# Patient Record
Sex: Female | Born: 1950 | Race: White | Hispanic: No | Marital: Married | Smoking: Never smoker
Health system: Southern US, Community
[De-identification: ages and names within clinical notes are randomized; demographics above are authoritative.]

## PROBLEM LIST (undated history)

## (undated) DIAGNOSIS — M810 Age-related osteoporosis without current pathological fracture: Secondary | ICD-10-CM

## (undated) DIAGNOSIS — N3941 Urge incontinence: Secondary | ICD-10-CM

## (undated) DIAGNOSIS — R112 Nausea with vomiting, unspecified: Secondary | ICD-10-CM

## (undated) DIAGNOSIS — K222 Esophageal obstruction: Secondary | ICD-10-CM

## (undated) DIAGNOSIS — K219 Gastro-esophageal reflux disease without esophagitis: Secondary | ICD-10-CM

## (undated) DIAGNOSIS — H269 Unspecified cataract: Secondary | ICD-10-CM

## (undated) DIAGNOSIS — D649 Anemia, unspecified: Secondary | ICD-10-CM

## (undated) DIAGNOSIS — F411 Generalized anxiety disorder: Secondary | ICD-10-CM

## (undated) DIAGNOSIS — I498 Other specified cardiac arrhythmias: Secondary | ICD-10-CM

## (undated) DIAGNOSIS — M722 Plantar fascial fibromatosis: Secondary | ICD-10-CM

## (undated) DIAGNOSIS — Z9889 Other specified postprocedural states: Secondary | ICD-10-CM

## (undated) HISTORY — DX: Unspecified cataract: H26.9

## (undated) HISTORY — DX: Generalized anxiety disorder: F41.1

## (undated) HISTORY — PX: HERNIA REPAIR: SHX51

## (undated) HISTORY — DX: Other specified cardiac arrhythmias: I49.8

## (undated) HISTORY — DX: Urge incontinence: N39.41

## (undated) HISTORY — DX: Gastro-esophageal reflux disease without esophagitis: K21.9

## (undated) HISTORY — PX: VAGINAL HYSTERECTOMY: SUR661

## (undated) HISTORY — DX: Esophageal obstruction: K22.2

## (undated) HISTORY — DX: Plantar fascial fibromatosis: M72.2

## (undated) HISTORY — DX: Anemia, unspecified: D64.9

---

## 1998-08-09 ENCOUNTER — Emergency Department (HOSPITAL_COMMUNITY): Admission: EM | Admit: 1998-08-09 | Discharge: 1998-08-09 | Payer: Self-pay | Admitting: Emergency Medicine

## 1998-08-25 ENCOUNTER — Encounter: Payer: Self-pay | Admitting: *Deleted

## 1998-08-25 ENCOUNTER — Ambulatory Visit (HOSPITAL_COMMUNITY): Admission: RE | Admit: 1998-08-25 | Discharge: 1998-08-25 | Payer: Self-pay | Admitting: *Deleted

## 1998-12-14 ENCOUNTER — Other Ambulatory Visit: Admission: RE | Admit: 1998-12-14 | Discharge: 1998-12-14 | Payer: Self-pay | Admitting: *Deleted

## 1999-04-05 ENCOUNTER — Ambulatory Visit (HOSPITAL_BASED_OUTPATIENT_CLINIC_OR_DEPARTMENT_OTHER): Admission: RE | Admit: 1999-04-05 | Discharge: 1999-04-05 | Payer: Self-pay | Admitting: Surgery

## 1999-09-13 ENCOUNTER — Encounter: Payer: Self-pay | Admitting: *Deleted

## 1999-09-13 ENCOUNTER — Encounter: Admission: RE | Admit: 1999-09-13 | Discharge: 1999-09-13 | Payer: Self-pay | Admitting: *Deleted

## 1999-12-24 ENCOUNTER — Other Ambulatory Visit: Admission: RE | Admit: 1999-12-24 | Discharge: 1999-12-24 | Payer: Self-pay | Admitting: *Deleted

## 2000-09-03 ENCOUNTER — Encounter: Payer: Self-pay | Admitting: *Deleted

## 2000-09-03 ENCOUNTER — Encounter: Admission: RE | Admit: 2000-09-03 | Discharge: 2000-09-03 | Payer: Self-pay | Admitting: *Deleted

## 2000-12-09 ENCOUNTER — Encounter: Payer: Self-pay | Admitting: *Deleted

## 2000-12-09 ENCOUNTER — Encounter: Admission: RE | Admit: 2000-12-09 | Discharge: 2000-12-09 | Payer: Self-pay | Admitting: *Deleted

## 2000-12-09 ENCOUNTER — Other Ambulatory Visit: Admission: RE | Admit: 2000-12-09 | Discharge: 2000-12-09 | Payer: Self-pay | Admitting: *Deleted

## 2001-09-08 ENCOUNTER — Encounter: Admission: RE | Admit: 2001-09-08 | Discharge: 2001-09-08 | Payer: Self-pay | Admitting: *Deleted

## 2001-09-08 ENCOUNTER — Encounter: Payer: Self-pay | Admitting: *Deleted

## 2001-12-01 ENCOUNTER — Other Ambulatory Visit: Admission: RE | Admit: 2001-12-01 | Discharge: 2001-12-01 | Payer: Self-pay | Admitting: *Deleted

## 2002-02-12 ENCOUNTER — Ambulatory Visit (HOSPITAL_BASED_OUTPATIENT_CLINIC_OR_DEPARTMENT_OTHER): Admission: RE | Admit: 2002-02-12 | Discharge: 2002-02-12 | Payer: Self-pay | Admitting: Surgery

## 2002-09-21 ENCOUNTER — Encounter: Payer: Self-pay | Admitting: *Deleted

## 2002-09-21 ENCOUNTER — Encounter: Admission: RE | Admit: 2002-09-21 | Discharge: 2002-09-21 | Payer: Self-pay | Admitting: *Deleted

## 2002-12-14 ENCOUNTER — Other Ambulatory Visit: Admission: RE | Admit: 2002-12-14 | Discharge: 2002-12-14 | Payer: Self-pay | Admitting: *Deleted

## 2003-10-27 ENCOUNTER — Encounter: Admission: RE | Admit: 2003-10-27 | Discharge: 2003-10-27 | Payer: Self-pay | Admitting: *Deleted

## 2003-12-06 ENCOUNTER — Other Ambulatory Visit: Admission: RE | Admit: 2003-12-06 | Discharge: 2003-12-06 | Payer: Self-pay | Admitting: *Deleted

## 2004-12-12 ENCOUNTER — Other Ambulatory Visit: Admission: RE | Admit: 2004-12-12 | Discharge: 2004-12-12 | Payer: Self-pay | Admitting: *Deleted

## 2004-12-17 ENCOUNTER — Ambulatory Visit (HOSPITAL_COMMUNITY): Admission: RE | Admit: 2004-12-17 | Discharge: 2004-12-17 | Payer: Self-pay | Admitting: *Deleted

## 2005-02-21 ENCOUNTER — Ambulatory Visit: Payer: Self-pay | Admitting: Internal Medicine

## 2005-03-14 ENCOUNTER — Ambulatory Visit: Payer: Self-pay | Admitting: Internal Medicine

## 2005-03-14 ENCOUNTER — Ambulatory Visit: Payer: Self-pay

## 2005-03-14 HISTORY — PX: OTHER SURGICAL HISTORY: SHX169

## 2005-06-10 ENCOUNTER — Encounter (INDEPENDENT_AMBULATORY_CARE_PROVIDER_SITE_OTHER): Payer: Self-pay | Admitting: *Deleted

## 2005-06-10 ENCOUNTER — Observation Stay (HOSPITAL_COMMUNITY): Admission: RE | Admit: 2005-06-10 | Discharge: 2005-06-11 | Payer: Self-pay | Admitting: *Deleted

## 2006-01-29 ENCOUNTER — Other Ambulatory Visit: Admission: RE | Admit: 2006-01-29 | Discharge: 2006-01-29 | Payer: Self-pay | Admitting: *Deleted

## 2006-01-29 ENCOUNTER — Ambulatory Visit (HOSPITAL_COMMUNITY): Admission: RE | Admit: 2006-01-29 | Discharge: 2006-01-29 | Payer: Self-pay | Admitting: *Deleted

## 2006-03-19 ENCOUNTER — Ambulatory Visit: Payer: Self-pay | Admitting: Internal Medicine

## 2006-10-21 ENCOUNTER — Ambulatory Visit: Payer: Self-pay | Admitting: Internal Medicine

## 2006-11-25 LAB — CONVERTED CEMR LAB: Pap Smear: NORMAL

## 2007-02-02 ENCOUNTER — Encounter: Admission: RE | Admit: 2007-02-02 | Discharge: 2007-02-02 | Payer: Self-pay | Admitting: *Deleted

## 2007-04-15 ENCOUNTER — Other Ambulatory Visit: Admission: RE | Admit: 2007-04-15 | Discharge: 2007-04-15 | Payer: Self-pay | Admitting: *Deleted

## 2007-04-26 HISTORY — PX: OTHER SURGICAL HISTORY: SHX169

## 2007-05-18 ENCOUNTER — Ambulatory Visit: Payer: Self-pay | Admitting: Internal Medicine

## 2007-05-18 LAB — CONVERTED CEMR LAB
BUN: 16 mg/dL (ref 6–23)
Basophils Relative: 0.1 % (ref 0.0–1.0)
CO2: 31 meq/L (ref 19–32)
GFR calc Af Amer: 95 mL/min
Glucose, Bld: 95 mg/dL (ref 70–99)
Hemoglobin: 13 g/dL (ref 12.0–15.0)
Lymphocytes Relative: 35.4 % (ref 12.0–46.0)
Monocytes Absolute: 0.5 10*3/uL (ref 0.2–0.7)
Monocytes Relative: 8.7 % (ref 3.0–11.0)
Neutro Abs: 2.9 10*3/uL (ref 1.4–7.7)
Potassium: 4.1 meq/L (ref 3.5–5.1)
Prothrombin Time: 11.1 s (ref 10.0–14.0)

## 2007-05-21 ENCOUNTER — Ambulatory Visit (HOSPITAL_COMMUNITY): Admission: RE | Admit: 2007-05-21 | Discharge: 2007-05-22 | Payer: Self-pay | Admitting: Internal Medicine

## 2007-05-21 ENCOUNTER — Ambulatory Visit: Payer: Self-pay | Admitting: Internal Medicine

## 2007-07-15 ENCOUNTER — Ambulatory Visit: Payer: Self-pay | Admitting: Internal Medicine

## 2008-02-16 ENCOUNTER — Encounter: Admission: RE | Admit: 2008-02-16 | Discharge: 2008-02-16 | Payer: Self-pay | Admitting: *Deleted

## 2008-10-03 ENCOUNTER — Ambulatory Visit: Payer: Self-pay | Admitting: Internal Medicine

## 2008-10-17 ENCOUNTER — Ambulatory Visit: Payer: Self-pay | Admitting: Internal Medicine

## 2008-10-17 LAB — HM COLONOSCOPY

## 2009-02-17 DIAGNOSIS — I498 Other specified cardiac arrhythmias: Secondary | ICD-10-CM

## 2009-02-17 DIAGNOSIS — Z9189 Other specified personal risk factors, not elsewhere classified: Secondary | ICD-10-CM | POA: Insufficient documentation

## 2009-03-01 ENCOUNTER — Ambulatory Visit: Payer: Self-pay | Admitting: Internal Medicine

## 2009-03-01 DIAGNOSIS — J019 Acute sinusitis, unspecified: Secondary | ICD-10-CM

## 2009-03-01 DIAGNOSIS — N3941 Urge incontinence: Secondary | ICD-10-CM

## 2009-06-06 ENCOUNTER — Telehealth: Payer: Self-pay | Admitting: Internal Medicine

## 2009-06-14 ENCOUNTER — Encounter: Admission: RE | Admit: 2009-06-14 | Discharge: 2009-06-14 | Payer: Self-pay | Admitting: Internal Medicine

## 2009-06-14 ENCOUNTER — Encounter: Payer: Self-pay | Admitting: Internal Medicine

## 2009-08-30 ENCOUNTER — Ambulatory Visit: Payer: Self-pay | Admitting: Internal Medicine

## 2009-08-30 LAB — CONVERTED CEMR LAB
ALT: 26 units/L (ref 0–35)
AST: 25 units/L (ref 0–37)
Albumin: 3.9 g/dL (ref 3.5–5.2)
Alkaline Phosphatase: 62 units/L (ref 39–117)
BUN: 16 mg/dL (ref 6–23)
Bilirubin Urine: NEGATIVE
CO2: 31 meq/L (ref 19–32)
Calcium: 9.2 mg/dL (ref 8.4–10.5)
Creatinine, Ser: 0.8 mg/dL (ref 0.4–1.2)
Eosinophils Relative: 3.5 % (ref 0.0–5.0)
Glucose, Bld: 94 mg/dL (ref 70–99)
HCT: 41.5 % (ref 36.0–46.0)
Hemoglobin: 14 g/dL (ref 12.0–15.0)
Ketones, ur: NEGATIVE mg/dL
Leukocytes, UA: NEGATIVE
Lymphs Abs: 1.9 10*3/uL (ref 0.7–4.0)
Monocytes Relative: 10.6 % (ref 3.0–12.0)
Neutro Abs: 2.4 10*3/uL (ref 1.4–7.7)
RBC: 4.34 M/uL (ref 3.87–5.11)
TSH: 1 microintl units/mL (ref 0.35–5.50)
Total CHOL/HDL Ratio: 3
Urine Glucose: NEGATIVE mg/dL
WBC: 5 10*3/uL (ref 4.5–10.5)
pH: 6 (ref 5.0–8.0)

## 2009-09-06 ENCOUNTER — Ambulatory Visit: Payer: Self-pay | Admitting: Internal Medicine

## 2009-09-06 DIAGNOSIS — F411 Generalized anxiety disorder: Secondary | ICD-10-CM | POA: Insufficient documentation

## 2009-09-06 DIAGNOSIS — M722 Plantar fascial fibromatosis: Secondary | ICD-10-CM | POA: Insufficient documentation

## 2010-01-23 ENCOUNTER — Ambulatory Visit: Payer: Self-pay | Admitting: Internal Medicine

## 2010-06-15 ENCOUNTER — Ambulatory Visit: Payer: Self-pay | Admitting: Internal Medicine

## 2010-06-15 DIAGNOSIS — R609 Edema, unspecified: Secondary | ICD-10-CM | POA: Insufficient documentation

## 2010-06-15 DIAGNOSIS — IMO0001 Reserved for inherently not codable concepts without codable children: Secondary | ICD-10-CM | POA: Insufficient documentation

## 2010-06-18 LAB — CONVERTED CEMR LAB
ALT: 24 units/L (ref 0–35)
AST: 27 units/L (ref 0–37)
Albumin: 3.9 g/dL (ref 3.5–5.2)
Alkaline Phosphatase: 62 units/L (ref 39–117)
BUN: 15 mg/dL (ref 6–23)
Bilirubin Urine: NEGATIVE
Chloride: 108 meq/L (ref 96–112)
Creatinine, Ser: 0.8 mg/dL (ref 0.4–1.2)
Glucose, Bld: 121 mg/dL — ABNORMAL HIGH (ref 70–99)
Hemoglobin, Urine: NEGATIVE
Nitrite: NEGATIVE
TSH: 0.77 microintl units/mL (ref 0.35–5.50)
Total Protein: 6.3 g/dL (ref 6.0–8.3)
Urobilinogen, UA: 0.2 (ref 0.0–1.0)
pH: 6 (ref 5.0–8.0)

## 2010-10-03 ENCOUNTER — Ambulatory Visit: Payer: Self-pay | Admitting: Internal Medicine

## 2010-10-23 ENCOUNTER — Telehealth: Payer: Self-pay | Admitting: Internal Medicine

## 2010-10-25 HISTORY — PX: EYE SURGERY: SHX253

## 2010-11-09 ENCOUNTER — Encounter
Admission: RE | Admit: 2010-11-09 | Discharge: 2010-11-09 | Payer: Self-pay | Source: Home / Self Care | Attending: Internal Medicine | Admitting: Internal Medicine

## 2010-11-09 LAB — HM MAMMOGRAPHY

## 2010-12-15 ENCOUNTER — Encounter: Payer: Self-pay | Admitting: *Deleted

## 2010-12-25 NOTE — Assessment & Plan Note (Signed)
Summary: ZOSTAVAX/ VAL/ SAYS INSURANCE WILL PAY/ NWS   Nurse Visit   Allergies: No Known Drug Allergies  Immunizations Administered:  Zostavax # 1:    Vaccine Type: Zostavax    Site: left deltoid    Mfr: Merck    Dose: 0.5 ml    Route: [place]    Given by: Lucious Groves    Exp. Date: 12/22/2010    Lot #: [id number]    VIS given: 09/06/05 given January 23, 2010.  Orders Added: 1)  Zoster (Shingles) Vaccine Live [90736] 2)  Admin 1st Vaccine [phone]

## 2010-12-25 NOTE — Assessment & Plan Note (Signed)
Summary: SINUS/NWS   Vital Signs:  Patient profile:   60 year old female Height:      65 inches (165.10 cm) Weight:      161 pounds (73.18 kg) O2 Sat:      97 % on Room air Temp:     98.1 degrees F (36.72 degrees C) oral Pulse rate:   85 / minute BP sitting:   120 / 82  (left arm) Cuff size:   regular  Vitals Entered By: Orlan Leavens RMA (October 03, 2010 2:49 PM)  O2 Flow:  Room air CC: Sinus problem, URI symptoms Is Patient Diabetic? No Pain Assessment Patient in pain? no        Primary Care Provider:  Newt Lukes MD  CC:  Sinus problem and URI symptoms.  History of Present Illness:  URI Symptoms      This is a 60 year old woman who presents with URI symptoms.  The symptoms began 2 days ago.  The severity is described as moderate.  hx recurrent sinus problems and infx in past.  The patient reports sore throat and dry cough, but denies productive cough and earache.  Associated symptoms include wheezing.  The patient denies fever, dyspnea, rash, vomiting, and diarrhea.  The patient denies headache, muscle aches, and severe fatigue.  The patient denies the following risk factors for Strep sinusitis: tooth pain, Strep exposure, and tender adenopathy.    also requests med for urge incontinence (OAB) -  prior trial detrol ineffective  Clinical Review Panels:  CBC   WBC:  5.0 (08/30/2009)   RBC:  4.34 (08/30/2009)   Hgb:  14.0 (08/30/2009)   Hct:  41.5 (08/30/2009)   Platelets:  212.0 (08/30/2009)   MCV  95.6 (08/30/2009)   MCHC  33.7 (08/30/2009)   RDW  12.3 (08/30/2009)   PMN:  47.0 (08/30/2009)   Lymphs:  38.7 (08/30/2009)   Monos:  10.6 (08/30/2009)   Eosinophils:  3.5 (08/30/2009)   Basophil:  0.2 (08/30/2009)  Complete Metabolic Panel   Glucose:  121 (06/15/2010)   Sodium:  146 (06/15/2010)   Potassium:  4.3 (06/15/2010)   Chloride:  108 (06/15/2010)   CO2:  30 (06/15/2010)   BUN:  15 (06/15/2010)   Creatinine:  0.8 (06/15/2010)   Albumin:  3.9  (06/15/2010)   Total Protein:  6.3 (06/15/2010)   Calcium:  9.4 (06/15/2010)   Total Bili:  0.5 (06/15/2010)   Alk Phos:  62 (06/15/2010)   SGPT (ALT):  24 (06/15/2010)   SGOT (AST):  27 (06/15/2010)   Current Medications (verified): 1)  Multivitamins  Tabs (Multiple Vitamin) .... Take 1 By Mouth Qd 2)  Vitamin C 500 Mg Tabs (Ascorbic Acid) .... Take 1 By Mouth Qd 3)  Calcium 500 Mg Tabs (Calcium Carbonate) .... Take 2 By Mouth Qd 4)  B Complex 100  Tabs (B Complex Vitamins) .... 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    Physical Exam  General:  alert, well-developed, well-nourished, and cooperative to examination.    Eyes:  vision grossly intact; pupils equal, round and reactive to light.  conjunctiva and lids normal.    Ears:  normal pinnae bilaterally, without erythema, swelling, or tenderness to palpation.  B TMs hazy but without erythema or effusion, no cerumen impaction. Hearing grossly normal bilaterally  Nose:  mildly tender to frontal sinus palp Mouth:  good dentition, pharynx pink and moist, no erythema, no exudates, and no postnasal drip.   Lungs:  Normal respiratory effort, chest expands symmetrically. Lungs are clear to auscultation, no crackles or wheezes. Heart:  normal rate, regular rhythm, no murmur, and no rub. BLE mild, trace symmetrical edema at ankles only.   Impression & Recommendations:  Problem # 1:  ACUTE SINUSITIS, UNSPECIFIED (ICD-461.9)  steroid shot for inflammation and resume nasal steroid - hold abx at this time but call if symptoms worse  Her updated medication list for this problem includes:    Nasonex 50 Mcg/act Susp (Mometasone  furoate) .Marland Kitchen... 2 sprays each nostril  every morning  Instructed on treatment. Call if symptoms persist or worsen.   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 Take 1 by mouth qd 2)  Vitamin C 500 Mg Tabs (Ascorbic acid) .... Take 1 by mouth qd 3)  Calcium 500 Mg Tabs (Calcium carbonate) .... Take 2 by mouth qd 4)  B Complex 100 Tabs (B complex vitamins) .... Take 1 by mouth once daily 5)  Nasonex 50 Mcg/act Susp (Mometasone furoate) .... 2 sprays each nostril  every morning 6)  Toviaz 4 Mg Xr24h-tab (Fesoterodine fumarate) .Marland Kitchen.. 1 by mouth once daily  Patient Instructions: 1)  it was good to see you today. 2)  depomedrol shot and resume nasal spray for sinus symptoms  - your prescription has been electronically submitted to your pharmacy. Please take as directed. Contact our office if you believe you're having problems with the medication(s).  3)  if you develop worsening symptoms or fever, call us and we can reconsider antibiotics but it does not appear necessary to use any anitbiotic at this time  4)  try Toviaz for bladder symptoms - 2 week sample given - call if you'd like prescription for same 5)  Please schedule a follow-up appointment as needed. Prescriptions: NASONEX 50 MCG/ACT SUSP (MOMETASONE FUROATE) 2 sprays each nostril  every morning  #1 x 3   Entered and Authorized by:   Newt Lukes MD   Signed by:   Newt Lukes MD on 10/03/2010   Method used:   Electronically to        [id number] (retail)       [address]       [place], [place]  [address]       Ph: [phone]        Fax: [phone]   RxID:   [id number]    Medication Administration  Injection # 1:    Medication: Depo- Medrol 80mg     Diagnosis: ACUTE SINUSITIS, UNSPECIFIED (ICD-461.9)    Route: IM    Site: LUOQ gluteus    Exp Date: 03/2013    Lot #: [id number]    Mfr: Pharmacia    Patient tolerated injection without complications    Given by: Orlan Leavens RMA (October 03, 2010 3:26 PM)  Orders Added: 1)  Depo- Medrol 80mg  [J1040] 2)  Admin  of Therapeutic Inj  intramuscular or subcutaneous [96372] 3)  Est. Patient Level IV [16109] 4)  Prescription Created Electronically [id number]

## 2010-12-25 NOTE — Assessment & Plan Note (Signed)
Summary: pain base of neck/#/cd   Vital Signs:  Patient profile:   60 year old female Height:      65 inches (165.10 cm) Weight:      161.0 pounds (73.18 kg) BMI:     26.89 O2 Sat:      97 % on Room air Temp:     97.9 degrees F (36.61 degrees C) oral Pulse rate:   84 / minute BP sitting:   110 / 82  (left arm) Cuff size:   regular  Vitals Entered By: Orlan Leavens RMA (June 15, 2010 2:49 PM)  O2 Flow:  Room air CC: pain in base of her neck Is Patient Diabetic? No Pain Assessment Patient in pain? yes     Location: neck Type: aching Comments Pt also complaining of swollen ankles   Primary Care Provider:  Newt Lukes MD  CC:  pain in base of her neck.  History of Present Illness: c/o pain at base of neck - onset 10d ago - precipitated by stress - work/family no trauma or injury - no radiation into arms of pain or numbnesss,  no weakness in hands or balance problems  also c/o edema - ntermittent episodes 1-2x/week - feet only, bilaterally affected when present - improve with elevation legs (but takes 1-2 days) not related to salt intake or NSAID use - no med changes  Clinical Review Panels:  CBC   WBC:  5.0 (08/30/2009)   RBC:  4.34 (08/30/2009)   Hgb:  14.0 (08/30/2009)   Hct:  41.5 (08/30/2009)   Platelets:  212.0 (08/30/2009)   MCV  95.6 (08/30/2009)   MCHC  33.7 (08/30/2009)   RDW  12.3 (08/30/2009)   PMN:  47.0 (08/30/2009)   Lymphs:  38.7 (08/30/2009)   Monos:  10.6 (08/30/2009)   Eosinophils:  3.5 (08/30/2009)   Basophil:  0.2 (08/30/2009)  Complete Metabolic Panel   Glucose:  94 (08/30/2009)   Sodium:  143 (08/30/2009)   Potassium:  4.7 (08/30/2009)   Chloride:  108 (08/30/2009)   CO2:  31 (08/30/2009)   BUN:  16 (08/30/2009)   Creatinine:  0.8 (08/30/2009)   Albumin:  3.9 (08/30/2009)   Total Protein:  6.9 (08/30/2009)   Calcium:  9.2 (08/30/2009)   Total Bili:  0.9 (08/30/2009)   Alk Phos:  62 (08/30/2009)   SGPT (ALT):  26  (08/30/2009)   SGOT (AST):  25 (08/30/2009)   Current Medications (verified): 1)  Multivitamins  Tabs (Multiple Vitamin) .... Take 1 By Mouth Qd 2)  Estradiol 2 Mg Tabs (Estradiol) .... Take 1/2  Twice A Week 3)  Allegra 180 Mg Tabs (Fexofenadine Hcl) .... Take 1/2 By Mouth Prn 4)  Vitamin C 500 Mg Tabs (Ascorbic Acid) .... Take 1 By Mouth Qd 5)  Calcium 500 Mg Tabs (Calcium Carbonate) .... Take 2 By Mouth Qd 6)  Alprazolam 0.25 Mg Tabs (Alprazolam) .... Take 1/2-1 By Mouth Once Daily Prn 7)  Fluticasone Propionate 50 Mcg/act Susp (Fluticasone Propionate) .Marland Kitchen.. 1 Spray Each Nostril  Every Morning  Allergies (verified): No Known Drug Allergies  Past History:  Past Medical History: sinus problems plantar fascitis L>R  Social History: Marital Status: Married Children: none - stepdtr and g-children Occupation: accounting softward support  Review of Systems  The patient denies fever, weight loss, weight gain, chest pain, syncope, dyspnea on exertion, headaches, hemoptysis, severe indigestion/heartburn, incontinence, and difficulty walking.    Physical Exam  General:  alert, well-developed, well-nourished, and cooperative to examination.  Lungs:  Normal respiratory effort, chest expands symmetrically. Lungs are clear to auscultation, no crackles or wheezes. Heart:  normal rate, regular rhythm, no murmur, and no rub. BLE mild, trace symmetrical edema at ankles only- Abdomen:  soft, non-tender, normal bowel sounds, no distention; no masses and no appreciable hepatomegaly or splenomegaly.   Msk:  +myofascial tenderness along trapezius at base of neck - tyupical "trigger point" sensitivity - FROM neck and shoulders Neurologic:  alert & oriented X3 and cranial nerves II-XII symetrically intact.  strength normal in all extremities, sensation intact to light touch, and gait normal. speech fluent without dysarthria or aphasia; follows commands with good comprehension.    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 1 by mouth three times a day as needed for muscle spasm pain  Orders: Prescription Created Electronically [phone])  Problem # 2:  EDEMA (ICD-782.3) r/o med dz with labs though appears dependant edema related to weather changes - min today - hold diureytic tx - considered NSAID-induced and advised to watch for same but pt uses minimal ibuprofen (see above) Orders: TLB-BMP (Basic Metabolic Panel-BMET) (80048-METABOL) TLB-TSH (Thyroid Stimulating Hormone) (84443-TSH) TLB-Hepatic/Liver Function Pnl (80076-HEPATIC) TLB-Udip ONLY (81003-UDIP)  Discussed elevation of the legs, use of compression stockings, sodium restiction, and medication use.   Complete Medication List: 1)  Multivitamins Tabs (Multiple vitamin) .... Take 1 by mouth qd 2)  Allegra 180 Mg Tabs (Fexofenadine hcl) .... Take 1/2 by mouth prn 3)  Vitamin C 500 Mg Tabs (Ascorbic acid) .... Take 1 by mouth qd 4)  Calcium 500 Mg Tabs (Calcium carbonate) .... Take 2 by mouth qd 5)  Alprazolam 0.25 Mg Tabs (Alprazolam) .... Take 1/2-1 by mouth once daily prn 6)  Robaxin-750 750 Mg Tabs (Methocarbamol) .Marland Kitchen.. 1 by mouth three times a day as needed for muscle spasm pain  Patient Instructions: 1)  it was good to see you today. 2)  use robaxin for muscle spasm pain in your neck/low back/shoulder as discussed -your prescription has been electronically submitted to your pharmacy. Please take as directed. Contact our office if you believe you're having problems with the medication(s).  3)  ok to use Advil as needed or to get a massage! 4)  test(s) ordered today  for your swelling - your results will be posted on the phone tree for review in 48-72 hours from the time of test completion; call [phone] and enter your 9 digit MRN (listed above on this page, just below your name); if any changes need to be made or there are abnormal results, you will be contacted directly.  5)  Please schedule a follow-up appointment as needed (or as previously scheduled) Prescriptions: ROBAXIN-750 750 MG TABS (METHOCARBAMOL) 1 by mouth three times a day as needed for muscle spasm pain  #40 x 1   Entered and Authorized by:   Newt Lukes MD   Signed by:   Newt Lukes MD on 06/15/2010   Method used:   Electronically to        [facility] [address] [phone]* (retail)       [address]       [place]  [place], [place]  [address]       Ph: [phone] or [phone]       Fax: [phone]   RxID:   [id number]

## 2010-12-25 NOTE — Progress Notes (Signed)
Summary: Rx req  Phone Note Call from Patient Call back at Work Phone [phone]   Caller: Patient [phone] Summary of Call: Pt called stating samples fo Toviaz worked well for bladder sxs. Pt is requesting Rx to [facility] on [address]. Initial call taken by: Margaret Pyle, CMA,  October 23, 2010 10:03 AM  Follow-up for Phone Call        ok -erx done Follow-up by: Newt Lukes MD,  October 23, 2010 11:21 AM    Prescriptions: TOVIAZ 4 MG XR24H-TAB (FESOTERODINE FUMARATE) 1 by mouth once daily  #30 x 6   Entered and Authorized by:   Newt Lukes MD   Signed by:   Newt Lukes MD on 10/23/2010   Method used:   Electronically to        [id number] (retail)       [address]       [place], [place]  [address]       Ph: [phone]       Fax: [phone]   RxID:   [id number]

## 2011-02-12 ENCOUNTER — Telehealth: Payer: Self-pay

## 2011-02-12 DIAGNOSIS — T753XXA Motion sickness, initial encounter: Secondary | ICD-10-CM

## 2011-02-12 MED ORDER — SCOPOLAMINE 1 MG/3DAYS TD PT72: 1.0000 | MEDICATED_PATCH | TRANSDERMAL | Status: DC

## 2011-02-12 NOTE — Telephone Encounter (Signed)
Pt called requesting Rx for motion sickness patches and nurse visit for flu shot. Please advise

## 2011-02-12 NOTE — Telephone Encounter (Signed)
Ok - will prescribe scopolamine patch - ok to sched nurse visit for flu shot as requested

## 2011-03-15 ENCOUNTER — Encounter: Payer: Self-pay | Admitting: Internal Medicine

## 2011-03-15 ENCOUNTER — Other Ambulatory Visit (INDEPENDENT_AMBULATORY_CARE_PROVIDER_SITE_OTHER): Payer: BC Managed Care – PPO

## 2011-03-15 ENCOUNTER — Ambulatory Visit (INDEPENDENT_AMBULATORY_CARE_PROVIDER_SITE_OTHER): Payer: BC Managed Care – PPO | Admitting: Internal Medicine

## 2011-03-15 VITALS — BP 136/82 | HR 61 | Temp 98.4°F | Resp 16

## 2011-03-15 DIAGNOSIS — R51 Headache: Secondary | ICD-10-CM

## 2011-03-15 DIAGNOSIS — R5383 Other fatigue: Secondary | ICD-10-CM | POA: Insufficient documentation

## 2011-03-15 DIAGNOSIS — R5381 Other malaise: Secondary | ICD-10-CM

## 2011-03-15 DIAGNOSIS — R519 Headache, unspecified: Secondary | ICD-10-CM | POA: Insufficient documentation

## 2011-03-15 LAB — CBC WITH DIFFERENTIAL/PLATELET
Basophils Absolute: 0 10*3/uL (ref 0.0–0.1)
HCT: 40.6 % (ref 36.0–46.0)
Hemoglobin: 14 g/dL (ref 12.0–15.0)
Lymphs Abs: 2 10*3/uL (ref 0.7–4.0)
MCHC: 34.5 g/dL (ref 30.0–36.0)
MCV: 94.1 fl (ref 78.0–100.0)
Monocytes Absolute: 0.6 10*3/uL (ref 0.1–1.0)
Monocytes Relative: 10.6 % (ref 3.0–12.0)
Neutro Abs: 2.9 10*3/uL (ref 1.4–7.7)
Platelets: 239 10*3/uL (ref 150.0–400.0)
RDW: 12.7 % (ref 11.5–14.6)

## 2011-03-15 LAB — COMPREHENSIVE METABOLIC PANEL
ALT: 26 U/L (ref 0–35)
Alkaline Phosphatase: 69 U/L (ref 39–117)
CO2: 29 mEq/L (ref 19–32)
Creatinine, Ser: 0.6 mg/dL (ref 0.4–1.2)
GFR: 107.92 mL/min (ref >60.00)
Sodium: 141 mEq/L (ref 135–145)
Total Bilirubin: 0.4 mg/dL (ref 0.3–1.2)
Total Protein: 6.5 g/dL (ref 6.0–8.3)

## 2011-03-15 LAB — SEDIMENTATION RATE: Sed Rate: 21 mm/hr (ref 0–22)

## 2011-03-15 LAB — TSH: TSH: 0.8 u[IU]/mL (ref 0.35–5.50)

## 2011-03-15 NOTE — Progress Notes (Signed)
Subjective:    Sara [name] is a 60 y.o. female who presents for evaluation of headache. Symptoms began about 5 days ago. Generally, the headaches last about several hours and occur continuously. The headaches are usually better during the day. The headaches are usually dull and are located in the frontal areas.  The patient rates her most severe headaches a 1 on a scale from 1 to 10. Recently, the headaches have been decreasing in both severity and frequency. Work attendance or other daily activities are not affected by the headaches. Precipitating factors include: travel to [place]. The headaches are usually not preceded by an aura. Associated neurologic symptoms: nausea. The patient denies decreased physical activity, depression, dizziness, loss of balance, muscle weakness, numbness of extremities, speech difficulties, vision problems, vomiting in the early morning and worsening school/work performance. Home treatment has included nothing with marked improvement. Other history includes: nothing pertinent. Family history includes no known family members with significant headaches.  The following portions of the patient's history were reviewed and updated as appropriate: allergies, current medications, past family history, past medical history, past social history, past surgical history and problem list.  Review of Systems Constitutional: positive for fatigue Musculoskeletal:negative for arthralgias, back pain, bone pain, muscle weakness, myalgias, neck pain and stiff joints Neurological: negative for coordination problems, dizziness, gait problems, memory problems, paresthesia, seizures, speech problems, tremors, vertigo and weakness    Objective:    BP 136/82  Pulse 61  Temp(Src) 98.4 F (36.9 C) (Oral)  Resp 16 General appearance: alert, cooperative, appears stated age and no distress Head: Normocephalic, without obvious abnormality, atraumatic Eyes: conjunctivae/corneas clear.  PERRL, EOM's intact. Fundi benign. Throat: lips, mucosa, and tongue normal; teeth and gums normal Neck: no adenopathy, no carotid bruit, no JVD, supple, symmetrical, trachea midline and thyroid not enlarged, symmetric, no tenderness/mass/nodules Lungs: clear to auscultation bilaterally Heart: regular rate and rhythm, S1, S2 normal, no murmur, click, rub or gallop Abdomen: soft, non-tender; bowel sounds normal; no masses,  no organomegaly Extremities: extremities normal, atraumatic, no cyanosis or edema Pulses: 2+ and symmetric Skin: Skin color, texture, turgor normal. No rashes or lesions Lymph nodes: Cervical, supraclavicular, and axillary nodes normal. Neurologic: Alert and oriented X 3, normal strength and tone. Normal symmetric reflexes. Normal coordination and gait    Assessment:    Encephalitis    Plan:    Lie in darkened room and apply cold packs as needed for pain. Side effect profile discussed in detail. Asked to keep headache diary. Patient reassured that neurodiagnostic workup not indicated from benign H&P. labs to look for other causes such as infection, inflammation, thyroid disease

## 2011-03-15 NOTE — Patient Instructions (Signed)
Headache, General, Without Cause The specific cause of your headache may not have been found today. There are many causes and types of headache. A few common ones are:  Tension headache.  Migraine.   Infections (examples: dental and sinus infections).  Bone and/or joint problems in the neck or jaw.   Depression.  Eye problems.   These headaches are not life threatening.  Headaches can sometimes be diagnosed by a patient history and a physical exam. Sometimes, lab and imaging studies (such as x-ray and/or CT scan) are used to rule out more serious problems. In some cases, a spinal tap (lumbar puncture) may be requested. There are many times when your exam and tests may be normal on the first visit even when there is a serious problem causing your headaches. Because of that, it is very important to follow up with your doctor or local clinic for further evaluation. HOME CARE INSTRUCTIONS  Keep follow-up appointments with your caregiver, or any specialist referral.   Only take over-the-counter or prescription medicines for pain, discomfort, or fever as directed by your caregiver.   Biofeedback, massage, or other relaxation techniques may be helpful.   Ice packs or heat applied to the head and neck can be used. Do this three to four times per day, or as needed.   Call your doctor if you have any questions or concerns.   If you smoke, you should quit.  FINDING OUT THE RESULTS OF TESTS  If a radiology test was performed, a radiologist will review your results.   You will be contacted by the emergency department or your physician if any test results require a change in your treatment plan.   Not all test results may be available during your visit. If your test results are not back during the visit, make an appointment with your caregiver to find out the results. Do not assume everything is normal if you have not heard from your caregiver or the medical facility. It is important for you to  follow up on all of your test results.  SEEK MEDICAL CARE IF:  You develop problems with medications prescribed.   You do not respond to or obtain relief from medications.   You have a change from the usual headache.   You develop nausea or vomiting.  SEEK IMMEDIATE MEDICAL CARE IF:   If your headache becomes severe.   You have an unexplained oral temperature above 100.5, or as your caregiver suggests.   You have a stiff neck.   You have loss of vision.   You have muscular weakness.   You have loss of muscular control.   You develop severe symptoms different from your first symptoms.   You start losing your balance or have trouble walking.   You feel faint or pass out.  MAKE SURE YOU:   Understand these instructions.   Will watch your condition.   Will get help right away if you are not doing well or get worse.  Document Released: 11/11/2005 Document Re-Released: 10/24/2008 Exit[employer] Patient Information 2011 [employer]. 

## 2011-03-15 NOTE — Assessment & Plan Note (Signed)
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

## 2011-03-15 NOTE — Assessment & Plan Note (Signed)
Labs ordered to look for secondary causes 

## 2011-03-27 ENCOUNTER — Ambulatory Visit (INDEPENDENT_AMBULATORY_CARE_PROVIDER_SITE_OTHER): Payer: BC Managed Care – PPO | Admitting: Internal Medicine

## 2011-03-27 ENCOUNTER — Ambulatory Visit: Payer: BC Managed Care – PPO | Admitting: Internal Medicine

## 2011-03-27 ENCOUNTER — Encounter: Payer: Self-pay | Admitting: Internal Medicine

## 2011-03-27 ENCOUNTER — Other Ambulatory Visit: Payer: BC Managed Care – PPO

## 2011-03-27 ENCOUNTER — Other Ambulatory Visit (INDEPENDENT_AMBULATORY_CARE_PROVIDER_SITE_OTHER): Payer: BC Managed Care – PPO | Admitting: Internal Medicine

## 2011-03-27 VITALS — BP 118/72 | HR 80 | Temp 100.5°F

## 2011-03-27 DIAGNOSIS — R509 Fever, unspecified: Secondary | ICD-10-CM

## 2011-03-27 DIAGNOSIS — J329 Chronic sinusitis, unspecified: Secondary | ICD-10-CM

## 2011-03-27 LAB — URINALYSIS, ROUTINE W REFLEX MICROSCOPIC
Hgb urine dipstick: NEGATIVE
Specific Gravity, Urine: 1.025 (ref 1.000–1.030)
Urine Glucose: NEGATIVE
Urobilinogen, UA: 0.2 (ref 0.0–1.0)

## 2011-03-27 MED ORDER — AMOXICILLIN-POT CLAVULANATE 875-125 MG PO TABS: 1.0000 | ORAL_TABLET | Freq: Two times a day (BID) | ORAL | Status: AC

## 2011-03-27 NOTE — Progress Notes (Signed)
  Subjective:    Patient ID: Sara [name], female    DOB: 1951/03/03, 60 y.o.   MRN: [id number]  HPI    Review of Systems     Objective:   Physical Exam        Assessment & Plan:   Subjective:     Sara [name] is a 60 y.o. female who presents for evaluation of sinus pain. Symptoms include: congestion, fevers, frequent clearing of the throat, headaches, sinus pressure and sore throat. Onset of symptoms was 5 days ago. Symptoms have been unchanged since that time. Past history is significant for no history of pneumonia or bronchitis. Patient is a non-smoker.  The following portions of the patient's history were reviewed and updated as appropriate: allergies, current medications, past family history, past medical history, past social history and problem list.  Review of Systems Constitutional: positive for fevers and malaise, negative for sweats Respiratory: negative Cardiovascular: negative Gastrointestinal: negative   Objective:    BP 118/72  Pulse 80  Temp(Src) 100.5 F (38.1 C) (Oral)  SpO2 95% General appearance: alert and no distress Head: Normocephalic, without obvious abnormality, atraumatic, sinuses tender to percussion Eyes: negative Ears: normal TM's and external ear canals both ears Nose: Nares normal. Septum midline. Mucosa normal. No drainage or sinus tenderness. Throat: lips, mucosa, and tongue normal; teeth and gums normal Neck: mild anterior cervical adenopathy, no carotid bruit, no JVD, supple, symmetrical, trachea midline and thyroid not enlarged, symmetric, no tenderness/mass/nodules Lungs: clear to auscultation bilaterally Heart: regular rate and rhythm, S1, S2 normal, no murmur, click, rub or gallop    Assessment:    Acute atypical sinusitis.    Plan:    Nasal saline sprays. Nasal steroids per medication orders. Antihistamines per medication orders. Zithromax per medication orders.

## 2011-03-27 NOTE — Patient Instructions (Signed)
It was good to see you today. Suspect sinus infection causing fever but will also check urine for bladder infection - you will be called with these results once available Augmentin antibiotics - continue tylenol or ibuprofen for fever control -Your prescription(s) have been submitted to your pharmacy. Please take as directed and contact our office if you believe you are having problem(s) with the medication(s).

## 2011-04-09 NOTE — Assessment & Plan Note (Signed)
[facility]                         ELECTROPHYSIOLOGY OFFICE NOTE   Sara [name], Sara [name]                      MRN:          [id number]  DATE:07/15/2007                            DOB:          1950-12-07    [name] is seen following slow pathway modification and she also had a  persistent junctional tachycardia which seems to have abated.  She is  feeling terrific at this point without current tachy palpitations.  She  has had some starts, makes her feel like she is going to have an episode  but nothing has followed.   EXAMINATION TODAY:  Her blood pressure is 116/80, her pulse was 60.  LUNGS:  Clear.  HEART SOUNDS:  Regular.  EXTREMITIES:  Without edema.   Electrocardiogram was normal.   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   We will be glad to see her as needed.  Otherwise, she will follow up  with Dr. Artis Flock.     Duke Salvia, MD, [facility]  Electronically Signed    SCK/MedQ  DD: 07/15/2007  DT: 07/16/2007  Job #: [id number]   cc:   Quita Skye. Artis Flock, M.D.

## 2011-04-09 NOTE — Discharge Summary (Signed)
Sara [name], Sara [name] NO.:  [id number]   MEDICAL RECORD NO.:  [id number]          PATIENT TYPE:  OIB   LOCATION:  3733                         FACILITY:  [facility]   PHYSICIAN:  Duke Salvia, MD, FACCDATE OF BIRTH:  Oct 24, 1951   DATE OF ADMISSION:  05/21/2007  DATE OF DISCHARGE:                               DISCHARGE SUMMARY   This patient has allergy to CODEINE.   This dictation greater than 35 minutes.   FINAL DIAGNOSIS:  Two tachydysrhythmias:  1. Atrioventricular nodal reentry tachycardia.  2. A junctional tachycardia.   SECONDARY DIAGNOSES:  1. Discharging day 31 status post electrophysiology study,      radiofrequency catheter ablation of atrioventricular nodal reentry      tachycardia.  Also noted was a junctional tachycardia near the      atrioventricular node which was not ablated.  2. Prolonged PR interval after ablation, which is now normalized on      the morning of day #1.   PROCEDURE:  May 21, 2007:  Electrophysiology study with successful  radiofrequency catheter ablation of AVNRT with slow P-wave modification.  Also noted was a junctional tachycardia near the AV node which was not  ablated - Dr. Sherryl Manges.   BRIEF HISTORY:  Ms. Faust is a 60 year old female.  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  She has been seen by  Dr. Graciela Husbands for 12 years.  She has episodes that cannot terminate.  They  last 10-12 hours and are associated with significant fatigue.  She  apparently has two separate sources of supraventricular tachycardia, one  which is associated with significant fatigue and heart palpitations, the  other which just represents a sensation of fast heart rate.  The patient  would like to stop taking verapamil and have an ablation procedure.  The  benefits and risks have been reviewed with the patient and she wishes to  proceed.   HOSPITAL COURSE:  The patient presented electively May 21, 2007.  She  underwent electrophysiology study with catheter ablation of her AV nodal  reentry tachycardia.  As noted above, she had a second tachycardia  focus.  This was not ablated.  A period of watchful-waiting is in order  and if the patient has recurrent tachycardias she will probably have s  referral to a center where they do cryoablations.  The patient  discharged on her following medications:  1. Verapamil 180 mg one-half tablet daily.  2. Estradiol one-half tablet daily.  3. Advil daily.   The patient has followup with Selena Batten, the [facility] office,  with Dr. Graciela Husbands on Wednesday, August 20 at 4 p.m.  She is to call our  office if she has recurrence of her tachycardia.   LABORATORY STUDIES THIS ADMISSION:  White cells 5.5, hemoglobin 12.5,  hematocrit 36.5, platelets 213.  Serum electrolytes:  Sodium 141,  potassium 4.2, chloride 107, carbonate 29, BUN is 14, creatinine 0.7,  glucose is 93.  Pro time 13, INR 1.0.   Greater than 35 minutes      Maple Mirza,  PA      Duke Salvia, MD, [facility]  Electronically Signed    GM/MEDQ  D:  05/22/2007  T:  05/22/2007  Job:  [id number]   cc:   Quita Skye. Artis Flock, M.D.  Duke Salvia, MD, [facility]

## 2011-04-09 NOTE — Op Note (Signed)
Sara [name], Sara [name] NO.:  [id number]   MEDICAL RECORD NO.:  [id number]          PATIENT TYPE:  OIB   LOCATION:  3733                         FACILITY:  [facility]   PHYSICIAN:  Duke Salvia, MD, FACCDATE OF BIRTH:  Mar 19, 1951   DATE OF PROCEDURE:  05/21/2007  DATE OF DISCHARGE:                               OPERATIVE REPORT   PREOPERATIVE DIAGNOSIS:  Supraventricular tachycardia.   POSTOPERATIVE DIAGNOSIS:  Dual antegrade AV nodal physiology with  inducible echo beat; junctional tachycardia.   PROCEDURE:  Invasive electrophysiological study, isoproterenol infusion,  arrhythmia mapping x2, radiofrequency catheter ablation x2.   Following obtaining informed consent, the patient was brought to the  electrophysiology laboratory and placed on the fluoroscopic table in the  supine position. After routine prep and drape, cardiac catheterization  was performed with local anesthesia and conscious sedation.  Noninvasive  blood pressure monitoring, transcutaneous oxygen saturation monitoring  and end tidal CO2 monitoring were performed continuously throughout the  procedure.  Following the procedure, the catheters removed, hemostasis  was obtained. The patient was transferred to the holding area in stable  condition.   CATHETERS:  1. A 5-French quadripolar catheter was inserted via the left femoral      vein to the AV junction to measure His electrogram.  2. A 5-French bipolar catheter was inserted via femoral vein to the      right ventricular apex.  3. A 6-French octapolar catheter was inserted via right femoral vein      to the coronary sinus.  4. A 7-French 4 mm deflectable tip catheter was inserted via an SR0      sheath to mapping sites in the posterior septal space.   Surface leads 1, aVF and V1 were monitored continuously throughout the  procedure.  Following insertion of the catheters, the stimulation  protocol included incremental atrial pacing, incremental  ventricular  pacing.  Single and double atrial extra stimuli at paced cycle length of  600, 500, and 400 milliseconds.   RESULTS:  Surface electrocardiogram:   Initial:  Rhythm:  Sinus; PR interval 1075 milliseconds; PR interval:  178  milliseconds; QRS duration:  91 milliseconds; QT interval:  416  milliseconds; P-wave duration 121 milliseconds; bundle branch block:  Absent; pre-excitation absent.  AH interval:  107 milliseconds; HV interval:  34 milliseconds.   Final:  Rhythm sinus; RR interval 630 milliseconds; PR interval:  215  milliseconds; QRS duration 96 milliseconds; QT interval 364  milliseconds; P-wave duration 96 milliseconds; bundle branch block:  Absent; pre-excitation absent.  AH interval:  180 milliseconds; HV interval 39 milliseconds.  AV nodal function.  AV nodal Wenckebach was seen a 360 milliseconds.  VA  Wenckebach was seen at 380 milliseconds.   Pre-ablation to AV nodal conduction was discontinuous echo beats, post  procedure there was continuous.  Retrograde conduction was continuous.   Accessory pathway function:  No evidence of an accessory pathway was  identified.   Arrhythmias induced:  1. The patient had a narrow QRS tachycardia that initiated      spontaneously on a number of occasions.  VA times  in this      tachycardia were approximately 0.  The patient also was noted to      have AV nodal discontinuity with inducible echo beats.   It was noted during the accessory rhythm that was the most easily in the  setting of isoproterenol that VA conduction was 1:1.   The initial impression was that this represented a junctional  tachycardia.  However, the patient also had a VA conduction which is  unusual and had dual AV nodal physiology and echo beats.  Because of  that, slow pathway modification was undertaken with elimination of slow  pathway conduction.  However, the patient continued to have the  tachycardia.  We then mapped earliest atrial  activation and this turned  out to be just medial to the His bundle catheter.  On a site where no  His bundle potential was identified and the atrial activation was  earliest, RF energy was applied with gradual up titration of  temperature.  After 30 seconds, this ablation was terminated and what we  noted is that I had created VA conduction block from the junctional  tachycardia.   It was elected, at this point, to terminate further mapping and for  consideration of both follow-up as well as cryoablation in the event  that symptoms persisted.   Total fluoroscopy time was about 5 minutes 15 seconds.   The patient tolerated the procedure without apparent complication.      Duke Salvia, MD, [facility]  Electronically Signed     SCK/MEDQ  D:  05/21/2007  T:  05/22/2007  Job:  [id number]   cc:   Quita Skye. Artis Flock, M.D.

## 2011-04-12 NOTE — Op Note (Signed)
Sara [name], Sara [name]               ACCOUNT NO.:  [id number]   MEDICAL RECORD NO.:  [id number]          PATIENT TYPE:  INP   LOCATION:  1611                         FACILITY:  [facility]   PHYSICIAN:  Almedia Balls. Fore, M.D.   DATE OF BIRTH:  1951/09/06   DATE OF PROCEDURE:  06/10/2005  DATE OF DISCHARGE:                                 OPERATIVE REPORT   PREOPERATIVE DIAGNOSES:  1.  Uterine enlargement, probable fibroids.  2.  Uterine prolapse.  3.  Cystocele, rectocele, probable enterocele.   POSTOPERATIVE DIAGNOSES:  1.  Uterine enlargement, probable fibroids.  2.  Uterine prolapse.  3.  Cystocele, rectocele, definite enterocele.  4.  Pending pathology.   OPERATION:  1.  Laparoscopically-assisted vaginal hysterectomy.  2.  Anterior and posterior colporrhaphy.  3.  Repair of enterocele.   ANESTHESIA:  General orotracheal.   OPERATOR:  Dr. Randell Patient   FIRST ASSISTANT:  Dr. Laureen Ochs   INDICATIONS FOR SURGERY:  The patient is a 60 year old with the above-noted  problems, who was counseled as to the need for surgery to treat these  problems.  She has been fully counseled as to the type of procedure and the  risks involved to include risks of anesthesia; injury to bowel, bladder,  blood vessels, ureters, postoperative hemorrhage, infection, recuperation,  use of self-catheterization postoperatively.  She fully understands all of  these considerations and has signed informed consent to proceed on 10 June 2005.   OPERATIVE FINDINGS:  On laparoscopy, the lower liver edge, gallbladder,  spleen, the area of the appendix were normal to visualization.  The uterus  was mid posterior, approximately [redacted] weeks gestational size, with irregular  areas subserosally suggestive of myomata.  Both ovaries appeared to be  normal and somewhat atrophic.  In the vaginal area, there was noted to be a  moderate to severe cystocele and rectocele and probable enterocele present.   DESCRIPTION OF PROCEDURE:  With  the patient under general anesthesia,  prepared and draped in the usual sterile fashion, Foley catheter was placed  in the bladder, and the cervix was grasped with a Hulka tenaculum.  The  patient was then prepared and draped for a laparoscopic procedure.  An  incision was made in the lower pole of the umbilicus with insertion of the  Veress cannula and insufflation of 3 L of carbon dioxide.  The disposable 10  mm trocar for the operative scope and the scope itself were then inserted  into the peritoneal cavity.  Disposable 5 mm probe was inserted through a  stab wound just above the symphysis pubis.  The above-noted findings were  visualized without difficulty.  Bipolar electrocoagulation was used to  render the tubes, uteroovarian anastomoses, and round ligaments bilaterally  hemostatic.  Some filmy structures were sequentially cut free for retention  of the ovaries bilaterally.  Bladder flap was developed on the anterior  surface of the uterus using bipolar electrocoagulation then using sharp  dissection.  After noting that hemostasis was maintained in the peritoneal  cavity, the patient was reprepared and draped for a vaginal procedure.   A  weighted speculum was placed in the vagina, and the cervix was grasped  with two Loman [place].  A solution of 1% lidocaine with 1:100,000  epinephrine was injected circumferentially on the cervix.  An incision was  made in the vaginal mucosa from the 3 to 9 o'clock positions anteriorly with  dissection of the bladder off the cervix and lower uterine segment.  The  posterior cul-de-sac was then entered using sharp dissection, and Heaney  clamps were used to clamp in succussion the uterosacral ligaments and  vaginal cuff angles bilaterally, the cardinal ligaments bilaterally, and the  uterine vessels bilaterally; these structures were individually cut free  from free and individually suture ligated with 1 chromic catgut.  It was  then possible to  invert the uterus and remove the uterus by clamping the  remaining small portion of broad ligament that was left, cutting the  structure and ligating this with 1 chromic catgut.  The uterus and cervix  were removed intact.  The area was lavaged with copious amounts of lactated  Ringer's solution, and after noting that hemostasis was maintained  internally, a modified McCall suture was placed in the posterior cul-de-sac  for elevation of this area.  The peritoneum was then closed with a  pursestring suture of 1 chromic catgut.  Prior to closing the peritoneum, it  was noted that there was a small bleeder on the portion of omentum which had  been protruding through the vaginal incision.  This was then ligated with  interrupted free tie of 1 chromic catgut.  The vaginal cuff was then  rendered hemostatic and reapproximated with a continuous interlocking suture  of 2-0 chromic catgut.  The uterosacral ligament sutures which had been held  long were tied together in the midline for further support as was the  modified McCall suture in the posterior cul-de-sac.   Attention was then directed to the cystocele.  An incision was made in the  vaginal mucosa in the midline with dissection of the mucosa away from the  fascia underlying the urethrovesical angle, bladder, and urethra itself.  This was dissected free using sharp and blunt dissection.  Imbricating  sutures of 0 Vicryl were then placed to elevate the urethrovesical angle and  the urethra, thereby effectively elongating the urethra.  Imbricating  sutures were likewise used to elevate the cystocele.  Redundant vaginal  mucosa was then removed, and the vaginal mucosa was reapproximated and  rendered hemostatic with a continuous suture of 2-0 chromic catgut.   Attention was directed to the rectocele and probable enterocele at this point.  An incision was made in the midline of the vaginal mucosa after  injection of a total of 10 mL for  anterior and posterior colporrhaphy of 1%  lidocaine with 1:100,000 epinephrine.  This incision was continued up the  midline in the posterior vagina, and underlying levator __________ fascia,  and the enterocele was then dissected off this vaginal mucosa.  The  enterocele was reduced and closed using imbricating pursestring sutures of 0  Vicryl.  Rectocele was likewise reduced by imbricating horizontal mattress  sutures of 0 Vicryl.  Redundant vaginal mucosa was trimmed in this area, and  the vaginal mucosa was then reapproximated and rendered hemostatic with  continuous interlocking suture of 2-0 chromic catgut.  After noting that  hemostasis was maintained in the vaginal area, attention was directed to the  laparoscopic area.   The patient was repositioned, and after change of gloves, laparoscope was  reemployed  to inspect the intraperitoneal site.  This was lavaged with  copious amounts of lactated Ringer's solution and, after noting that  hemostasis was maintained in this area and that sponge and instrument counts  were correct, the gas was allowed to escape.  The instruments were removed  from the peritoneal cavity, and the incisions were closed with fascial  sutures of 0 Vicryl  and subcuticular sutures of 3-0 plain catgut.  Estimated blood loss 150 mL.  The patient was taken to the recovery room in good condition with clear  urine in the Foley catheter tubing.  She will be placed on 23-hour  observation following surgery.       SRF/MEDQ  D:  06/10/2005  T:  06/10/2005  Job:  [id number]   cc:   Leona Singleton, M.D.  [address]  [place]  [place] [address]  Fax: [phone]

## 2011-04-12 NOTE — H&P (Signed)
NAMEDOMINIGUE, GELLNER               ACCOUNT NO.:  [id number]   MEDICAL RECORD NO.:  [id number]          PATIENT TYPE:  INP   LOCATION:  NA                           FACILITY:  [facility]   PHYSICIAN:  Almedia Balls. Fore, M.D.   DATE OF BIRTH:  1951-06-12   DATE OF ADMISSION:  06/10/2005  DATE OF DISCHARGE:                                HISTORY & PHYSICAL   CHIEF COMPLAINT:  Prolapse, pressure.   HISTORY:  Patient is a 60 year old gravida 0 who has been followed in our  office for many years.  She has had gradual progression of uterovaginal  prolapse to the point where the uterus and cervix are present at the  introitus with concomitant cystocele and rectocele.  She has had overactive  bladder for some time and has been maintained on Detrol LA for this problem.  She has been also doing Kegel exercises and has been maintained on hormone  supplements without improvement.  Pap smear was normal in January of 2006.  She is admitted at this time for laparoscopically assisted vaginal  hysterectomy, possible TAH, possible BSO, anterior and posterior  colporrhaphy.  She has been fully counseled as to the nature of the  procedure and the risks involved to include risks of anesthesia, injury to  bowel, bladder, blood vessels, ureters, postoperative hemorrhage, infection,  recuperation, use of postoperative catheter for which the patient desires to  try self-catheterization and worsening perhaps of her overactive bladder  status.  She fully understands all these considerations and wishes to  proceed on June 10, 2005.   ALLERGIES:  No known drug allergies.   MEDICATIONS:  1.  She has taken Detrol LA 4 mg daily.  2.  Activella one daily.  3.  She is also on Verapamil and Effexor.   PAST MEDICAL HISTORY:  Surgery for right inguinal hernia done in March of  2003, with mesh by Dr. Jamey Ripa.   FAMILY HISTORY:  Maternal grandmother with breast cancer.  Father with  cardiovascular disease.   REVIEW OF  SYSTEMS:  HEENT:  Negative except for glasses.  CARDIORESPIRATORY:  Negative.  GASTROINTESTINAL:  Negative.  GENITOURINARY:  As in present  illness.  NEUROMUSCULAR:  Negative.   PHYSICAL EXAMINATION:  GENERAL APPEARANCE:  A well-developed white female in  no acute distress.  VITAL SIGNS:  Height 5 feet, 5 inches, weight 155, blood pressure 128/80,  pulse 84, respirations 18.  HEENT:  Within normal limits.  NECK:  Supple without masses, adenopathy or bruits.  LUNGS:  Clear to P&A.  CARDIOVASCULAR:  Regular rate and rhythm without murmurs.  BREASTS:  Examined sitting and lying without mass.  Axilla negative.  ABDOMEN:  Flat and soft without mass.  Nontender.  PELVIC:  External genitalia, Bartholin's, urethra and Skene's glands within  normal limits.  Vagina reveals prolapse of uterus to lower third to  approximately level of the introitus. There was a moderate to severe  cystocele and rectocele as well.  There were no palpable adnexal masses.  Rectovaginal exam is confirmatory.  EXTREMITIES:  Within normal limits.  NEUROLOGIC:  Central nervous system grossly  intact.  SKIN:  Without suspicious lesions.   IMPRESSION:  Uterovaginal prolapse with cystocele, rectocele, possible  enterocele.   DISPOSITION:  As noted above.       SRF/MEDQ  D:  06/04/2005  T:  06/04/2005  Job:  [id number]

## 2011-04-12 NOTE — Discharge Summary (Signed)
Sara [name], Sara [name]               ACCOUNT NO.:  [id number]   MEDICAL RECORD NO.:  [id number]          PATIENT TYPE:  INP   LOCATION:  1611                         FACILITY[facility]  [facility]   PHYSICIAN:  Almedia Balls. Fore, M.D.   DATE OF BIRTH:  30-Aug-1951   DATE OF ADMISSION:  06/10/2005  DATE OF DISCHARGE:  06/11/2005                                 DISCHARGE SUMMARY   HISTORY:  The patient is a 60 year old with uterovaginal prolapse, uterine  enlargement, pelvic pain, cystocele, rectocele, probable enterocele for  definitive surgery on June 10, 2005. The remainder of her history and  physical are as previously dictated.   LABORATORY DATA:  Preoperative hemoglobin 13.3, BMET panel which was within  normal limits. Chest x-ray within normal limits, electrocardiogram normal  and test for ishcemia were within normal limits.   HOSPITAL COURSE:  The patient was taken to the operating room on June 10, 2005 at which time laparoscopically assisted vaginal hysterectomy, anterior  and posterior colporrhaphy, repair of enterocele were performed. The patient  did well postoperatively. Diet and ambulation progressed over the evening of  July 17 and early morning of July 18. A Foley catheter was removed on the  morning of July 18 and the patient was instructed in self catheterization  which he is to do at home. She was afebrile and experiencing no problems  otherwise, and it was felt that she could be discharged at this time.   FINAL DIAGNOSES:  Uterovaginal prolapse, cystocele, rectocele, enterocele.   OPERATION:  Laparoscopically assisted vaginal hysterectomy, intrauterine and  posterior colporrhaphy, repair of enterocele. Pathology report unavailable  at the time of dictation.   DISPOSITION:  Discharged home to return to the office in two weeks for  followup and to stop catheterization when she has residual urine less than  60 mL twice in a day. She is to limit lifting and driving for two weeks and  to call if unusual bleeding, pain or unexplained fever should ensue. She was  fully ambulatory, on a regular diet, and in good condition at the time of  discharge.   She was given prescriptions for Percocet #30 to be taken 1 or 2 q.6 h p.r.n.  pain, doxicycline 100 mg #12 to be taken 1 b.i.d.       SRF/MEDQ  D:  06/11/2005  T:  06/12/2005  Job:  [id number]   cc:   Almedia Balls. Fore, M.D.  [phone] [place]  [place] [address]  Fax: [phone]

## 2011-04-12 NOTE — Op Note (Signed)
. [facility]  Patient:    Sara [name], Sara [name] Visit Number: [id number] MRN: [id number]          Service Type: DSU Location: [facility] Attending Physician:  Charlton Haws Dictated by:   Currie Paris, M.D. Proc. Date: 02/12/02 Admit Date:  02/12/2002   CC:         Almedia Balls. Randell Patient, M.D.  Quita Skye Artis Flock, M.D.   Operative Report  VISIT:  [id number]  [id number]  PREOPERATIVE DIAGNOSIS:  Indirect right inguinal hernia.  POSTOPERATIVE DIAGNOSIS:  Indirect right inguinal hernia.  OPERATION:  Repair of indirect right inguinal hernia with mesh.  SURGEON:  Currie Paris, M.D.  ANESTHESIA:  MAC.  CLINICAL HISTORY:  This patient is a 60 year old lady with a symptomatic right inguinal hernia.  DESCRIPTION OF PROCEDURE:  The patient was seen in the holding area, and the operative side marked as the right side.  She was then taken to the operating room and given IV sedation.  The area was prepped and draped as a sterile field.  A combination of 1% Xylocaine with epinephrine plus 0.5% plain Marcaine was mixed equally and used for local.  I infiltrated along the incision line and above the anterosuperior iliac spine.  The incision was made deep in the external oblique aponeurosis and with bleeders coagulated or tied with 4-0 Vicryl.  External oblique was opened in line with its fibers.  An indirect hernia was immediately identified and the round ligament structures dissected up off the inguinal floor surrounded with a Penrose drain.  The ilioinguinal nerve was found and the hernia contents dissected off of that as well as the round ligament.  The round ligament was amputated as it came out through the deep ring and tied with 4-0 Vicryl.  I then cleaned it off distally so that we had only the nerve out left and amputated it distally and again ligated with 4-0 Vicryl.  The hernia itself was reduced and the deep ring then closed with a  2-0 Prolene.  To reinforce the entire area, I put a piece of 3 x 6 inch mesh which was then trimmed to appropriate size and tapered at the medial end.  It was sutured with a running suture inferiorly and then tacked to the internal oblique medially and superiorly, and it was split laterally so that the nerve could come out through it without being occluded or involved.  The area appeared to be dry.  I infiltrated with additional local as we had gone along.  The external oblique was closed with 3-0 Vicryl, Scarpas with 3-0 Vicryl, and the skin with 4-0 Monocryl subcuticular plus Steri-Strips.  The patient tolerated the procedure well.  There were no operative complications, and all counts were correct. Dictated by:   Currie Paris, M.D. Attending Physician:  Charlton Haws DD:  02/12/02 TD:  02/14/02 Job: [id number]

## 2011-04-12 NOTE — Assessment & Plan Note (Signed)
[facility]                           ELECTROPHYSIOLOGY OFFICE NOTE   RAYLINN, KOSAR                      MRN:          [id number]  DATE:10/21/2006                            DOB:          01/03/1951    Mrs. Losier is seen.  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  I have been following her for 12 years and she has finally  decided that she has had enough of this.  She is having episodes that she  cannot terminate that last 10-12 hours and are associated with significant  fatigue.  She takes Verapamil on a p.r.n. basis.  She would like to come off  the Verapamil and have an ablation procedure done.   On examination, her blood pressure is 114/64, pulse was 66, lungs were  clear, heart sounds were regular, extremities were without edema.   Electrocardiogram dated today demonstrated sinus rhythm at 66 with  0.15/.07/.04.  The axis was 30 degrees.   Electrocardiograms that should be included in the procedural packet date  back to January 04, 1994 where is there is a narrow QRS tachycardia  associated with a pseudo S-wave in lead 2, a suggestion of an R prime in  lead V1 but one of which I am unconvinced.   The electrocardiogram dated January 31, 1994 demonstrates a P-wave clearly  evident in the proximal portion of the ST segment that appears to be upright  in lead L, as well as inverted in F.   IMPRESSION:  1. 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  2. Occasional atypical chest pain.   Mrs. Lahman has finally after all these years decided that she has had enough  of this tachycardia.  She would like to proceed with RF catheter ablation.  We reviewed the potential benefits as well the potential risks including but  not limited to death, perforation, heart block requiring pacemaker  implantation.   She has normal left ventricular function with a normal Myoview in April of  2006.     Duke Salvia,  MD, [facility]  Electronically Signed    SCK/MedQ  DD: 10/21/2006  DT: 10/21/2006  Job #: [id number]   cc:   Quita Skye. Artis Flock, M.D.

## 2011-05-23 ENCOUNTER — Encounter: Payer: Self-pay | Admitting: Internal Medicine

## 2011-06-27 ENCOUNTER — Other Ambulatory Visit: Payer: Self-pay | Admitting: Obstetrics & Gynecology

## 2011-06-27 DIAGNOSIS — N951 Menopausal and female climacteric states: Secondary | ICD-10-CM

## 2011-07-04 ENCOUNTER — Ambulatory Visit
Admission: RE | Admit: 2011-07-04 | Discharge: 2011-07-04 | Disposition: A | Discharge status: Home / Self Care | Payer: BC Managed Care – PPO | Source: Ambulatory Visit | Attending: Obstetrics & Gynecology | Admitting: Obstetrics & Gynecology

## 2011-07-04 DIAGNOSIS — N951 Menopausal and female climacteric states: Secondary | ICD-10-CM

## 2011-08-20 ENCOUNTER — Encounter: Payer: Self-pay | Admitting: Internal Medicine

## 2011-09-11 LAB — BASIC METABOLIC PANEL
BUN: 14
CO2: 29
Chloride: 107
Creatinine, Ser: 0.7
Glucose, Bld: 93
Potassium: 4.2

## 2011-09-11 LAB — CBC
HCT: 36.5
MCHC: 34.3
MCV: 93.3
Platelets: 213
RDW: 13

## 2011-09-11 LAB — PROTIME-INR: Prothrombin Time: 13

## 2012-01-25 ENCOUNTER — Ambulatory Visit (INDEPENDENT_AMBULATORY_CARE_PROVIDER_SITE_OTHER): Payer: BC Managed Care – PPO | Admitting: Family Medicine

## 2012-01-25 ENCOUNTER — Encounter: Payer: Self-pay | Admitting: Family Medicine

## 2012-01-25 VITALS — BP 120/84 | HR 78 | Temp 99.4°F | Wt 158.1 lb

## 2012-01-25 DIAGNOSIS — J069 Acute upper respiratory infection, unspecified: Secondary | ICD-10-CM

## 2012-01-25 MED ORDER — MOMETASONE FUROATE 50 MCG/ACT NA SUSP: 2.0000 | Freq: Every day | NASAL | Status: DC

## 2012-01-25 MED ORDER — PROMETHAZINE HCL 25 MG PO TABS: 25.0000 mg | ORAL_TABLET | Freq: Three times a day (TID) | ORAL | Status: DC | PRN

## 2012-01-25 NOTE — Assessment & Plan Note (Signed)
Supportive care. Discussed reasons to call us back to be concerned about bacterial infection. Hold off on tamiflu (outside of window anyways and healthy). Red flags to return discussed.

## 2012-01-25 NOTE — Progress Notes (Signed)
  Subjective:    Patient ID: Sara [name], female    DOB: 1951-09-15, 61 y.o.   MRN: [id number]  HPI CC: sinusitis?  4d h/o coughing.  Next morning temp to 101.  Fatigued since then.  Slept all day Thursday and Friday.  Decreased appetite, PNdrainage.  + nausea and diarrhea as well.  No vomiting.  Cough actually decreased since sxs started.  + HA but not sinus pressure pain.  ++ sinus congestion  Taking tylenol and mucinex D.  No vomiting, no body aches, muscle pains, joint pains, new rashes.  No ear pain or tooth pain. No chest pain, sob.  Sinus infection presented like this in past - several years ago leading to PNA (02/2011)  Had flu shot and pneumonia shot this past year.  No sick contacts at home.  No smokers at home.  Wednesday night spent several hours at hospital.  No n/o asthma, COPD.  Review of Systems Per HPI    Objective:   Physical Exam  Nursing note and vitals reviewed. Constitutional: She appears well-developed and well-nourished. No distress.  HENT:  Head: Normocephalic and atraumatic.  Right Ear: Hearing, tympanic membrane, external ear and ear canal normal.  Left Ear: Hearing, tympanic membrane, external ear and ear canal normal.  Nose: No mucosal edema or rhinorrhea. Right sinus exhibits maxillary sinus tenderness. Right sinus exhibits no frontal sinus tenderness. Left sinus exhibits maxillary sinus tenderness. Left sinus exhibits no frontal sinus tenderness.  Mouth/Throat: Uvula is midline and mucous membranes are normal. Posterior oropharyngeal erythema present. No oropharyngeal exudate, posterior oropharyngeal edema or tonsillar abscesses.       Pale turbinates and nasal mucosa  Eyes: Conjunctivae and EOM are normal. Pupils are equal, round, and reactive to light. No scleral icterus.  Neck: Normal range of motion. Neck supple.  Cardiovascular: Normal rate, regular rhythm, normal heart sounds and intact distal pulses.   No murmur heard. Pulmonary/Chest: Effort  normal and breath sounds normal. No respiratory distress. She has no wheezes. She has no rales.  Lymphadenopathy:    She has no cervical adenopathy.  Skin: Skin is warm and dry. No rash noted.  Psychiatric: She has a normal mood and affect.       Assessment & Plan:

## 2012-01-25 NOTE — Patient Instructions (Signed)
This is either early sinusitis (viral), or flu like illness. Treat with plenty of fluid and rest, continue mucinex, continue tylenol as needed. Start nasonex.  Nasal saline irrigation. Phenergan for nausea. Update [place] if fever >101 returning, worsening productive cough, or if symptoms going on past 10 days. I hope you start feeling better!

## 2012-01-29 ENCOUNTER — Encounter: Payer: Self-pay | Admitting: Internal Medicine

## 2012-02-04 ENCOUNTER — Telehealth: Payer: Self-pay

## 2012-02-04 MED ORDER — AZITHROMYCIN 250 MG PO TABS: ORAL_TABLET | ORAL | Status: AC

## 2012-02-04 NOTE — Telephone Encounter (Signed)
For zpack asd - done per emr

## 2012-02-04 NOTE — Telephone Encounter (Signed)
Pt called stating that she is still ill. She was seen at Saturday clinic by Dr Sharen Hones and advised to call back for ABX if there was no improvement or worsening or sxs. Pt still has cough. Congestion, HA, please advise.

## 2012-02-04 NOTE — Telephone Encounter (Signed)
Called patient informed prescription sent to pharmacy

## 2012-12-04 ENCOUNTER — Ambulatory Visit (INDEPENDENT_AMBULATORY_CARE_PROVIDER_SITE_OTHER): Payer: 59 | Admitting: Family Medicine

## 2012-12-04 ENCOUNTER — Encounter: Payer: Self-pay | Admitting: Family Medicine

## 2012-12-04 VITALS — BP 128/70 | HR 71 | Temp 97.7°F | Ht 65.5 in | Wt 162.4 lb

## 2012-12-04 DIAGNOSIS — K219 Gastro-esophageal reflux disease without esophagitis: Secondary | ICD-10-CM | POA: Insufficient documentation

## 2012-12-04 DIAGNOSIS — G4762 Sleep related leg cramps: Secondary | ICD-10-CM | POA: Insufficient documentation

## 2012-12-04 LAB — BASIC METABOLIC PANEL
BUN: 15 mg/dL (ref 6–23)
CO2: 26 mEq/L (ref 19–32)
Calcium: 9.3 mg/dL (ref 8.4–10.5)
GFR: 103.33 mL/min (ref >60.00)
Glucose, Bld: 91 mg/dL (ref 70–99)
Sodium: 139 mEq/L (ref 135–145)

## 2012-12-04 LAB — CBC WITH DIFFERENTIAL/PLATELET
Basophils Absolute: 0 10*3/uL (ref 0.0–0.1)
Eosinophils Absolute: 0.3 10*3/uL (ref 0.0–0.7)
Hemoglobin: 12.8 g/dL (ref 12.0–15.0)
Lymphocytes Relative: 34.4 % (ref 12.0–46.0)
Lymphs Abs: 1.9 10*3/uL (ref 0.7–4.0)
MCHC: 33.8 g/dL (ref 30.0–36.0)
Monocytes Relative: 8.9 % (ref 3.0–12.0)
Neutro Abs: 2.8 10*3/uL (ref 1.4–7.7)
Platelets: 218 10*3/uL (ref 150.0–400.0)
RDW: 13.1 % (ref 11.5–14.6)

## 2012-12-04 MED ORDER — RANITIDINE HCL 150 MG PO TABS: 150.0000 mg | ORAL_TABLET | Freq: Every day | ORAL | Status: DC

## 2012-12-04 NOTE — Assessment & Plan Note (Signed)
New.  Pt's chest tightness w/ eating and associated shoulder pain are consistent w/ esophageal spasm- likely from silent GERD.  Start low dose H2 blocker.  Continue tums prn.  Reviewed supportive care and red flags that should prompt return.  Pt expressed understanding and is in agreement w/ plan.

## 2012-12-04 NOTE — Patient Instructions (Addendum)
Schedule your complete physical at your convenience We'll notify you of your lab results and make any changes if needed Increase your water intake Start the Zantac (Ranitidine) daily for heart burn Call with any questions or concerns Welcome!  We're glad to have you! Happy [birth year] Birthday!

## 2012-12-04 NOTE — Assessment & Plan Note (Signed)
New.  Suspect this is due to pt's poor water intake and consumption of diuretics (caffeine, ETOH).  Check labs to r/o other causes.  Reviewed supportive care and red flags that should prompt return.  Pt expressed understanding and is in agreement w/ plan.

## 2012-12-04 NOTE — Progress Notes (Signed)
  Subjective:    Patient ID: Sara [name], female    DOB: 1951-01-17, 62 y.o.   MRN: [id number]  HPI New to establish.  Previous MD- Felicity Coyer.  GYN- [facility], overdue on mammo [facility]), UTD on DEXA, UTD on colonoscopy.  Due for CPE.  Chest tightness- occurs w/ eating, particularly at night.  Will get severe L shoulder pain associated.  Improves w/ tums- resolves w/in 10 minutes.  No burning in chest or sour brash.  No sxs when lying down at night.  Denies increased gas or burping.  No particular type of food triggers sxs.  Nocturnal leg/foot cramps- not occuring nightly but will wake pt and require her to get up and walk.  Will occasionally have drawing up of toes.  Admits to limited water intake.  Will have 2 cups of coffee, some tea, 1 glass of wine w/ dinner.     Review of Systems For ROS see HPI     Objective:   Physical Exam  Vitals reviewed. Constitutional: She is oriented to person, place, and time. She appears well-developed and well-nourished. No distress.  HENT:  Head: Normocephalic and atraumatic.  Eyes: Conjunctivae normal and EOM are normal. Pupils are equal, round, and reactive to light.  Neck: Normal range of motion. Neck supple. No thyromegaly present.  Cardiovascular: Normal rate, regular rhythm, normal heart sounds and intact distal pulses.   No murmur heard. Pulmonary/Chest: Effort normal and breath sounds normal. No respiratory distress.  Abdominal: Soft. She exhibits no distension. There is no tenderness.  Musculoskeletal: She exhibits no edema and no tenderness.  Lymphadenopathy:    She has no cervical adenopathy.  Neurological: She is alert and oriented to person, place, and time. She has normal reflexes. No cranial nerve deficit. Coordination normal.  Skin: Skin is warm and dry.  Psychiatric: She has a normal mood and affect. Her behavior is normal.          Assessment & Plan:

## 2012-12-07 ENCOUNTER — Encounter: Payer: Self-pay | Admitting: *Deleted

## 2012-12-11 ENCOUNTER — Other Ambulatory Visit: Payer: Self-pay | Admitting: Family Medicine

## 2012-12-11 DIAGNOSIS — Z1231 Encounter for screening mammogram for malignant neoplasm of breast: Secondary | ICD-10-CM

## 2013-01-05 ENCOUNTER — Ambulatory Visit
Admission: RE | Admit: 2013-01-05 | Discharge: 2013-01-05 | Disposition: A | Discharge status: Home / Self Care | Payer: 59 | Source: Ambulatory Visit | Attending: Family Medicine | Admitting: Family Medicine

## 2013-01-05 DIAGNOSIS — Z1231 Encounter for screening mammogram for malignant neoplasm of breast: Secondary | ICD-10-CM

## 2013-01-13 ENCOUNTER — Encounter: Payer: Self-pay | Admitting: Family Medicine

## 2013-01-13 ENCOUNTER — Ambulatory Visit (INDEPENDENT_AMBULATORY_CARE_PROVIDER_SITE_OTHER): Payer: 59 | Admitting: Family Medicine

## 2013-01-13 VITALS — BP 130/78 | HR 64 | Temp 98.1°F | Ht 65.5 in | Wt 159.8 lb

## 2013-01-13 DIAGNOSIS — Z0000 Encounter for general adult medical examination without abnormal findings: Secondary | ICD-10-CM

## 2013-01-13 DIAGNOSIS — N3941 Urge incontinence: Secondary | ICD-10-CM

## 2013-01-13 DIAGNOSIS — K219 Gastro-esophageal reflux disease without esophagitis: Secondary | ICD-10-CM

## 2013-01-13 LAB — HEPATIC FUNCTION PANEL
AST: 25 U/L (ref 0–37)
Albumin: 4 g/dL (ref 3.5–5.2)
Alkaline Phosphatase: 73 U/L (ref 39–117)
Total Bilirubin: 0.4 mg/dL (ref 0.3–1.2)

## 2013-01-13 LAB — TSH: TSH: 0.87 u[IU]/mL (ref 0.35–5.50)

## 2013-01-13 LAB — LIPID PANEL
HDL: 64.7 mg/dL (ref >39.00)
Triglycerides: 106 mg/dL (ref 0.0–149.0)
VLDL: 21.2 mg/dL (ref 0.0–40.0)

## 2013-01-13 MED ORDER — OMEPRAZOLE 20 MG PO CPDR: 20.0000 mg | DELAYED_RELEASE_CAPSULE | Freq: Every day | ORAL | Status: DC

## 2013-01-13 NOTE — Progress Notes (Signed)
  Subjective:    Patient ID: Sara [name], female    DOB: 1951/06/14, 62 y.o.   MRN: [id number]  HPI CPE- has appt upcoming w/ Dr Juliene Pina, mammo last week, UTD on colonoscopy.   Review of Systems Patient reports no vision/ hearing changes, adenopathy,fever, weight change,  persistant/recurrent hoarseness , swallowing issues, chest pain, palpitations, edema, persistant/recurrent cough, hemoptysis, dyspnea (rest/exertional/paroxysmal nocturnal), gastrointestinal bleeding (melena, rectal bleeding), abdominal pain, bowel changes, Gyn symptoms (abnormal  bleeding, pain),  syncope, focal weakness, memory loss, numbness & tingling, skin/hair/nail changes, abnormal bruising or bleeding, anxiety, or depression.   + GERD- no relief w/ Ranitidine Urinary incontinence- chronic problem, s/p bladder tack, previously on Detrol w/ memory side effects.  No improvement on Toviaz.    Objective:   Physical Exam General Appearance:    Alert, cooperative, no distress, appears stated age  Head:    Normocephalic, without obvious abnormality, atraumatic  Eyes:    PERRL, conjunctiva/corneas clear, EOM's intact, fundi    benign, both eyes  Ears:    Normal TM's and external ear canals, both ears  Nose:   Nares normal, septum midline, mucosa normal, no drainage    or sinus tenderness  Throat:   Lips, mucosa, and tongue normal; teeth and gums normal  Neck:   Supple, symmetrical, trachea midline, no adenopathy;    Thyroid: no enlargement/tenderness/nodules  Back:     Symmetric, no curvature, ROM normal, no CVA tenderness  Lungs:     Clear to auscultation bilaterally, respirations unlabored  Chest Wall:    No tenderness or deformity   Heart:    Regular rate and rhythm, S1 and S2 normal, no murmur, rub   or gallop  Breast Exam:    Deferred to GYN  Abdomen:     Soft, non-tender, bowel sounds active all four quadrants,    no masses, no organomegaly  Genitalia:    Deferred to GYN  Rectal:    Extremities:   Extremities  normal, atraumatic, no cyanosis or edema  Pulses:   2+ and symmetric all extremities  Skin:   Skin color, texture, turgor normal, no rashes or lesions  Lymph nodes:   Cervical, supraclavicular, and axillary nodes normal  Neurologic:   CNII-XII intact, normal strength, sensation and reflexes    throughout          Assessment & Plan:

## 2013-01-13 NOTE — Patient Instructions (Addendum)
We'll notify you of your lab results and make any changes if needed Someone will call you with your Urology appt Start the Omeprazole daily Call and schedule your GYN appt Dr Betsy Coder [facility]) comes highly recommended Call with any questions or concerns [place]!!

## 2013-01-15 ENCOUNTER — Encounter: Payer: Self-pay | Admitting: *Deleted

## 2013-01-19 ENCOUNTER — Encounter: Payer: Self-pay | Admitting: *Deleted

## 2013-01-24 NOTE — Assessment & Plan Note (Signed)
Pt's PE WNL.  UTD on health maintenance.  Check labs.  Anticipatory guidance provided.  

## 2013-01-24 NOTE — Assessment & Plan Note (Signed)
Unchanged.  Stop H2 blocker, start PPI.  Will follow.

## 2013-01-24 NOTE — Assessment & Plan Note (Signed)
No relief on Toviaz, previously did not tolerate Detrol.  Refer to urology.

## 2013-03-21 LAB — HM PAP SMEAR: HM PAP: NORMAL

## 2013-04-05 ENCOUNTER — Telehealth: Payer: Self-pay | Admitting: Family Medicine

## 2013-04-05 DIAGNOSIS — K219 Gastro-esophageal reflux disease without esophagitis: Secondary | ICD-10-CM

## 2013-04-05 NOTE — Telephone Encounter (Signed)
Patient called stating she would like to proceed with GI referral. The medication we gave her is not working.

## 2013-04-06 NOTE — Telephone Encounter (Signed)
Ok to referral for GI (dx GERD)

## 2013-04-06 NOTE — Telephone Encounter (Signed)
Last OV:01/13/13-Nothing in the note regarding referral to GI. Please advise.//AB/CMA

## 2013-04-07 NOTE — Telephone Encounter (Signed)
Spoke with the pt and informed her that Dr. Beverely Low approved the referral to GI, and she will hear from [facility] regarding the appt.  Pt agreed.  Referral ordered and sent.//AB/CMA

## 2013-04-08 ENCOUNTER — Encounter: Payer: Self-pay | Admitting: Internal Medicine

## 2013-04-09 ENCOUNTER — Telehealth: Payer: Self-pay | Admitting: Internal Medicine

## 2013-04-09 NOTE — Telephone Encounter (Signed)
Moved patient to 05/04/13 at 9:00 AM. Left a message for Renee to call me.

## 2013-04-09 NOTE — Telephone Encounter (Signed)
Renee will call patient with appointment.

## 2013-04-13 ENCOUNTER — Ambulatory Visit (INDEPENDENT_AMBULATORY_CARE_PROVIDER_SITE_OTHER): Payer: PRIVATE HEALTH INSURANCE | Admitting: Podiatry

## 2013-04-13 ENCOUNTER — Encounter: Payer: Self-pay | Admitting: Podiatry

## 2013-04-13 VITALS — BP 137/74 | HR 63 | Ht 65.0 in | Wt 157.0 lb

## 2013-04-13 DIAGNOSIS — M21969 Unspecified acquired deformity of unspecified lower leg: Secondary | ICD-10-CM | POA: Insufficient documentation

## 2013-04-13 DIAGNOSIS — M722 Plantar fascial fibromatosis: Secondary | ICD-10-CM

## 2013-04-13 NOTE — Progress Notes (Signed)
Subjective:  C/O Pain on right heel x 4 weeks.  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 She has had plantar fasciitis before on right foot and been wearing inserts x 5-6 years. Patient brought in her several pairs of Orthotics, custom and otc.   Objective:  All pedal pulses are palpable. No skin lesions, normal color and temperature. Hypermobile first right at the first MCJ, high arched foot bil, rearfoot varus bil.  Contracted digit with digital corns 4th and 5th bilateral. 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 Elevated first ray bilateral. Dorsal spur at the first metatarsal head right. Contracted lesser digits 4th and 5th bilateral. Long first metatarsal on right.   Assessment:  Plantar fasciitis right heel. 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  Plan:  Reviewed all options, shoe, orthotics, injections. Patient wants to try new orthotics. Old ones are too hard to wear. Casted for Orthotics. Need soft with heel cushion, deep cup.

## 2013-05-04 ENCOUNTER — Encounter: Payer: Self-pay | Admitting: Internal Medicine

## 2013-05-04 ENCOUNTER — Ambulatory Visit (INDEPENDENT_AMBULATORY_CARE_PROVIDER_SITE_OTHER): Payer: PRIVATE HEALTH INSURANCE | Admitting: Internal Medicine

## 2013-05-04 VITALS — BP 120/78 | HR 68 | Ht 65.0 in | Wt 162.4 lb

## 2013-05-04 DIAGNOSIS — K219 Gastro-esophageal reflux disease without esophagitis: Secondary | ICD-10-CM

## 2013-05-04 DIAGNOSIS — R1013 Epigastric pain: Secondary | ICD-10-CM

## 2013-05-04 DIAGNOSIS — K3189 Other diseases of stomach and duodenum: Secondary | ICD-10-CM

## 2013-05-04 MED ORDER — OMEPRAZOLE 20 MG PO CPDR: 20.0000 mg | DELAYED_RELEASE_CAPSULE | Freq: Two times a day (BID) | ORAL | Status: DC

## 2013-05-04 NOTE — Progress Notes (Signed)
Sara [name] 1951/02/12 MRN [id number]        History of Present Illness:  This is a 62 year old white female with a one-year history of progressive pain in her left shoulder shortly after she begins to eat.. It does not occur with every meal but has been increasing in frequency and severity. The pain lasts about an hour and occurs with any type of food, mostly solids. She denies any dysphagia but has had some fullness and pressure in her upper abdomen after she eats. The pain does not radiate to her right shoulder at all. Her weight has been stable for past 10 years. She has a glass of wine every night. She had a normal screening colonoscopy in November 2009. She is currently on Prilosec 20 mg at noontime and it has helped somewhat  The pain does not increase with inspiration and there has been no cough. Patient does not smoke   Past Medical History  Diagnosis Date  . SUPRAVENTRICULAR TACHYCARDIA   . PLANTAR FASCIITIS, BILATERAL   . Edema   . Urge incontinence   . ANXIETY   . MYOFASCIAL PAIN SYNDROME    Past Surgical History  Procedure Laterality Date  . Cather ablation  04/2007  . Adenosine myoview  03/14/05  . Eye surgery    . Vaginal hysterectomy      reports that she has never smoked. She has never used smokeless tobacco. She reports that she drinks about 4.2 ounces of alcohol per week. She reports that she does not use illicit drugs. family history includes Arthritis in her mother; Breast cancer in her maternal grandmother; Coronary artery disease in her father and paternal grandfather; Hypertension in her mother; and Stroke in her paternal grandmother.  There is no history of Cancer. No Known Allergies      Review of Systems: Negative for dysphagia heartburn nausea vomiting  The remainder of the 10 point ROS is negative except as outlined in H&P   Physical Exam: General appearance  Well developed, in no distress. Eyes- non icteric. HEENT nontraumatic,  normocephalic. Mouth no lesions, tongue papillated, no cheilosis. Neck supple without adenopathy, thyroid not enlarged, no carotid bruits, no JVD. Lungs Clear to auscultation bilaterally. Cor normal S1, normal S2, regular rhythm, no murmur,  quiet precordium. Abdomen: Soft nontender. Nondistended. Normal active bowel sounds. No scars. Liver edge at costal margin. Left upper quadrant is unremarkable. Rectal: Not done Extremities no pedal edema., Left shoulder not tender areas she is a full range of motion. I cannot elicit the pain by pressure on the left shoulder Skin no lesions. Neurological alert and oriented x 3. Psychological normal mood and affect.  Assessment and Plan:  62 year old white female with unusual symptom of left shoulder pain occurring in association with meals and lasting for about an hour. It suggests either pressure on the left diaphragm or possibly irritation of that phrenic nerve.Possibilities include hiatal hernia stomach displacement by extrinsic mass,. Gastroparesis oror esophageal spasm. She will increase her Prilosec to 20 mg twice a day and we will go ahead and schedule her for upper endoscopy and also for upper abdominal ultrasound   05/04/2013 Lina Sar

## 2013-05-04 NOTE — Patient Instructions (Addendum)
Increase your Prilosec to one tablet by mouth twice daily. Samples have been provided.   You have been scheduled for an abdominal ultrasound at [facility] (1st floor of hospital) on 05/06/13 at 9:00am. Please arrive 15 minutes prior to your appointment for registration. Make certain not to have anything to eat or drink 6 hours prior to your appointment. Should you need to reschedule your appointment, please contact radiology at [phone]. This test typically takes about 30 minutes to perform.  You have been scheduled for an endoscopy with propofol. Please follow written instructions given to you at your visit today. If you use inhalers (even only as needed), please bring them with you on the day of your procedure. Your physician has requested that you go to www.startemmi.com and enter the access code given to you at your visit today. This web site gives a general overview about your procedure. However, you should still follow specific instructions given to you by our office regarding your preparation for the procedure.  Dr Beverely Low

## 2013-05-06 ENCOUNTER — Ambulatory Visit (HOSPITAL_COMMUNITY)
Admission: RE | Admit: 2013-05-06 | Discharge: 2013-05-06 | Disposition: A | Discharge status: Home / Self Care | Payer: PRIVATE HEALTH INSURANCE | Source: Ambulatory Visit | Attending: Internal Medicine | Admitting: Internal Medicine

## 2013-05-06 DIAGNOSIS — K219 Gastro-esophageal reflux disease without esophagitis: Secondary | ICD-10-CM

## 2013-05-06 DIAGNOSIS — R935 Abnormal findings on diagnostic imaging of other abdominal regions, including retroperitoneum: Secondary | ICD-10-CM | POA: Insufficient documentation

## 2013-05-06 DIAGNOSIS — R109 Unspecified abdominal pain: Secondary | ICD-10-CM | POA: Insufficient documentation

## 2013-05-12 ENCOUNTER — Ambulatory Visit (AMBULATORY_SURGERY_CENTER): Payer: PRIVATE HEALTH INSURANCE | Admitting: Internal Medicine

## 2013-05-12 ENCOUNTER — Encounter: Payer: Self-pay | Admitting: Internal Medicine

## 2013-05-12 VITALS — BP 137/70 | HR 53 | Temp 98.4°F | Resp 16 | Ht 65.0 in | Wt 162.0 lb

## 2013-05-12 DIAGNOSIS — K219 Gastro-esophageal reflux disease without esophagitis: Secondary | ICD-10-CM

## 2013-05-12 DIAGNOSIS — K3189 Other diseases of stomach and duodenum: Secondary | ICD-10-CM

## 2013-05-12 DIAGNOSIS — K222 Esophageal obstruction: Secondary | ICD-10-CM

## 2013-05-12 DIAGNOSIS — R1013 Epigastric pain: Secondary | ICD-10-CM

## 2013-05-12 MED ORDER — SODIUM CHLORIDE 0.9 % IV SOLN: 500.0000 mL | INTRAVENOUS | Status: DC

## 2013-05-12 MED ORDER — OMEPRAZOLE 20 MG PO CPDR: 20.0000 mg | DELAYED_RELEASE_CAPSULE | Freq: Two times a day (BID) | ORAL | Status: DC

## 2013-05-12 NOTE — Progress Notes (Signed)
Patient did not have preoperative order for IV antibiotic SSI prophylaxis. (G8918)  Patient did not experience any of the following events: a burn prior to discharge; a fall within the facility; wrong site/side/patient/procedure/implant event; or a hospital transfer or hospital admission upon discharge from the facility. (G8907)  

## 2013-05-12 NOTE — Progress Notes (Signed)
Stable to RR 

## 2013-05-12 NOTE — Patient Instructions (Signed)
YOU HAD AN ENDOSCOPIC PROCEDURE TODAY AT THE [facility]: Refer to the procedure report that was given to you for any specific questions about what was found during the examination.  If the procedure report does not answer your questions, please call your gastroenterologist to clarify.  If you requested that your care partner not be given the details of your procedure findings, then the procedure report has been included in a sealed envelope for you to review at your convenience later.  YOU SHOULD EXPECT: Some feelings of bloating in the abdomen. Passage of more gas than usual.  Walking can help get rid of the air that was put into your GI tract during the procedure and reduce the bloating. If you had a lower endoscopy (such as a colonoscopy or flexible sigmoidoscopy) you may notice spotting of blood in your stool or on the toilet paper. If you underwent a bowel prep for your procedure, then you may not have a normal bowel movement for a few days.  DIET:  NOTHING TO EAT OR DRINK UNTIL 11:30. 11:30 UNTIL 12:30 ONLY CLEAR LIQUIDS. 12:30 UNTIL MORNING ONLY SOFT FOODS. RESUME YOUR REGULAR DIET IN AM.  ACTIVITY: Your care partner should take you home directly after the procedure.  You should plan to take it easy, moving slowly for the rest of the day.  You can resume normal activity the day after the procedure however you should NOT DRIVE or use heavy machinery for 24 hours (because of the sedation medicines used during the test).    SYMPTOMS TO REPORT IMMEDIATELY: A gastroenterologist can be reached at any hour.  During normal business hours, 8:30 AM to 5:00 PM Monday through Friday, call [phone].  After hours and on weekends, please call the GI answering service at [phone] who will take a message and have the physician on call contact you.   Following upper endoscopy (EGD)  Vomiting of blood or coffee ground material  New chest pain or pain under the shoulder  blades  Painful or persistently difficult swallowing  New shortness of breath  Fever of 100F or higher  Black, tarry-looking stools  FOLLOW UP: If any biopsies were taken you will be contacted by phone or by letter within the next 1-3 weeks.  Call your gastroenterologist if you have not heard about the biopsies in 3 weeks.  Our staff will call the home number listed on your records the next business day following your procedure to check on you and address any questions or concerns that you may have at that time regarding the information given to you following your procedure. This is a courtesy call and so if there is no answer at the home number and we have not heard from you through the emergency physician on call, we will assume that you have returned to your regular daily activities without incident.  SIGNATURES/CONFIDENTIALITY: You and/or your care partner have signed paperwork which will be entered into your electronic medical record.  These signatures attest to the fact that that the information above on your After Visit Summary has been reviewed and is understood.  Full responsibility of the confidentiality of this discharge information lies with you and/or your care-partner.

## 2013-05-12 NOTE — Op Note (Signed)
[facility] [address] [place], [address]   ENDOSCOPY PROCEDURE REPORT  PATIENT: Sara [name], Sara [name]  MR#: [id number] BIRTHDATE: Aug 15, 1951 , 62  yrs. old GENDER: Female ENDOSCOPIST: Hart Carwin, MD REFERRED BY:  Sheliah Hatch, M.D. PROCEDURE DATE:  05/12/2013 PROCEDURE:  EGD, diagnostic and Savary dilation of esophagus ASA CLASS:     Class II INDICATIONS:  Heartburn.   Chest pain.   History of esophageal reflux. , pain radiating to left shoulder after meals MEDICATIONS: MAC sedation, administered by CRNA and propofol (Diprivan) 150mg  IV TOPICAL ANESTHETIC: none  DESCRIPTION OF PROCEDURE: After the risks benefits and alternatives of the procedure were thoroughly explained, informed consent was obtained.  The LB [id number] endoscope was introduced through the mouth and advanced to the second portion of the duodenum. Without limitations.  The instrument was slowly withdrawn as the mucosa was fully examined.      Esophagus: Endoscope passed into the proximal esophagus without difficulty. The mucosa appears normal in proximal, mid and distal esophagus, lumen the was angulated distally and somewhat torturous. There was a mild fibrotic benign appearing esophageal stricture at 30 cm from the incisors which allowed the endoscope to traverse without resistance Stomach: There was a 4-5 cm nonreducible large hiatal hernia extending from 30-35 cm from the incisors,there were no Cameron erosions, gastric folds were normal. Gastric antrum and pyloric outlet was unremarkable. Retroflexion of the endoscope reviewed all vital hernia Duodenum: Duodenal bulb and descending duodenum was normal The endoscope was then brought back into the stomach guidewire was placed into the gastric antrum and Savary dilator 48 [place] passed over the guidewire without difficulty there  was low blood on the dilator[         The scope was then withdrawn from the patient  and the procedure completed.  COMPLICATIONS: There were no complications. ENDOSCOPIC IMPRESSION: 4-5 cm nonreducible hiatal hernia Mild esophageal stricture. Status post passage of 16 mm Savary dilator  RECOMMENDATIONS: Patient's symptoms are likely related to gastroesophageal reflux and presence of large hernia. She will follow strict antireflux measures and continue on Prilosec 20 mg twice a day. If the symptoms continue we'll consider a Nissen fundoplication REPEAT EXAM: no  eSigned:  Hart Carwin, MD 05/12/2013 10:38 AM   CC:  PATIENT NAME:  Sara [name], Sara [name] MR#: [id number]

## 2013-05-12 NOTE — Progress Notes (Signed)
Called to room to assist during endoscopic procedure.  Patient ID and intended procedure confirmed with present staff. Received instructions for my participation in the procedure from the performing physician.  

## 2013-05-13 ENCOUNTER — Telehealth: Payer: Self-pay

## 2013-05-13 NOTE — Telephone Encounter (Signed)
  Follow up Call-  Call back number 05/12/2013  Post procedure Call Back phone  # [phone]  Permission to leave phone message Yes     Patient questions:  Do you have a fever, pain , or abdominal swelling? no Pain Score  0 *  Have you tolerated food without any problems? yes  Have you been able to return to your normal activities? yes  Do you have any questions about your discharge instructions: Diet   no Medications  no Follow up visit  no  Do you have questions or concerns about your Care? no  Actions: * If pain score is 4 or above: No action needed, pain <4.

## 2013-05-14 ENCOUNTER — Encounter: Payer: Self-pay | Admitting: Internal Medicine

## 2013-05-14 DIAGNOSIS — M21969 Unspecified acquired deformity of unspecified lower leg: Secondary | ICD-10-CM

## 2013-05-14 DIAGNOSIS — M722 Plantar fascial fibromatosis: Secondary | ICD-10-CM

## 2013-05-18 ENCOUNTER — Other Ambulatory Visit: Payer: Self-pay | Admitting: Internal Medicine

## 2013-05-18 NOTE — Progress Notes (Signed)
Letter of medical necessity created and sent to fax [phone] attention: [facility]) for approval of omeprazole 20 mg twice daily dosing.

## 2013-06-08 ENCOUNTER — Ambulatory Visit: Payer: 59 | Admitting: Internal Medicine

## 2013-07-29 ENCOUNTER — Encounter: Payer: Self-pay | Admitting: *Deleted

## 2013-08-19 ENCOUNTER — Telehealth: Payer: Self-pay | Admitting: General Practice

## 2013-08-19 NOTE — Telephone Encounter (Signed)
It would be unlikely but not impossible.  Pt will need appt to assess/evaluate her sxs.

## 2013-08-19 NOTE — Telephone Encounter (Signed)
Called pt and left a detailed message to have pt call to schedule an appt.

## 2013-08-19 NOTE — Telephone Encounter (Signed)
Pt called stating that she has been having pain in her Left shoulder for the last few months. States that this is worsening and now is going into her neck. Was wondering if you thought that this could possibly be Angina? States she has had an ablasion about 5 years ago with Dr. Graciela Husbands, they will not see her. Please advise

## 2013-08-24 ENCOUNTER — Ambulatory Visit (INDEPENDENT_AMBULATORY_CARE_PROVIDER_SITE_OTHER): Payer: PRIVATE HEALTH INSURANCE | Admitting: Internal Medicine

## 2013-08-24 ENCOUNTER — Encounter: Payer: Self-pay | Admitting: Internal Medicine

## 2013-08-24 VITALS — BP 110/74 | HR 72 | Ht 64.5 in | Wt 158.1 lb

## 2013-08-24 DIAGNOSIS — R079 Chest pain, unspecified: Secondary | ICD-10-CM

## 2013-08-24 DIAGNOSIS — K222 Esophageal obstruction: Secondary | ICD-10-CM

## 2013-08-24 DIAGNOSIS — K449 Diaphragmatic hernia without obstruction or gangrene: Secondary | ICD-10-CM

## 2013-08-24 MED ORDER — PANTOPRAZOLE SODIUM 40 MG PO TBEC: 40.0000 mg | DELAYED_RELEASE_TABLET | Freq: Every day | ORAL | Status: DC

## 2013-08-24 MED ORDER — TRAMADOL HCL 50 MG PO TABS: 50.0000 mg | ORAL_TABLET | ORAL | Status: DC | PRN

## 2013-08-24 NOTE — Patient Instructions (Addendum)
We have sent the following medications to your pharmacy for you to pick up at your convenience: Protonix 40 mg daily Tramadol  We have given you samples of the following medication to take: Nexium  You have been scheduled for a Barium Esophogram at [facility] (1st floor of the hospital) on ________ at ___________. Please arrive 15 minutes prior to your appointment for registration. Make certain not to have anything to eat or drink 6 hours prior to your test. If you need to reschedule for any reason, please contact radiology at [phone] to do so. __________________________________________________________________ A barium swallow is an examination that concentrates on views of the esophagus. This tends to be a double contrast exam (barium and two liquids which, when combined, create a gas to distend the wall of the oesophagus) or single contrast (non-ionic iodine based). The study is usually tailored to your symptoms so a good history is essential. Attention is paid during the study to the form, structure and configuration of the esophagus, looking for functional disorders (such as aspiration, dysphagia, achalasia, motility and reflux) EXAMINATION You may be asked to change into a gown, depending on the type of swallow being performed. A radiologist and radiographer will perform the procedure. The radiologist will advise you of the type of contrast selected for your procedure and direct you during the exam. You will be asked to stand, sit or lie in several different positions and to hold a small amount of fluid in your mouth before being asked to swallow while the imaging is performed .In some instances you may be asked to swallow barium coated marshmallows to assess the motility of a solid food bolus. The exam can be recorded as a digital or video fluoroscopy procedure. POST PROCEDURE It will take 1-2 days for the barium to pass through your system. To facilitate this, it is important,  unless otherwise directed, to increase your fluids for the next 24-48hrs and to resume your normal diet.  This test typically takes about 30 minutes to perform. __________________________________________________________________________________  Dr Beverely Low

## 2013-08-24 NOTE — Progress Notes (Signed)
Sara [name] [birth year] MRN [id number]        History of Present Illness:  This is a 62 year old, white female evaluated for gastroesophageal reflux and left shoulder pain associated with the meals. Upper endoscopy in June 2014 showed 4-5 cm nonreducible hiatal hernia extending from 30-35 cm from the incisors. There was a mild esophageal stricture which was dilated with 16 mm Savary dilator. Upper abdominal ultrasound showed normal gallbladder and poorly visualized liver. He has been on Prilosec 20 mg twice a day with some improvement of her reflux symptoms but she still has the left shoulder pain which is worse in the evening usually after supper and has become worse in that it lasts much longer now for at least 3 or 4 hours at a time. She is asking for something and pain. The pain is not positional or associated with exercise it strictly precipitated by eating solid food   Past Medical History  Diagnosis Date  . SUPRAVENTRICULAR TACHYCARDIA   . PLANTAR FASCIITIS, BILATERAL   . Edema   . Urge incontinence   . ANXIETY   . MYOFASCIAL PAIN SYNDROME   . Hiatal hernia   . Esophageal stricture    Past Surgical History  Procedure Laterality Date  . Cather ablation  04/2007  . Adenosine myoview  03/14/05  . Eye surgery    . Vaginal hysterectomy      reports that she has never smoked. She has never used smokeless tobacco. She reports that she drinks about 4.2 ounces of alcohol per week. She reports that she does not use illicit drugs. family history includes Arthritis in her mother; Breast cancer in her maternal grandmother; Coronary artery disease in her father and paternal grandfather; Hypertension in her mother; Stroke in her paternal grandmother. There is no history of Cancer. No Known Allergies      Review of Systems: Left shoulder pain as described above  The remainder of the 10 point ROS is negative except as outlined in H&P   Physical Exam: General appearance  Well  developed, in no distress. Eyes- non icteric. HEENT nontraumatic, normocephalic. Mouth no lesions, tongue papillated, no cheilosis. Neck supple without adenopathy, thyroid not enlarged, no carotid bruits, no JVD. Lungs Clear to auscultation bilaterally. Left shoulder without focal tenderness. Range of motion is normal there are no wheezes or rales on inspiration Cor normal S1, normal S2, regular rhythm, no murmur,  quiet precordium. Abdomen: Nontender normoactive bowel sounds Rectal: Not done Extremities no pedal edema. Skin no lesions. Neurological alert and oriented x 3. Psychological normal mood and affect.  Assessment and Plan:  Left shoulder pain associated with the eating mostly solids in a patient who has moderate-sized hiatal hernia. I think her shoulder pain is referred referred from her diaphragms. I would like to obtain a barium esophagram with barium tablet assess the motility or any displacement of the esophagus of . diaphragms. I will also switch her from Prilosec to Nexium 40 mg a day. She has already been sleeping with her head of bed elevated. She has reduced size of her evening meals. If the symptoms continue I would consider CT scan of the chest to look for space-occupying lesion in her chest   08/24/2013 Lina Sar

## 2013-08-25 ENCOUNTER — Ambulatory Visit: Payer: PRIVATE HEALTH INSURANCE | Admitting: Family Medicine

## 2013-08-27 ENCOUNTER — Ambulatory Visit (HOSPITAL_COMMUNITY)
Admission: RE | Admit: 2013-08-27 | Discharge: 2013-08-27 | Disposition: A | Discharge status: Home / Self Care | Payer: PRIVATE HEALTH INSURANCE | Source: Ambulatory Visit | Attending: Internal Medicine | Admitting: Internal Medicine

## 2013-08-27 DIAGNOSIS — K219 Gastro-esophageal reflux disease without esophagitis: Secondary | ICD-10-CM | POA: Insufficient documentation

## 2013-08-27 DIAGNOSIS — K449 Diaphragmatic hernia without obstruction or gangrene: Secondary | ICD-10-CM

## 2013-08-27 DIAGNOSIS — R0789 Other chest pain: Secondary | ICD-10-CM | POA: Insufficient documentation

## 2013-08-27 DIAGNOSIS — M25519 Pain in unspecified shoulder: Secondary | ICD-10-CM | POA: Insufficient documentation

## 2013-08-30 ENCOUNTER — Other Ambulatory Visit: Payer: Self-pay | Admitting: *Deleted

## 2013-08-30 DIAGNOSIS — K449 Diaphragmatic hernia without obstruction or gangrene: Secondary | ICD-10-CM

## 2013-09-09 ENCOUNTER — Encounter (INDEPENDENT_AMBULATORY_CARE_PROVIDER_SITE_OTHER): Payer: Self-pay | Admitting: Surgery

## 2013-09-09 ENCOUNTER — Ambulatory Visit (INDEPENDENT_AMBULATORY_CARE_PROVIDER_SITE_OTHER): Payer: PRIVATE HEALTH INSURANCE | Admitting: Surgery

## 2013-09-09 VITALS — BP 116/78 | HR 64 | Temp 98.0°F | Resp 14 | Ht 64.5 in | Wt 159.8 lb

## 2013-09-09 DIAGNOSIS — K449 Diaphragmatic hernia without obstruction or gangrene: Secondary | ICD-10-CM

## 2013-09-09 MED ORDER — HYDROCODONE-ACETAMINOPHEN 5-325 MG PO TABS: 1.0000 | ORAL_TABLET | ORAL | Status: DC | PRN

## 2013-09-09 NOTE — Patient Instructions (Signed)
Nissen Fundoplication Care After Please read the instructions outlined below and refer to this sheet for the next few weeks. These discharge instructions provide you with general information on caring for yourself after you leave the hospital. Your doctor may also give you specific instructions. While your treatment has been planned according to the most current medical practices available, unavoidable complications sometimes happen. If you have any problems or questions after discharge, please call your doctor. ACTIVITY  Take frequent rest periods throughout the day.  Take frequent walks throughout the day. This will help to prevent blood clots.  Continue to do your coughing and deep breathing exercises once you get home. This will help to prevent pneumonia.  No strenuous activities such as heavy lifting, pushing or pulling until after your follow-up visit with your doctor. Do not lift anything heavier than 10 pounds.  Talk with your caregiver about when you may return to work and your exercise routine.  You may shower 2 days after surgery. Pat incisions dry. Do not rub incisions with washcloth or towel.  Do not drive while taking prescription pain medication. NUTRITION  Continue with a liquid diet, or the diet you were directed to take, until your first follow-up visit with your surgeon.  Drink fluids (6-8 glasses a day).  Call your caregiver for persistent nausea (feeling sick to your stomach), vomiting, bloating or difficulty swallowing. ELIMINATION It is very important not to strain during bowel movements. If constipation should occur, you may:  Take a mild laxative (such as Milk of Magnesia).  Add fruit and bran to your diet.  Drink more fluids.  Call your caregiver if constipation is not relieved. FEVER If you feel feverish or have shaking chills, take your temperature. If it is 102 F (38.9 C) or above, call your caregiver. The fever may mean there is an infection. PAIN  CONTROL  If a prescription was given for a pain reliever, please follow your caregiver's directions.  Only take over-the-counter or prescription medicines for pain, discomfort, or fever as directed by your caregiver.  If the pain is not relieved by your medicine, becomes worse, or you have difficulty breathing, call your doctor. INCISION  It is normal for your cuts (incisions) from surgery to have a small amount of drainage for the first 1-2 days. Once the drainage has stopped, leave your incision(s) open to air.  Check your incision(s) and surrounding area daily for any redness, swelling, increased drainage or bleeding. If any of these are present or if the wound edges start to separate, call your doctor.  If you have small adhesive strips in place, they will peel and fall off. (If these strips are covered with a clear bandage, your doctor will tell you when to remove them.)  If you have staples, your caregiver will remove them at the follow-up appointment. Document Released: 07/04/2004 Document Revised: 02/03/2012 Document Reviewed: 10/08/2007 Exit[employer] Patient Information 2014 [employer].  

## 2013-09-09 NOTE — Progress Notes (Signed)
Chief Complaint:  Left shoulder pain and paraesophageal hiatal hernia on upper GI with sliding component  History of Present Illness:  Sara [name] is an 62 y.o. female referred by Dr. Juanda Chance who has been left shoulder pain.  This can bother her at night and may not necessarily be related to meals. The pain is up in her left shoulder. She denies any weakness in her left hand or any neurologic changes suggesting that this could the cervical spine issue. She's been endoscoped by Dr. Dickie La in this showed that his hiatal hernia and on upper GI series it looks like it's cut a significant paraesophageal hiatal hernia component and then may be part of the reason and her pain is more of a striking part of this then actual dysphasia. He can see [name] she's lost about 2 inches in height it there is some tortuosity to her esophagus and also some displacement with this hiatal hernia.  I discussed repair in detail with her and her husband. They're aware of the risks benefits. I gave him a booklet on this as well as treatment pictures on how we will approach this. I indicated that I normally do not like use mesh although she has separate discrete hernia away from her esophagus I might use artificial mesh in that situation. She'll acuity is scheduled and we'll perform this at [facility].    Past Medical History  Diagnosis Date  . SUPRAVENTRICULAR TACHYCARDIA   . PLANTAR FASCIITIS, BILATERAL   . Edema   . Urge incontinence   . ANXIETY   . MYOFASCIAL PAIN SYNDROME   . Hiatal hernia   . Esophageal stricture     Past Surgical History  Procedure Laterality Date  . Cather ablation  04/2007  . Adenosine myoview  03/14/05  . Eye surgery    . Vaginal hysterectomy    . Hernia repair      Current Outpatient Prescriptions  Medication Sig Dispense Refill  . Ascorbic Acid (VITAMIN C) 500 MG tablet Take 500 mg by mouth daily.        Marland Kitchen b complex vitamins tablet Take 1 tablet by mouth daily.        . Cholecalciferol  (VITAMIN D-3) 1000 UNITS CAPS Take 1 capsule by mouth daily.      . Multiple Vitamin (MULTIVITAMIN) tablet Take 1 tablet by mouth daily.        . pantoprazole (PROTONIX) 40 MG tablet Take 1 tablet (40 mg total) by mouth daily.  30 tablet  0   No current facility-administered medications for this visit.   Review of patient's allergies indicates no known allergies. Family History  Problem Relation Age of Onset  . Arthritis Mother   . Hypertension Mother   . Coronary artery disease Father   . Heart disease Father   . Breast cancer Maternal Grandmother   . Cancer Maternal Grandmother     breast  . Stroke Paternal Grandmother   . Coronary artery disease Paternal Grandfather    Social History:   reports that she has never smoked. She has never used smokeless tobacco. She reports that she drinks about 4.2 ounces of alcohol per week. She reports that she does not use illicit drugs.   REVIEW OF SYSTEMS - PERTINENT POSITIVES ONLY:  negative for DVT. She's otherwise been in good health. All else is negative.  Physical Exam:   Blood pressure 116/78, pulse 64, temperature 98 F (36.7 C), temperature source Temporal, resp. rate 14, height 5' 4.5" (1.638 m),  weight 159 lb 12.8 oz (72.485 kg). Body mass index is 27.02 kg/(m^2).  Gen:  WDWN white female NAD  Neurological: Alert and oriented to person, place, and time. Motor and sensory function is grossly intact  Head: Normocephalic and atraumatic.  Eyes: Conjunctivae are normal. Pupils are equal, round, and reactive to light. No scleral icterus.  Neck: Normal range of motion. Neck supple. No tracheal deviation or thyromegaly present.  Cardiovascular:  SR without murmurs or gallops.  No carotid bruits Respiratory: Effort normal.  No respiratory distress. No chest wall tenderness. Breath sounds normal.  No wheezes, rales or rhonchi.  Abdomen:  nontender. GU: Musculoskeletal: Normal range of motion. Extremities are nontender. No cyanosis, edema  or clubbing noted Lymphadenopathy: No cervical, preauricular, postauricular or axillary adenopathy is present Skin: Skin is warm and dry. No rash noted. No diaphoresis. No erythema. No pallor. Pscyh: Normal mood and affect. Behavior is normal. Judgment and thought content normal.   LABORATORY RESULTS: No results found for this or any previous visit (from the past 48 hour(s)).  RADIOLOGY RESULTS: No results found.  Problem List: Patient Active Problem List   Diagnosis Date Noted  . Plantar fasciitis of right foot 04/13/2013  . Metatarsal deformity 04/13/2013  . Routine general medical examination at a health care facility 01/13/2013  . GERD (gastroesophageal reflux disease) 12/04/2012  . Nocturnal leg cramps 12/04/2012  . Influenza-like illness 01/25/2012  . Headache 03/15/2011  . Fatigue 03/15/2011  . MYOFASCIAL PAIN SYNDROME 06/15/2010  . EDEMA 06/15/2010  . ANXIETY 09/06/2009  . PLANTAR FASCIITIS, BILATERAL 09/06/2009  . ACUTE SINUSITIS, UNSPECIFIED 03/01/2009  . URGE INCONTINENCE 03/01/2009  . SUPRAVENTRICULAR TACHYCARDIA 02/17/2009    Assessment & Plan: Probable type II hiatal hernia with possible sliding component. Symptomatic. Will proceed with laparoscopic paraesophageal hiatal hernia repair.    Matt B. Daphine Deutscher, MD, [facility] [phone] beeper [phone]  09/09/2013 10:47 AM

## 2013-09-16 ENCOUNTER — Ambulatory Visit (INDEPENDENT_AMBULATORY_CARE_PROVIDER_SITE_OTHER): Payer: PRIVATE HEALTH INSURANCE | Admitting: General Practice

## 2013-09-16 DIAGNOSIS — Z23 Encounter for immunization: Secondary | ICD-10-CM

## 2013-09-23 NOTE — Progress Notes (Signed)
Need orders in EPIC.  Surgery scheduled for 10/12/13.  Thank You.

## 2013-09-27 ENCOUNTER — Other Ambulatory Visit: Payer: Self-pay | Admitting: *Deleted

## 2013-09-27 MED ORDER — PANTOPRAZOLE SODIUM 40 MG PO TBEC: 40.0000 mg | DELAYED_RELEASE_TABLET | Freq: Every day | ORAL | Status: DC

## 2013-10-06 ENCOUNTER — Encounter (HOSPITAL_COMMUNITY): Payer: Self-pay | Admitting: Pharmacy Technician

## 2013-10-07 ENCOUNTER — Other Ambulatory Visit (HOSPITAL_COMMUNITY): Payer: Self-pay | Admitting: Surgery

## 2013-10-07 ENCOUNTER — Encounter (HOSPITAL_COMMUNITY): Payer: Self-pay

## 2013-10-07 NOTE — Progress Notes (Signed)
Need orders in epic for 10-12-13 surgery, pre op is 10-08-13

## 2013-10-07 NOTE — Patient Instructions (Addendum)
[address]  10/07/2013   Your procedure is scheduled on: Tuesday NOVEMBER 18TH  Report to [facility] at 530 AM.  Call this number if you have problems the morning of surgery [phone]   Remember:   Do not eat food or drink liquids :After Midnight.     Take these medicines the morning of surgery with A SIP OF WATER: no meds to take                                [facility] PREPARING FOR SURGERY SHEET             You may not have any metal on your body including hair pins and piercings  Do not wear jewelry, make-up.  Do not wear lotions, powders, or perfumes. You may wear deodorant.   Men may shave face and neck.  Do not bring valuables to the hospital. [facility] IS NOT RESPONSIBLE FOR VALUEABLES.  Contacts, dentures or bridgework may not be worn into surgery.  Leave suitcase in the car. After surgery it may be brought to your room.  For patients admitted to the hospital, checkout time is 11:00 AM the day of discharge.   Patients discharged the day of surgery will not be allowed to drive home.  Name and phone number of your driver:  Special Instructions: N/A   Please read over the following fact sheets that you were given:   Call Cain Sieve RN pre op nurse if needed [phone]    FAILURE TO FOLLOW THESE INSTRUCTIONS MAY RESULT IN THE CANCELLATION OF YOUR SURGERY.  PATIENT SIGNATURE___________________________________________  NURSE SIGNATURE_____________________________________________

## 2013-10-08 ENCOUNTER — Encounter (HOSPITAL_COMMUNITY): Payer: Self-pay

## 2013-10-08 ENCOUNTER — Encounter (HOSPITAL_COMMUNITY)
Admission: RE | Admit: 2013-10-08 | Discharge: 2013-10-08 | Disposition: A | Discharge status: Home / Self Care | Payer: PRIVATE HEALTH INSURANCE | Source: Ambulatory Visit | Attending: Surgery | Admitting: Surgery

## 2013-10-08 DIAGNOSIS — Z01812 Encounter for preprocedural laboratory examination: Secondary | ICD-10-CM | POA: Insufficient documentation

## 2013-10-08 DIAGNOSIS — Z01810 Encounter for preprocedural cardiovascular examination: Secondary | ICD-10-CM | POA: Insufficient documentation

## 2013-10-08 DIAGNOSIS — Z01818 Encounter for other preprocedural examination: Secondary | ICD-10-CM | POA: Insufficient documentation

## 2013-10-08 HISTORY — DX: Other specified postprocedural states: Z98.890

## 2013-10-08 HISTORY — DX: Other specified postprocedural states: R11.2

## 2013-10-08 LAB — CBC
HCT: 39.3 % (ref 36.0–46.0)
MCH: 30.8 pg (ref 26.0–34.0)
MCHC: 33.6 g/dL (ref 30.0–36.0)
MCV: 91.6 fL (ref 78.0–100.0)
Platelets: 231 10*3/uL (ref 150–400)
RBC: 4.29 MIL/uL (ref 3.87–5.11)
RDW: 12.8 % (ref 11.5–15.5)
WBC: 4.7 10*3/uL (ref 4.0–10.5)

## 2013-10-08 NOTE — Progress Notes (Signed)
Cbc and ekg done pre op 10-08-13

## 2013-10-11 ENCOUNTER — Encounter (HOSPITAL_COMMUNITY): Payer: Self-pay | Admitting: Anesthesiology

## 2013-10-11 ENCOUNTER — Other Ambulatory Visit (INDEPENDENT_AMBULATORY_CARE_PROVIDER_SITE_OTHER): Payer: Self-pay | Admitting: Surgery

## 2013-10-11 NOTE — H&P (Signed)
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 female referred by Dr. Juanda Chance who has been left shoulder pain. This can bother her at night and may not necessarily be related to meals. The pain is up in her left shoulder. She denies any weakness in her left hand or any neurologic changes suggesting that this could the cervical spine issue. She's been endoscoped by Dr. Dickie La in this showed that his hiatal hernia and on upper GI series it looks like it's cut a significant paraesophageal hiatal hernia component and then may be part of the reason and her pain is more of a striking part of this then actual dysphasia. He can see [name] she's lost about 2 inches in height it there is some tortuosity to her esophagus and also some displacement with this hiatal hernia.  I discussed repair in detail with her and her husband. They're aware of the risks benefits. I gave him a booklet on this as well as treatment pictures on how we will approach this. I indicated that I normally do not like use mesh although she has separate discrete hernia away from her esophagus I might use artificial mesh in that situation. She'll acuity is scheduled and we'll perform this at [facility].  Past Medical History   Diagnosis  Date   .  SUPRAVENTRICULAR TACHYCARDIA    .  PLANTAR FASCIITIS, BILATERAL    .  Edema    .  Urge incontinence    .  ANXIETY    .  MYOFASCIAL PAIN SYNDROME    .  Hiatal hernia    .  Esophageal stricture     Past Surgical History   Procedure  Laterality  Date   .  Cather ablation   04/2007   .  Adenosine myoview   03/14/05   .  Eye surgery     .  Vaginal hysterectomy     .  Hernia repair      Current Outpatient Prescriptions   Medication  Sig  Dispense  Refill   .  Ascorbic Acid (VITAMIN C) 500 MG tablet  Take 500 mg by mouth daily.     Marland Kitchen  b complex vitamins tablet  Take 1 tablet by mouth daily.      .  Cholecalciferol (VITAMIN D-3) 1000 UNITS CAPS  Take 1 capsule by mouth daily.     .  Multiple Vitamin (MULTIVITAMIN) tablet  Take 1 tablet by mouth daily.     .  pantoprazole (PROTONIX) 40 MG tablet  Take 1 tablet (40 mg total) by mouth daily.  30 tablet  0    No current facility-administered medications for this visit.   Review of patient's allergies indicates no known allergies.  Family History   Problem  Relation  Age of Onset   .  Arthritis  Mother    .  Hypertension  Mother    .  Coronary artery disease  Father    .  Heart disease  Father    .  Breast cancer  Maternal Grandmother    .  Cancer  Maternal Grandmother      breast   .  Stroke  Paternal Grandmother    .  Coronary artery disease  Paternal Grandfather    Social History: reports that she has never smoked. She has never used smokeless tobacco. She reports that she drinks about 4.2 ounces of  alcohol per week. She reports that she does not use illicit drugs.  REVIEW OF SYSTEMS - PERTINENT POSITIVES ONLY:  negative for DVT. She's otherwise been in good health. All else is negative.  Physical Exam:  Blood pressure 116/78, pulse 64, temperature 98 F (36.7 C), temperature source Temporal, resp. rate 14, height 5' 4.5" (1.638 m), weight 159 lb 12.8 oz (72.485 kg).  Body mass index is 27.02 kg/(m^2).  Gen: WDWN white female NAD  Neurological: Alert and oriented to person, place, and time. Motor and sensory function is grossly intact  Head: Normocephalic and atraumatic.  Eyes: Conjunctivae are normal. Pupils are equal, round, and reactive to light. No scleral icterus.  Neck: Normal range of motion. Neck supple. No tracheal deviation or thyromegaly present.  Cardiovascular: SR without murmurs or gallops. No carotid bruits  Respiratory: Effort normal. No respiratory distress. No chest wall tenderness. Breath sounds normal. No wheezes, rales or rhonchi.  Abdomen: nontender.  GU:  Musculoskeletal: Normal range of motion.  Extremities are nontender. No cyanosis, edema or clubbing noted Lymphadenopathy: No cervical, preauricular, postauricular or axillary adenopathy is present Skin: Skin is warm and dry. No rash noted. No diaphoresis. No erythema. No pallor. Pscyh: Normal mood and affect. Behavior is normal. Judgment and thought content normal.  LABORATORY RESULTS:  No results found for this or any previous visit (from the past 48 hour(s)).  RADIOLOGY RESULTS:  No results found.  Problem List:  Patient Active Problem List    Diagnosis  Date Noted   .  Plantar fasciitis of right foot  04/13/2013   .  Metatarsal deformity  04/13/2013   .  Routine general medical examination at a health care facility  01/13/2013   .  GERD (gastroesophageal reflux disease)  12/04/2012   .  Nocturnal leg cramps  12/04/2012   .  Influenza-like illness  01/25/2012   .  Headache  03/15/2011   .  Fatigue  03/15/2011   .  MYOFASCIAL PAIN SYNDROME  06/15/2010   .  EDEMA  06/15/2010   .  ANXIETY  09/06/2009   .  PLANTAR FASCIITIS, BILATERAL  09/06/2009   .  ACUTE SINUSITIS, UNSPECIFIED  03/01/2009   .  URGE INCONTINENCE  03/01/2009   .  SUPRAVENTRICULAR TACHYCARDIA  02/17/2009   Assessment & Plan:  Probable type II hiatal hernia with possible sliding component. Symptomatic. Will proceed with laparoscopic paraesophageal hiatal hernia repair.  Matt B. Daphine Deutscher, MD, [facility]  [phone] beeper  [phone]

## 2013-10-11 NOTE — Anesthesia Preprocedure Evaluation (Addendum)
Anesthesia Evaluation  Patient identified by MRN, date of birth, ID band Patient awake    Reviewed: Allergy & Precautions, H&P , NPO status , Patient's Chart, lab work & pertinent test results  History of Anesthesia Complications (+) PONV and history of anesthetic complications  Airway Mallampati: II TM Distance: >3 FB Neck ROM: Full    Dental no notable dental hx.    Pulmonary neg pulmonary ROS,  breath sounds clear to auscultation  Pulmonary exam normal       Cardiovascular Exercise Tolerance: Good + dysrhythmias Supra Ventricular Tachycardia Rhythm:Regular Rate:Normal  ECG: NSR with sinus arrhythmia.   Neuro/Psych  Headaches, Anxiety  Neuromuscular disease    GI/Hepatic Neg liver ROS, GERD-  Medicated,  Endo/Other  negative endocrine ROS  Renal/GU negative Renal ROS  negative genitourinary   Musculoskeletal negative musculoskeletal ROS (+)   Abdominal   Peds negative pediatric ROS (+)  Hematology negative hematology ROS (+)   Anesthesia Other Findings   Reproductive/Obstetrics negative OB ROS                          Anesthesia Physical Anesthesia Plan  ASA: II  Anesthesia Plan: General   Post-op Pain Management:    Induction: Intravenous  Airway Management Planned: Oral ETT  Additional Equipment:   Intra-op Plan:   Post-operative Plan: Extubation in OR  Informed Consent: I have reviewed the patients History and Physical, chart, labs and discussed the procedure including the risks, benefits and alternatives for the proposed anesthesia with the patient or authorized representative who has indicated his/her understanding and acceptance.   Dental advisory given  Plan Discussed with: CRNA  Anesthesia Plan Comments:         Anesthesia Quick Evaluation

## 2013-10-12 ENCOUNTER — Encounter (HOSPITAL_COMMUNITY)
Admission: RE | Disposition: A | Discharge status: Home / Self Care | Payer: Self-pay | Source: Ambulatory Visit | Attending: Surgery

## 2013-10-12 ENCOUNTER — Inpatient Hospital Stay (HOSPITAL_COMMUNITY)
Admission: RE | Admit: 2013-10-12 | Discharge: 2013-10-14 | DRG: 328 | Disposition: A | Discharge status: Home / Self Care | Payer: 59 | Source: Ambulatory Visit | Attending: Surgery | Admitting: Surgery

## 2013-10-12 ENCOUNTER — Encounter (HOSPITAL_COMMUNITY): Payer: 59 | Admitting: Anesthesiology

## 2013-10-12 ENCOUNTER — Ambulatory Visit (HOSPITAL_COMMUNITY): Payer: 59 | Admitting: Anesthesiology

## 2013-10-12 ENCOUNTER — Encounter (HOSPITAL_COMMUNITY): Payer: Self-pay | Admitting: *Deleted

## 2013-10-12 DIAGNOSIS — K449 Diaphragmatic hernia without obstruction or gangrene: Principal | ICD-10-CM | POA: Diagnosis present

## 2013-10-12 DIAGNOSIS — IMO0001 Reserved for inherently not codable concepts without codable children: Secondary | ICD-10-CM | POA: Diagnosis present

## 2013-10-12 DIAGNOSIS — Z9889 Other specified postprocedural states: Secondary | ICD-10-CM

## 2013-10-12 DIAGNOSIS — Z79899 Other long term (current) drug therapy: Secondary | ICD-10-CM

## 2013-10-12 DIAGNOSIS — Z803 Family history of malignant neoplasm of breast: Secondary | ICD-10-CM

## 2013-10-12 DIAGNOSIS — F411 Generalized anxiety disorder: Secondary | ICD-10-CM | POA: Diagnosis present

## 2013-10-12 DIAGNOSIS — Z823 Family history of stroke: Secondary | ICD-10-CM

## 2013-10-12 DIAGNOSIS — K219 Gastro-esophageal reflux disease without esophagitis: Secondary | ICD-10-CM | POA: Diagnosis present

## 2013-10-12 DIAGNOSIS — Z8249 Family history of ischemic heart disease and other diseases of the circulatory system: Secondary | ICD-10-CM

## 2013-10-12 DIAGNOSIS — I498 Other specified cardiac arrhythmias: Secondary | ICD-10-CM | POA: Diagnosis present

## 2013-10-12 LAB — CBC
HCT: 36.6 % (ref 36.0–46.0)
MCH: 31.3 pg (ref 26.0–34.0)
MCV: 92.4 fL (ref 78.0–100.0)
Platelets: 188 10*3/uL (ref 150–400)
RDW: 12.8 % (ref 11.5–15.5)
WBC: 12.4 10*3/uL — ABNORMAL HIGH (ref 4.0–10.5)

## 2013-10-12 LAB — CREATININE, SERUM: Creatinine, Ser: 0.64 mg/dL (ref 0.50–1.10)

## 2013-10-12 SURGERY — REPAIR, HERNIA, PARAESOPHAGEAL, LAPAROSCOPIC
Anesthesia: General | Site: Abdomen | Wound class: Clean

## 2013-10-12 MED ORDER — ROCURONIUM BROMIDE 100 MG/10ML IV SOLN
INTRAVENOUS | Status: DC | PRN
  Administered 2013-10-12: 10 mg via INTRAVENOUS
  Administered 2013-10-12: 40 mg via INTRAVENOUS

## 2013-10-12 MED ORDER — BUPIVACAINE LIPOSOME 1.3 % IJ SUSP
20.0000 mL | Freq: Once | INTRAMUSCULAR | Status: AC
  Administered 2013-10-12: 20 mL
  Filled 2013-10-12: qty 20

## 2013-10-12 MED ORDER — HYDROMORPHONE HCL PF 1 MG/ML IJ SOLN
INTRAMUSCULAR | Status: AC
  Filled 2013-10-12: qty 1

## 2013-10-12 MED ORDER — SCOPOLAMINE 1 MG/3DAYS TD PT72
MEDICATED_PATCH | TRANSDERMAL | Status: AC
  Filled 2013-10-12: qty 1

## 2013-10-12 MED ORDER — HEPARIN SODIUM (PORCINE) 5000 UNIT/ML IJ SOLN
5000.0000 [IU] | Freq: Once | INTRAMUSCULAR | Status: AC
  Administered 2013-10-12: 5000 [IU] via SUBCUTANEOUS
  Filled 2013-10-12: qty 1

## 2013-10-12 MED ORDER — MORPHINE SULFATE 2 MG/ML IJ SOLN
1.0000 mg | INTRAMUSCULAR | Status: DC | PRN
  Administered 2013-10-12 – 2013-10-13 (×3): 1 mg via INTRAVENOUS
  Filled 2013-10-12 (×3): qty 1

## 2013-10-12 MED ORDER — HEPARIN SODIUM (PORCINE) 5000 UNIT/ML IJ SOLN
5000.0000 [IU] | Freq: Three times a day (TID) | INTRAMUSCULAR | Status: DC
  Administered 2013-10-12 – 2013-10-14 (×5): 5000 [IU] via SUBCUTANEOUS
  Filled 2013-10-12 (×8): qty 1

## 2013-10-12 MED ORDER — PROPOFOL 10 MG/ML IV BOLUS
INTRAVENOUS | Status: DC | PRN
  Administered 2013-10-12: 17 mg via INTRAVENOUS

## 2013-10-12 MED ORDER — ONDANSETRON HCL 4 MG/2ML IJ SOLN
INTRAMUSCULAR | Status: DC | PRN
  Administered 2013-10-12: 4 mg via INTRAVENOUS

## 2013-10-12 MED ORDER — DEXAMETHASONE SODIUM PHOSPHATE 10 MG/ML IJ SOLN
INTRAMUSCULAR | Status: DC | PRN
  Administered 2013-10-12: 10 mg via INTRAVENOUS

## 2013-10-12 MED ORDER — HYDROCODONE-ACETAMINOPHEN 7.5-325 MG/15ML PO SOLN
10.0000 mL | ORAL | Status: DC | PRN
  Administered 2013-10-12 – 2013-10-14 (×6): 10 mL via ORAL
  Filled 2013-10-12 (×6): qty 15

## 2013-10-12 MED ORDER — LACTATED RINGERS IV SOLN
INTRAVENOUS | Status: DC | PRN
  Administered 2013-10-12: 1000 mL via INTRAVENOUS

## 2013-10-12 MED ORDER — DEXTROSE 5 % IV SOLN
2.0000 g | INTRAVENOUS | Status: AC
  Administered 2013-10-12: 2 g via INTRAVENOUS
  Filled 2013-10-12: qty 2

## 2013-10-12 MED ORDER — EPHEDRINE SULFATE 50 MG/ML IJ SOLN
INTRAMUSCULAR | Status: DC | PRN
  Administered 2013-10-12: 5 mg via INTRAVENOUS

## 2013-10-12 MED ORDER — FENTANYL CITRATE 0.05 MG/ML IJ SOLN
INTRAMUSCULAR | Status: DC | PRN
  Administered 2013-10-12: 100 ug via INTRAVENOUS
  Administered 2013-10-12 (×4): 50 ug via INTRAVENOUS

## 2013-10-12 MED ORDER — ONDANSETRON HCL 4 MG PO TABS: 4.0000 mg | ORAL_TABLET | Freq: Four times a day (QID) | ORAL | Status: DC | PRN

## 2013-10-12 MED ORDER — DEXTROSE 5 % IV SOLN
INTRAVENOUS | Status: AC
  Filled 2013-10-12 (×2): qty 1

## 2013-10-12 MED ORDER — LACTATED RINGERS IV SOLN
INTRAVENOUS | Status: DC | PRN
  Administered 2013-10-12 (×2): via INTRAVENOUS

## 2013-10-12 MED ORDER — SUCCINYLCHOLINE CHLORIDE 20 MG/ML IJ SOLN
INTRAMUSCULAR | Status: DC | PRN
  Administered 2013-10-12: 100 mg via INTRAVENOUS

## 2013-10-12 MED ORDER — LIDOCAINE HCL (CARDIAC) 20 MG/ML IV SOLN
INTRAVENOUS | Status: DC | PRN
  Administered 2013-10-12: 100 mg via INTRAVENOUS

## 2013-10-12 MED ORDER — HYDROMORPHONE HCL PF 1 MG/ML IJ SOLN
0.2500 mg | INTRAMUSCULAR | Status: DC | PRN
  Administered 2013-10-12 (×3): 0.25 mg via INTRAVENOUS

## 2013-10-12 MED ORDER — SCOPOLAMINE 1 MG/3DAYS TD PT72
MEDICATED_PATCH | TRANSDERMAL | Status: DC | PRN
  Administered 2013-10-12: 1 via TRANSDERMAL

## 2013-10-12 MED ORDER — NEOSTIGMINE METHYLSULFATE 1 MG/ML IJ SOLN
INTRAMUSCULAR | Status: DC | PRN
  Administered 2013-10-12: 4 mg via INTRAVENOUS

## 2013-10-12 MED ORDER — KCL IN DEXTROSE-NACL 20-5-0.45 MEQ/L-%-% IV SOLN
INTRAVENOUS | Status: DC
  Administered 2013-10-12: 100 mL/h via INTRAVENOUS
  Filled 2013-10-12 (×6): qty 1000

## 2013-10-12 MED ORDER — PROMETHAZINE HCL 25 MG/ML IJ SOLN: 6.2500 mg | INTRAMUSCULAR | Status: DC | PRN

## 2013-10-12 MED ORDER — ONDANSETRON HCL 4 MG/2ML IJ SOLN: 4.0000 mg | Freq: Four times a day (QID) | INTRAMUSCULAR | Status: DC | PRN

## 2013-10-12 MED ORDER — GLYCOPYRROLATE 0.2 MG/ML IJ SOLN
INTRAMUSCULAR | Status: DC | PRN
  Administered 2013-10-12: 0.6 mg via INTRAVENOUS

## 2013-10-12 MED ORDER — MIDAZOLAM HCL 5 MG/5ML IJ SOLN
INTRAMUSCULAR | Status: DC | PRN
  Administered 2013-10-12: 2 mg via INTRAVENOUS

## 2013-10-12 SURGICAL SUPPLY — 64 items
ADH SKN CLS APL DERMABOND .7 (GAUZE/BANDAGES/DRESSINGS) ×1
APL SKNCLS STERI-STRIP NONHPOA (GAUZE/BANDAGES/DRESSINGS) ×1
APPLIER CLIP ROT 10 11.4 M/L (STAPLE)
APR CLP MED LRG 11.4X10 (STAPLE)
BENZOIN TINCTURE PRP APPL 2/3 (GAUZE/BANDAGES/DRESSINGS) ×2 IMPLANT
CABLE HIGH FREQUENCY MONO STRZ (ELECTRODE) ×2 IMPLANT
CANISTER SUCTION 2500CC (MISCELLANEOUS) ×2 IMPLANT
CLAMP ENDO BABCK 10MM (STAPLE) IMPLANT
CLIP APPLIE ROT 10 11.4 M/L (STAPLE) IMPLANT
COVER SURGICAL LIGHT HANDLE (MISCELLANEOUS) ×1 IMPLANT
DECANTER SPIKE VIAL GLASS SM (MISCELLANEOUS) ×2 IMPLANT
DERMABOND ADVANCED (GAUZE/BANDAGES/DRESSINGS) ×1
DERMABOND ADVANCED .7 DNX12 (GAUZE/BANDAGES/DRESSINGS) IMPLANT
DEVICE SUT QUICK LOAD TK 5 (STAPLE) ×8 IMPLANT
DEVICE SUT TI-KNOT TK 5X26 (MISCELLANEOUS) ×8 IMPLANT
DEVICE SUTURE ENDOST 10MM (ENDOMECHANICALS) ×2 IMPLANT
DISSECTOR BLUNT TIP ENDO 5MM (MISCELLANEOUS) ×2 IMPLANT
DRAIN PENROSE 18X1/2 LTX STRL (DRAIN) ×2 IMPLANT
DRAPE LAPAROSCOPIC ABDOMINAL (DRAPES) ×2 IMPLANT
ELECT REM PT RETURN 9FT ADLT (ELECTROSURGICAL) ×2
ELECTRODE REM PT RTRN 9FT ADLT (ELECTROSURGICAL) ×1 IMPLANT
FELT TEFLON 4 X1 (Mesh General) ×2 IMPLANT
FILTER SMOKE EVAC LAPAROSHD (FILTER) IMPLANT
GLOVE BIOGEL M 8.0 STRL (GLOVE) ×2 IMPLANT
GLOVE BIOGEL PI IND STRL 7.0 (GLOVE) IMPLANT
GLOVE BIOGEL PI IND STRL 7.5 (GLOVE) IMPLANT
GLOVE BIOGEL PI INDICATOR 7.0 (GLOVE)
GLOVE BIOGEL PI INDICATOR 7.5 (GLOVE) ×2
GLOVE SS BIOGEL STRL SZ 7.5 (GLOVE) IMPLANT
GLOVE SUPERSENSE BIOGEL SZ 7.5 (GLOVE) ×1
GLOVE SURG SS PI 8.5 STRL IVOR (GLOVE) ×1
GLOVE SURG SS PI 8.5 STRL STRW (GLOVE) IMPLANT
GOWN PREVENTION PLUS LG XLONG (DISPOSABLE) ×1 IMPLANT
GOWN STRL REIN XL XLG (GOWN DISPOSABLE) ×6 IMPLANT
GRASPER ENDO BABCOCK 10 (MISCELLANEOUS) IMPLANT
GRASPER ENDO BABCOCK 10MM (MISCELLANEOUS)
KIT BASIN OR (CUSTOM PROCEDURE TRAY) ×2 IMPLANT
NS IRRIG 1000ML POUR BTL (IV SOLUTION) ×2 IMPLANT
PENCIL BUTTON HOLSTER BLD 10FT (ELECTRODE) IMPLANT
SCALPEL HARMONIC ACE (MISCELLANEOUS) ×2 IMPLANT
SCISSORS LAP 5X35 DISP (ENDOMECHANICALS) ×2 IMPLANT
SET IRRIG TUBING LAPAROSCOPIC (IRRIGATION / IRRIGATOR) ×2 IMPLANT
SLEEVE ADV FIXATION 5X100MM (TROCAR) ×2 IMPLANT
SLEEVE Z-THREAD 5X100MM (TROCAR) ×1 IMPLANT
SOLUTION ANTI FOG 6CC (MISCELLANEOUS) ×3 IMPLANT
STAPLER VISISTAT 35W (STAPLE) ×2 IMPLANT
STRIP CLOSURE SKIN 1/2X4 (GAUZE/BANDAGES/DRESSINGS) IMPLANT
SUT SURGIDAC NAB ES-9 0 48 120 (SUTURE) ×16 IMPLANT
SUT VIC AB 4-0 SH 18 (SUTURE) ×2 IMPLANT
SYR 30ML LL (SYRINGE) ×2 IMPLANT
TIP INNERVISION DETACH 40FR (MISCELLANEOUS) IMPLANT
TIP INNERVISION DETACH 50FR (MISCELLANEOUS) IMPLANT
TIP INNERVISION DETACH 56FR (MISCELLANEOUS) ×2 IMPLANT
TIPS INNERVISION DETACH 40FR (MISCELLANEOUS)
TRAY FOLEY CATH 14FRSI W/METER (CATHETERS) ×2 IMPLANT
TRAY LAP CHOLE (CUSTOM PROCEDURE TRAY) ×2 IMPLANT
TROCAR ADV FIXATION 11X100MM (TROCAR) ×1 IMPLANT
TROCAR ADV FIXATION 5X100MM (TROCAR) IMPLANT
TROCAR BLADELESS OPT 5 100 (ENDOMECHANICALS) ×2 IMPLANT
TROCAR XCEL BLUNT TIP 100MML (ENDOMECHANICALS) IMPLANT
TROCAR XCEL NON-BLD 11X100MML (ENDOMECHANICALS) IMPLANT
TROCAR XCEL NON-BLD 5MMX100MML (ENDOMECHANICALS) ×2 IMPLANT
TROCAR XCEL UNIV SLVE 11M 100M (ENDOMECHANICALS) IMPLANT
TUBING FILTER THERMOFLATOR (ELECTROSURGICAL) ×2 IMPLANT

## 2013-10-12 NOTE — Op Note (Signed)
Surgeon: Wenda Low, MD, FACS  Asst:  Jaclynn Guarneri, MD, FACS  Anes:  general  Procedure: Laparoscopic repair of type III mixed hiatus hernia (5 sutures posteriorally)  with Nissen fundoplication over a 56 Fr. dilator  Diagnosis: Type III mixed hiatus hernia  Complications: none  EBL:   30 cc  Description of Procedure:  The patient was taken to room 6 and given general anesthesia. The abdomen was prepped with PCMX and draped sterilely. After a timeout access to the abdomen was achieved through the left upper quadrant with a 5 mm Optiview technique without difficulty. Following insufflation 5 mm port was placed on the left the camera which was an angled 5 mm scope and a 1112 placed to the right of the umbilicus along with a second 5 laterally 5 the upper midline for the [facility] retractor. Dr. Johna Sheriff had a second port over on the left laterally.  At least half of the stomach. It up in the large hiatus defect and this was brought down without a lot of tension although the sac was fairly extensive. The dissection began on the right crus and in this was incised taken down to the sac pulled down and resected. The dissection. Cross the midline. We then started on the short gastrics and took those down with the harmonic scalpel and carried this up into the chest and were able to extract the sac. Posteriorly both crura were easily visualized from the right side. The Penrose drain was placed in a retraction and further esophageal dissection which enabled [place] to get the esophagus at length into the abdomen. The hiatus was then approximated with 5 sutures posteriorly using Endo Stitch device securing them with Ty knots. A 56 dilator was passed and was snug at that point. We grabbed with a portion of the cardia fundus from the right and brought around and it was under no tension. I then did some dissection to remove some fat proximally and encounter bleeder which ended up using the Harmonic: A. We did remove  the sac on the left side to clean out the esophagogastric junction. The wrap was then performed using 3 sutures giving purchases not only of stomach but of the periesophageal tissue. A healthy wrap was noted. The bougie was removed and everything looked to be in order. Ports were injected with Exparel and following removal of the Nathanson retractor and decompression the skin was closed with 4-0 Vicryl and with Dermabond. The patient our the procedure well taken recovery room in satisfactory condition.  Matt B. Daphine Deutscher, MD, [facility], [place] [phone]

## 2013-10-12 NOTE — Interval H&P Note (Signed)
History and Physical Interval Note:  10/12/2013 7:21 AM  Sara [name]  has presented today for surgery, with the diagnosis of paraesophageal hernia  The various methods of treatment have been discussed with the patient and family. After consideration of risks, benefits and other options for treatment, the patient has consented to  Procedure(s): LAPAROSCOPIC PARAESOPHAGEAL HERNIA REPAIR (N/A) as a surgical intervention .  The patient's history has been reviewed, patient examined, no change in status, stable for surgery.  I have reviewed the patient's chart and labs.  Questions were answered to the patient's satisfaction.     Shereda Graw B

## 2013-10-12 NOTE — Anesthesia Postprocedure Evaluation (Signed)
  Anesthesia Post-op Note  Patient: Sara [name]  Procedure(s) Performed: Procedure(s) (LRB): LAPAROSCOPIC PARAESOPHAGEAL HERNIA REPAIR (N/A)  Patient Location: PACU  Anesthesia Type: General  Level of Consciousness: awake and alert   Airway and Oxygen Therapy: Patient Spontanous Breathing  Post-op Pain: mild  Post-op Assessment: Post-op Vital signs reviewed, Patient's Cardiovascular Status Stable, Respiratory Function Stable, Patent Airway and No signs of Nausea or vomiting  Last Vitals:  Filed Vitals:   10/12/13 1400  BP: 142/80  Pulse: 61  Temp: 36.4 C  Resp: 20    Post-op Vital Signs: stable   Complications: No apparent anesthesia complications

## 2013-10-12 NOTE — Transfer of Care (Signed)
Immediate Anesthesia Transfer of Care Note  Patient: Sara [name]  Procedure(s) Performed: Procedure(s): LAPAROSCOPIC PARAESOPHAGEAL HERNIA REPAIR (N/A)  Patient Location: PACU  Anesthesia Type:General  Level of Consciousness: sedated  Airway & Oxygen Therapy: Patient Spontanous Breathing and Patient connected to face mask oxygen  Post-op Assessment: Report given to PACU RN and Post -op Vital signs reviewed and stable  Post vital signs: Reviewed and stable  Complications: No apparent anesthesia complications

## 2013-10-13 ENCOUNTER — Inpatient Hospital Stay (HOSPITAL_COMMUNITY): Payer: 59

## 2013-10-13 LAB — CBC
HCT: 36.2 % (ref 36.0–46.0)
Hemoglobin: 11.9 g/dL — ABNORMAL LOW (ref 12.0–15.0)
MCHC: 32.9 g/dL (ref 30.0–36.0)
MCV: 92.1 fL (ref 78.0–100.0)
RDW: 12.7 % (ref 11.5–15.5)

## 2013-10-13 LAB — BASIC METABOLIC PANEL
BUN: 6 mg/dL (ref 6–23)
Chloride: 104 mEq/L (ref 96–112)
Creatinine, Ser: 0.68 mg/dL (ref 0.50–1.10)
GFR calc Af Amer: >90 mL/min (ref >90)
GFR calc non Af Amer: >90 mL/min (ref >90)
Glucose, Bld: 136 mg/dL — ABNORMAL HIGH (ref 70–99)

## 2013-10-13 MED ORDER — IOHEXOL 300 MG/ML  SOLN
50.0000 mL | Freq: Once | INTRAMUSCULAR | Status: AC | PRN
  Administered 2013-10-13: 50 mL via ORAL

## 2013-10-13 NOTE — Progress Notes (Signed)
Patient ID: Sara [name], female   DOB: 14-Aug-1951, 62 y.o.   MRN: [id number] [place]  Surgery Progress Note:   1 Day Post-Op  Subjective: Mental status is clear Objective: Vital signs in last 24 hours: Temp:  [97.5 F (36.4 C)-98.3 F (36.8 C)] 97.6 F (36.4 C) (11/19 0513) Pulse Rate:  [61-68] 65 (11/19 0513) Resp:  [17-20] 17 (11/19 0513) BP: (109-146)/(59-83) 109/59 mmHg (11/19 0513) SpO2:  [96 %-99 %] 96 % (11/19 0513) Weight:  [153 lb 7 oz (69.6 kg)] 153 lb 7 oz (69.6 kg) (11/18 2238)  Intake/Output from previous day: 11/18 0701 - 11/19 0700 In: 3483.3 [I.V.:3483.3] Out: 1951 [Urine:1951] Intake/Output this shift:    Physical Exam: Work of breathing is normal.  Minimal abdominal pain.  Some shoulder discomfort  Lab Results:  Results for orders placed during the hospital encounter of 10/12/13 (from the past 48 hour(s))  CBC     Status: Abnormal   Collection Time    10/12/13 12:21 PM      Result Value Range   WBC 12.4 (*) 4.0 - 10.5 K/uL   RBC 3.96  3.87 - 5.11 MIL/uL   Hemoglobin 12.4  12.0 - 15.0 g/dL   HCT 78.2  95.6 - 21.3 %   MCV 92.4  78.0 - 100.0 fL   MCH 31.3  26.0 - 34.0 pg   MCHC 33.9  30.0 - 36.0 g/dL   RDW 08.6  57.8 - 46.9 %   Platelets 188  150 - 400 K/uL  CREATININE, SERUM     Status: None   Collection Time    10/12/13 12:21 PM      Result Value Range   Creatinine, Ser 0.64  0.50 - 1.10 mg/dL   GFR calc non Af Amer >90  >90 mL/min   GFR calc Af Amer >90  >90 mL/min   Comment: (NOTE)     The eGFR has been calculated using the CKD EPI equation.     This calculation has not been validated in all clinical situations.     eGFR's persistently <90 mL/min signify possible Chronic Kidney     Disease.  CBC     Status: Abnormal   Collection Time    10/13/13  5:10 AM      Result Value Range   WBC 10.4  4.0 - 10.5 K/uL   RBC 3.93  3.87 - 5.11 MIL/uL   Hemoglobin 11.9 (*) 12.0 - 15.0 g/dL   HCT 62.9  52.8 - 41.3 %   MCV 92.1  78.0 - 100.0 fL   MCH 30.3  26.0 - 34.0 pg   MCHC 32.9  30.0 - 36.0 g/dL   RDW 24.4  01.0 - 27.2 %   Platelets 219  150 - 400 K/uL  BASIC METABOLIC PANEL     Status: Abnormal   Collection Time    10/13/13  5:10 AM      Result Value Range   Sodium 137  135 - 145 mEq/L   Potassium 5.0  3.5 - 5.1 mEq/L   Chloride 104  96 - 112 mEq/L   CO2 27  19 - 32 mEq/L   Glucose, Bld 136 (*) 70 - 99 mg/dL   BUN 6  6 - 23 mg/dL   Creatinine, Ser 5.36  0.50 - 1.10 mg/dL   Calcium 9.4  8.4 - 64.4 mg/dL   GFR calc non Af Amer >90  >90 mL/min   GFR calc Af Amer >90  >90 mL/min  Comment: (NOTE)     The eGFR has been calculated using the CKD EPI equation.     This calculation has not been validated in all clinical situations.     eGFR's persistently <90 mL/min signify possible Chronic Kidney     Disease.    Radiology/Results: Dg Ugi W/water Sol Cm  10/13/2013   CLINICAL DATA:  Patient status post hiatal hernia repair.  EXAM: WATER SOLUBLE UPPER GI SERIES  TECHNIQUE: Single-column upper GI series was performed using water soluble contrast.  CONTRAST:  50mL OMNIPAQUE IOHEXOL 300 MG/ML  SOLN  COMPARISON:  Upper GI 08/27/2013  FLUOROSCOPY TIME:  1 min 4 seconds  FINDINGS: Patient was administered approximately 50 cc of water-soluble contrast orally. Contrast flowed readily through the gas esophageal junction which is below the diaphragm. The hernia has been reduced below the diaphragm. There is a small amount of contrast pooling within the fundoplication wrap. Contrast flows through the body of the stomach into the duodenum.  IMPRESSION: 1. No evidence of obstruction or leak of the water-soluble contrast following hernia repair.  2.  Small amount of contrast pools within the fundoplication wrap.  3.  Contrast flows into the duodenum readily.  This was made a call report.   Electronically Signed   By: Genevive Bi M.D.   On: 10/13/2013 11:17    Anti-infectives: Anti-infectives   Start     Dose/Rate Route Frequency Ordered  Stop   10/12/13 0600  cefOXitin (MEFOXIN) 2 g in dextrose 5 % 50 mL IVPB     2 g 100 mL/hr over 30 Minutes Intravenous On call to O.R. 10/12/13 0552 10/12/13 0745      Assessment/Plan: Problem List: Patient Active Problem List   Diagnosis Date Noted  . Lap Repair type III Hiatus Hernia with Nissen over #56 Dilator Nov 2014 10/12/2013    Priority: High  . Plantar fasciitis of right foot 04/13/2013  . Metatarsal deformity 04/13/2013  . Routine general medical examination at a health care facility 01/13/2013  . Nocturnal leg cramps 12/04/2012  . Influenza-like illness 01/25/2012  . Headache 03/15/2011  . Fatigue 03/15/2011  . MYOFASCIAL PAIN SYNDROME 06/15/2010  . EDEMA 06/15/2010  . ANXIETY 09/06/2009  . PLANTAR FASCIITIS, BILATERAL 09/06/2009  . ACUTE SINUSITIS, UNSPECIFIED 03/01/2009  . URGE INCONTINENCE 03/01/2009  . SUPRAVENTRICULAR TACHYCARDIA 02/17/2009    Swallow OK.  Will begin clear liquids PO 1 Day Post-Op    LOS: 1 day   Matt B. Daphine Deutscher, MD, [facility] [phone] beeper [phone]  10/13/2013 11:50 AM

## 2013-10-14 LAB — BASIC METABOLIC PANEL
BUN: 6 mg/dL (ref 6–23)
Calcium: 8.9 mg/dL (ref 8.4–10.5)
Chloride: 105 mEq/L (ref 96–112)
GFR calc Af Amer: >90 mL/min (ref >90)
GFR calc non Af Amer: 89 mL/min — ABNORMAL LOW (ref >90)
Glucose, Bld: 98 mg/dL (ref 70–99)
Potassium: 4.5 mEq/L (ref 3.5–5.1)

## 2013-10-14 LAB — CBC
HCT: 35.5 % — ABNORMAL LOW (ref 36.0–46.0)
Hemoglobin: 11.4 g/dL — ABNORMAL LOW (ref 12.0–15.0)
MCH: 30.2 pg (ref 26.0–34.0)
MCHC: 32.1 g/dL (ref 30.0–36.0)
MCV: 94.2 fL (ref 78.0–100.0)
RDW: 13.2 % (ref 11.5–15.5)

## 2013-10-14 MED ORDER — HYDROCODONE-ACETAMINOPHEN 7.5-325 MG/15ML PO SOLN: 10.0000 mL | ORAL | Status: DC | PRN

## 2013-10-14 MED ORDER — ONDANSETRON HCL 4 MG PO TABS: 4.0000 mg | ORAL_TABLET | Freq: Four times a day (QID) | ORAL | Status: DC | PRN

## 2013-10-14 NOTE — Progress Notes (Signed)
Discharge instructions given to pt, verbalized understanding. Left the unit in stable condition. 

## 2013-10-14 NOTE — Discharge Summary (Signed)
Physician Discharge Summary  Patient ID: Sara [name] MRN: [id number] DOB/AGE: 03/06/1951 62 y.o.  Admit date: 10/12/2013 Discharge date: 10/14/2013  Admission Diagnoses:  Large hiatus hernia  Discharge Diagnoses:  Large type III hiatus hernia   Active Problems:   Lap Repair type III Hiatus Hernia with Nissen over #56 Dilator Nov 2014   Surgery:  Lap repair of hiatus hernia with Nissen  Discharged Condition: improved   Hospital Course:   Had surgery.  On PD 1 she had UGI that was good.  Taking liquids and ready for discharge on PD 2  Consults: none  Significant Diagnostic Studies: UGI    Discharge Exam: Blood pressure 128/78, pulse 66, temperature 98.8 F (37.1 C), temperature source Oral, resp. rate 17, height 5\' 4"  (1.626 m), weight 153 lb 7 oz (69.6 kg), SpO2 95.00%. Minimal pain.  Doing well with liquids  Disposition:   Discharge Orders   Future Appointments Provider Department Dept Phone   11/05/2013 3:10 PM Valarie Merino, MD [facility], [place] [phone]   Future Orders Complete By Expires   Diet - low sodium heart healthy  As directed    Discharge instructions  As directed    Comments:     Liquids only by mouth for a week then pureed diet for 4 weeks Hold off on vitamins for a week then restart Zofran if needed for nausea   Increase activity slowly  As directed    No dressing needed  As directed        Medication List    STOP taking these medications       b complex vitamins tablet     co-enzyme Q-10 30 MG capsule     HYDROcodone-acetaminophen 5-325 MG per tablet  Commonly known as:  NORCO/VICODIN  Replaced by:  HYDROcodone-acetaminophen 7.5-325 mg/15 ml solution     ibuprofen 200 MG tablet  Commonly known as:  ADVIL,MOTRIN     multivitamin tablet     vitamin C 500 MG tablet  Commonly known as:  ASCORBIC ACID     Vitamin D-3 1000 UNITS Caps      TAKE these medications       HYDROcodone-acetaminophen 7.5-325 mg/15 ml  solution  Commonly known as:  HYCET  Take 10 mLs by mouth every 3 (three) hours as needed for moderate pain.     ondansetron 4 MG tablet  Commonly known as:  ZOFRAN  Take 1 tablet (4 mg total) by mouth every 6 (six) hours as needed for nausea.     pantoprazole 40 MG tablet  Commonly known as:  PROTONIX  Take 40 mg by mouth every evening.           Follow-up Information   Follow up with Valarie Merino, MD. (see appt)    Specialty:  General Surgery   Contact information:   [address] [place] [address] [phone]       Signed: Valarie Merino 10/14/2013, 8:45 AM

## 2013-11-05 ENCOUNTER — Encounter (INDEPENDENT_AMBULATORY_CARE_PROVIDER_SITE_OTHER): Payer: Self-pay

## 2013-11-05 ENCOUNTER — Encounter (INDEPENDENT_AMBULATORY_CARE_PROVIDER_SITE_OTHER): Payer: Self-pay | Admitting: Surgery

## 2013-11-05 ENCOUNTER — Ambulatory Visit (INDEPENDENT_AMBULATORY_CARE_PROVIDER_SITE_OTHER): Payer: 59 | Admitting: Surgery

## 2013-11-05 VITALS — BP 136/88 | HR 64 | Resp 14 | Ht 64.5 in | Wt 150.6 lb

## 2013-11-05 DIAGNOSIS — Z09 Encounter for follow-up examination after completed treatment for conditions other than malignant neoplasm: Secondary | ICD-10-CM

## 2013-11-05 DIAGNOSIS — Z9889 Other specified postprocedural states: Secondary | ICD-10-CM

## 2013-11-05 NOTE — Patient Instructions (Addendum)
Thanks for your patience.  If you need further assistance after leaving the office, please call our office and speak with a [facility] nurse.  [phone].  If you want to leave a message for Dr. Daphine Deutscher, please call his office phone at [phone].  You had a laparoscopic repair of a large hiatus hernia with a Nissen fundoplication.

## 2013-11-05 NOTE — Progress Notes (Signed)
Sara [name] 62 y.o.  Body mass index is 25.46 kg/(m^2).  Patient Active Problem List   Diagnosis Date Noted  . Lap Repair type III Hiatus Hernia with Nissen over #56 Dilator Nov 2014 10/12/2013    Priority: High  . Plantar fasciitis of right foot 04/13/2013  . Metatarsal deformity 04/13/2013  . Routine general medical examination at a health care facility 01/13/2013  . Nocturnal leg cramps 12/04/2012  . Influenza-like illness 01/25/2012  . Headache 03/15/2011  . Fatigue 03/15/2011  . MYOFASCIAL PAIN SYNDROME 06/15/2010  . EDEMA 06/15/2010  . ANXIETY 09/06/2009  . PLANTAR FASCIITIS, BILATERAL 09/06/2009  . ACUTE SINUSITIS, UNSPECIFIED 03/01/2009  . URGE INCONTINENCE 03/01/2009  . 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 Tramadol Nausea And Vomiting    Dizzy also    Past Surgical History  Procedure Laterality Date  . Cather ablation  04/2007  . Adenosine myoview  03/14/05  . Eye surgery Bilateral 10-2010    cataracts with lens replacments  . Vaginal hysterectomy  yrs ago  . Hernia repair  15 yrs ago   Neena Rhymes, MD No diagnosis found.  Doing very well after her Nissen fundoplication and repair of large type III hiatus hernia.  She is advancing her diet. No problems 3 weeks out.    Will see her back in 2 months Matt B. Daphine Deutscher, MD, [facility] [phone] beeper [phone]  11/05/2013 4:11 PM

## 2014-01-05 IMAGING — RF DG UGI W/ GASTROGRAFIN
11 series · 11 of 11 positions shown · IV contrast (omnipaque)
Comparison: Upper GI 08/27/2013

FLUOROSCOPY TIME:  1 min 4 seconds

CLINICAL DATA: Patient status post hiatal hernia repair.

EXAM:
WATER SOLUBLE UPPER GI SERIES
TECHNIQUE: Single-column upper GI series was performed using water soluble
contrast.
CONTRAST:  50mL OMNIPAQUE IOHEXOL 300 MG/ML  SOLN

[Series 1: run · 1 of 1 slices shown (1 of 10)]
[im 1/1]
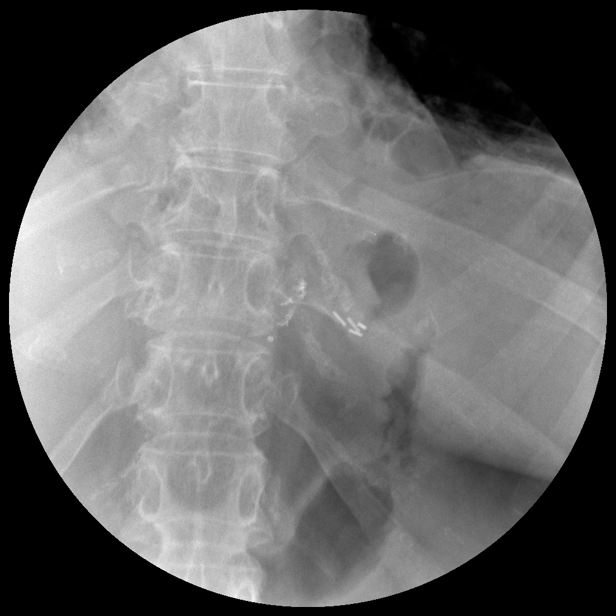

[Series 2: run · 1 of 1 slices shown (2 of 10)]
[im 1/1]
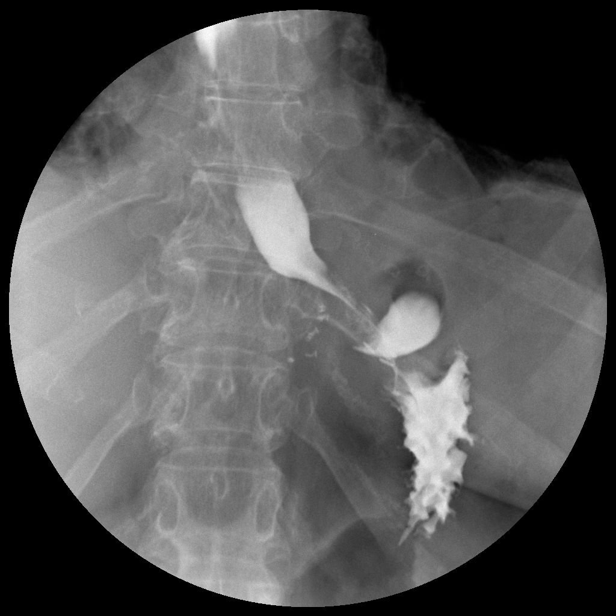

[Series 3: run · 1 of 1 slices shown (3 of 10)]
[im 1/1]
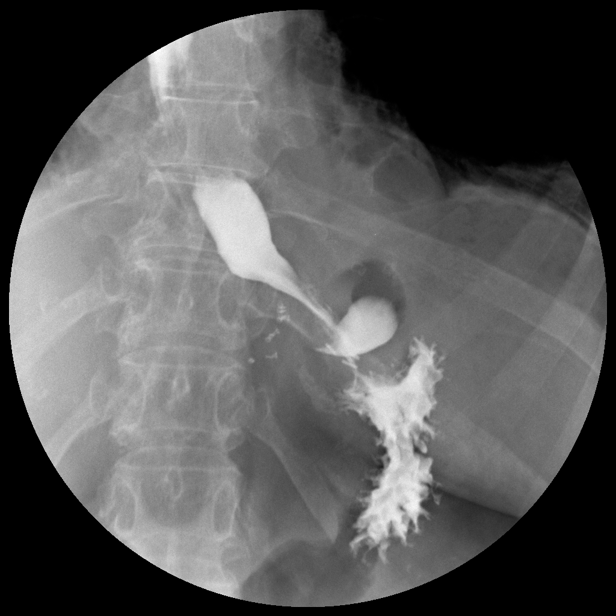

[Series 4: run · 1 of 1 slices shown (4 of 10)]
[im 1/1]
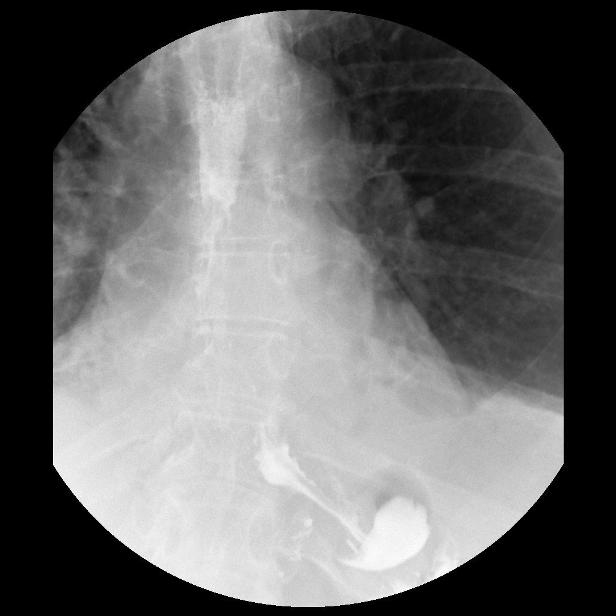

[Series 5: run · 1 of 1 slices shown (5 of 10)]
[im 1/1]
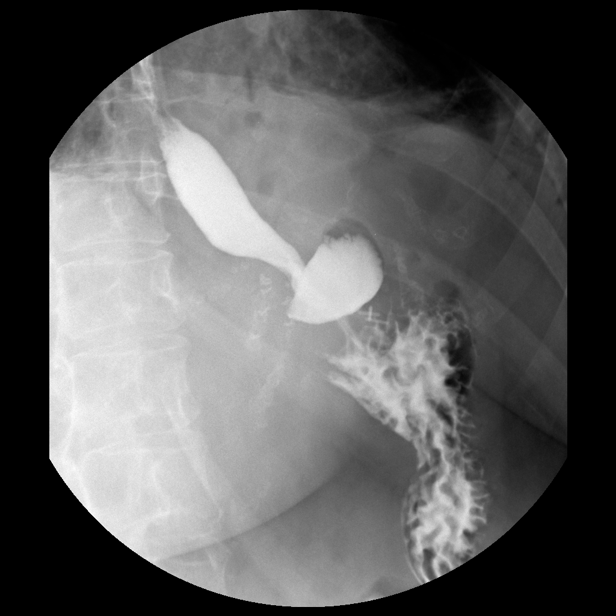

[Series 6: run · 1 of 1 slices shown (6 of 10)]
[im 1/1]
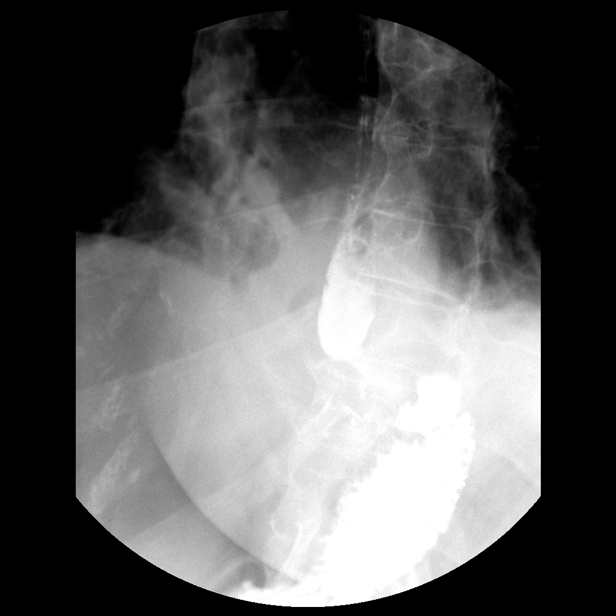

[Series 7: run · 1 of 1 slices shown (7 of 10)]
[im 1/1]
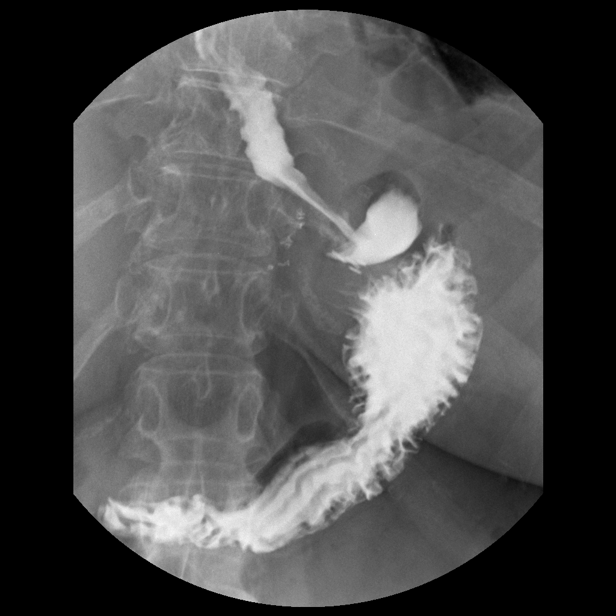

[Series 8: run · 1 of 1 slices shown (8 of 10)]
[im 1/1]
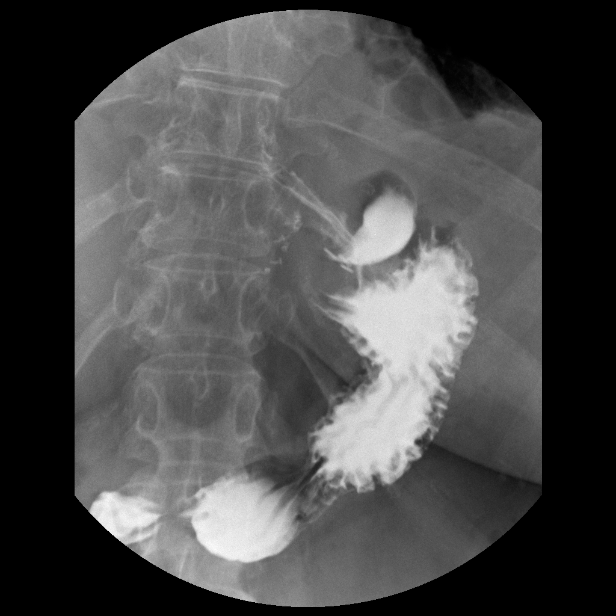

[Series 9: run · 1 of 1 slices shown (9 of 10)]
[im 1/1]
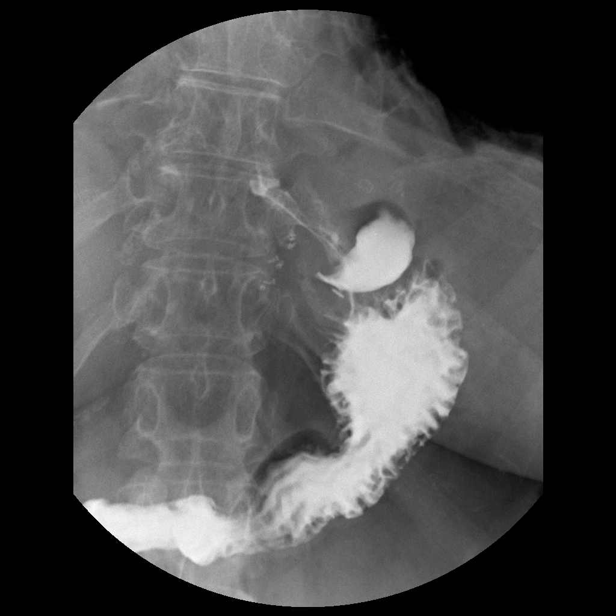

[Series 10: run · 1 of 1 slices shown (10 of 10)]
[im 1/1]
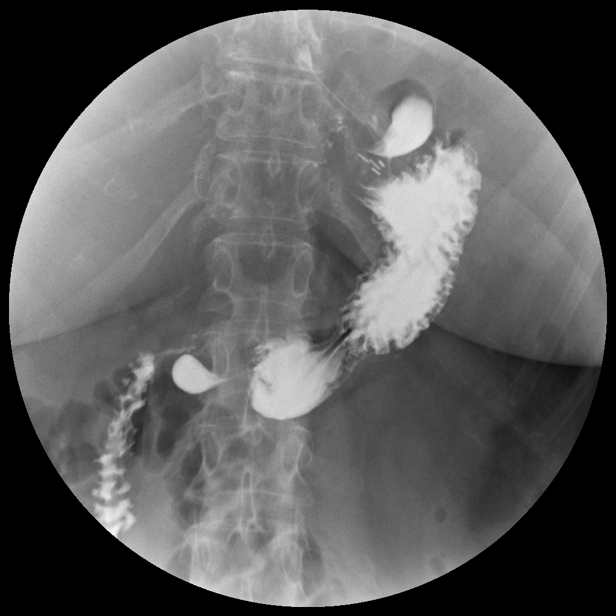

[Series 1001: view not recorded · 0.20mm/px · 1 of 1 slices shown]
[im 1/1]
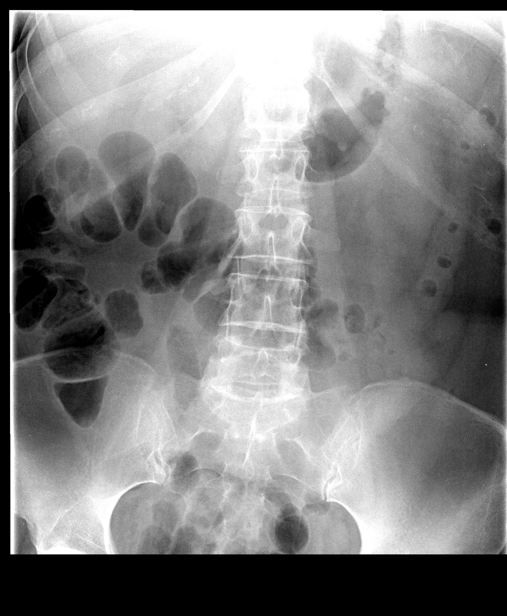

[11 of 11 positions shown; findings below may reference images not displayed]

FINDINGS: Patient was administered approximately 50 cc of water-soluble
contrast orally. Contrast flowed readily through the gas esophageal
junction which is below the diaphragm. The hernia has been reduced
below the diaphragm. There is a small amount of contrast pooling
within the fundoplication wrap. Contrast flows through the body of
the stomach into the duodenum.
IMPRESSION: 1. No evidence of obstruction or leak of the water-soluble contrast
following hernia repair.

2.  Small amount of contrast pools within the fundoplication wrap.

3.  Contrast flows into the duodenum readily.

This was made a call report.

## 2014-01-12 ENCOUNTER — Encounter (INDEPENDENT_AMBULATORY_CARE_PROVIDER_SITE_OTHER): Payer: Self-pay | Admitting: Surgery

## 2014-01-12 ENCOUNTER — Ambulatory Visit (INDEPENDENT_AMBULATORY_CARE_PROVIDER_SITE_OTHER): Payer: 59 | Admitting: Surgery

## 2014-01-12 VITALS — BP 140/76 | HR 88 | Temp 98.7°F | Resp 14 | Ht 65.0 in | Wt 151.4 lb

## 2014-01-12 DIAGNOSIS — Z09 Encounter for follow-up examination after completed treatment for conditions other than malignant neoplasm: Secondary | ICD-10-CM

## 2014-01-12 DIAGNOSIS — Z9889 Other specified postprocedural states: Secondary | ICD-10-CM

## 2014-01-12 NOTE — Patient Instructions (Signed)
Thanks for your patience.  If you need further assistance after leaving the office, please call our office and speak with a [facility] nurse.  [phone].  If you want to leave a message for Dr. Cortlan Dolin, please call his office phone at [phone]. 

## 2014-01-12 NOTE — Progress Notes (Signed)
Sara [name] 63 y.o.  Body mass index is 25.19 kg/(m^2).  Patient Active Problem List   Diagnosis Date Noted  . Lap Repair type III Hiatus Hernia with Nissen over #56 Dilator Nov 2014 10/12/2013    Priority: High  . Plantar fasciitis of right foot 04/13/2013  . Metatarsal deformity 04/13/2013  . Routine general medical examination at a health care facility 01/13/2013  . Nocturnal leg cramps 12/04/2012  . Influenza-like illness 01/25/2012  . Headache 03/15/2011  . Fatigue 03/15/2011  . MYOFASCIAL PAIN SYNDROME 06/15/2010  . EDEMA 06/15/2010  . ANXIETY 09/06/2009  . PLANTAR FASCIITIS, BILATERAL 09/06/2009  . ACUTE SINUSITIS, UNSPECIFIED 03/01/2009  . URGE INCONTINENCE 03/01/2009  . 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 Tramadol Nausea And Vomiting    Dizzy also    Past Surgical History  Procedure Laterality Date  . Cather ablation  04/2007  . Adenosine myoview  03/14/05  . Eye surgery Bilateral 10-2010    cataracts with lens replacments  . Vaginal hysterectomy  yrs ago  . Hernia repair  15 yrs ago   Sara Asa, Sara [name] No diagnosis found.  Doing very well after laparoscopic repair of type III hiatus hernia.  No more shoulder pain.  No GER.  Encouraged 2 let her  bed back down and sleep flat.  Overall she is doing well and I can see prn.   Sara B. Hassell Done, Sara [name], [facility] [phone] beeper [phone]  01/12/2014 3:54 PM

## 2014-01-28 ENCOUNTER — Ambulatory Visit (INDEPENDENT_AMBULATORY_CARE_PROVIDER_SITE_OTHER): Payer: 59 | Admitting: Family Medicine

## 2014-01-28 ENCOUNTER — Encounter: Payer: Self-pay | Admitting: Family Medicine

## 2014-01-28 VITALS — BP 136/80 | HR 72 | Temp 97.9°F | Resp 16 | Wt 152.0 lb

## 2014-01-28 DIAGNOSIS — M62838 Other muscle spasm: Secondary | ICD-10-CM | POA: Insufficient documentation

## 2014-01-28 MED ORDER — NAPROXEN 500 MG PO TABS: 500.0000 mg | ORAL_TABLET | Freq: Two times a day (BID) | ORAL | Status: DC

## 2014-01-28 MED ORDER — CYCLOBENZAPRINE HCL 10 MG PO TABS: 10.0000 mg | ORAL_TABLET | Freq: Three times a day (TID) | ORAL | Status: DC | PRN

## 2014-01-28 NOTE — Patient Instructions (Signed)
Schedule your physical at your convenience Start the Naproxen twice daily- take w/ food- for 7-10 days and then as needed Use the flexeril (muscle relaxer) at night- will cause drowsiness HEAT! Call with any questions or concerns Hang in there!!

## 2014-01-28 NOTE — Assessment & Plan Note (Signed)
Pt's pain and stiffness consistent w/ muscle spasm.  Start scheduled NSAIDs.  Flexeril prn.  Heat.  Reviewed supportive care and red flags that should prompt return.  Pt expressed understanding and is in agreement w/ plan.
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
## 2014-01-28 NOTE — Progress Notes (Signed)
Pre visit review using our clinic review tool, if applicable. No additional management support is needed unless otherwise documented below in the visit note. 

## 2014-03-18 ENCOUNTER — Telehealth: Payer: Self-pay

## 2014-03-18 NOTE — Telephone Encounter (Signed)
Left message for call back Non-identifiable   Pap- 11/25/06- normal CCS- 10/17/08- normal MMG- 01/05/13- negative BD- 07/04/11- Low bone mass Flu- 09/16/13 Td- 09/06/09 Z- 01/23/10

## 2014-03-21 ENCOUNTER — Ambulatory Visit (INDEPENDENT_AMBULATORY_CARE_PROVIDER_SITE_OTHER): Payer: 59 | Admitting: Family Medicine

## 2014-03-21 ENCOUNTER — Encounter: Payer: Self-pay | Admitting: Family Medicine

## 2014-03-21 VITALS — BP 130/82 | HR 54 | Temp 97.9°F | Resp 17 | Ht 64.5 in | Wt 147.2 lb

## 2014-03-21 DIAGNOSIS — Z0000 Encounter for general adult medical examination without abnormal findings: Secondary | ICD-10-CM

## 2014-03-21 DIAGNOSIS — Z23 Encounter for immunization: Secondary | ICD-10-CM

## 2014-03-21 LAB — BASIC METABOLIC PANEL
BUN: 16 mg/dL (ref 6–23)
CALCIUM: 8.9 mg/dL (ref 8.4–10.5)
CO2: 27 mEq/L (ref 19–32)
Chloride: 105 mEq/L (ref 96–112)
Creatinine, Ser: 0.6 mg/dL (ref 0.4–1.2)
GFR: 99.2 mL/min (ref >60.00)
GLUCOSE: 91 mg/dL (ref 70–99)
Potassium: 4.3 mEq/L (ref 3.5–5.1)
Sodium: 140 mEq/L (ref 135–145)

## 2014-03-21 LAB — CBC WITH DIFFERENTIAL/PLATELET
BASOS ABS: 0 10*3/uL (ref 0.0–0.1)
BASOS PCT: 0.7 % (ref 0.0–3.0)
Eosinophils Absolute: 0.2 10*3/uL (ref 0.0–0.7)
Eosinophils Relative: 3.5 % (ref 0.0–5.0)
HCT: 38 % (ref 36.0–46.0)
HEMOGLOBIN: 12.6 g/dL (ref 12.0–15.0)
LYMPHS PCT: 39.4 % (ref 12.0–46.0)
Lymphs Abs: 1.8 10*3/uL (ref 0.7–4.0)
MCHC: 33.2 g/dL (ref 30.0–36.0)
MCV: 93.2 fl (ref 78.0–100.0)
Monocytes Absolute: 0.4 10*3/uL (ref 0.1–1.0)
Monocytes Relative: 8.2 % (ref 3.0–12.0)
NEUTROS ABS: 2.2 10*3/uL (ref 1.4–7.7)
Neutrophils Relative %: 48.2 % (ref 43.0–77.0)
Platelets: 218 10*3/uL (ref 150.0–400.0)
RBC: 4.08 Mil/uL (ref 3.87–5.11)
RDW: 14.1 % (ref 11.5–14.6)
WBC: 4.5 10*3/uL (ref 4.5–10.5)

## 2014-03-21 LAB — LIPID PANEL
CHOLESTEROL: 172 mg/dL (ref 0–200)
HDL: 65.3 mg/dL (ref >39.00)
LDL Cholesterol: 86 mg/dL (ref 0–99)
Total CHOL/HDL Ratio: 3
Triglycerides: 103 mg/dL (ref 0.0–149.0)
VLDL: 20.6 mg/dL (ref 0.0–40.0)

## 2014-03-21 LAB — HEPATIC FUNCTION PANEL
ALBUMIN: 3.7 g/dL (ref 3.5–5.2)
ALT: 19 U/L (ref 0–35)
AST: 22 U/L (ref 0–37)
Alkaline Phosphatase: 62 U/L (ref 39–117)
Bilirubin, Direct: 0 mg/dL (ref 0.0–0.3)
TOTAL PROTEIN: 6.3 g/dL (ref 6.0–8.3)
Total Bilirubin: 0.5 mg/dL (ref 0.3–1.2)

## 2014-03-21 LAB — TSH: TSH: 0.6 u[IU]/mL (ref 0.35–5.50)

## 2014-03-21 MED ORDER — CYCLOBENZAPRINE HCL 10 MG PO TABS: 10.0000 mg | ORAL_TABLET | Freq: Three times a day (TID) | ORAL | Status: DC | PRN

## 2014-03-21 NOTE — Telephone Encounter (Signed)
Unable to reach pre visit.  

## 2014-03-21 NOTE — Progress Notes (Signed)
   Subjective:    Patient ID: Sara [name], female    DOB: 1951-02-16, 63 y.o.   MRN: [id number]  HPI CPE- GYN ([facility]) UTD, UTD on colonoscopy.  Plans to schedule mammo next month.  Interested in [place] [facility].   Review of Systems Patient reports no vision/ hearing changes, adenopathy,fever, weight change,  persistant/recurrent hoarseness , swallowing issues, chest pain, palpitations, edema, persistant/recurrent cough, hemoptysis, dyspnea (rest/exertional/paroxysmal nocturnal), gastrointestinal bleeding (melena, rectal bleeding), abdominal pain, significant heartburn, bowel changes, GU symptoms (dysuria, hematuria, incontinence), Gyn symptoms (abnormal  bleeding, pain),  syncope, focal weakness, memory loss, numbness & tingling, skin/hair/nail changes, abnormal bruising or bleeding, anxiety, or depression.     Objective:   Physical Exam General Appearance:    Alert, cooperative, no distress, appears stated age  Head:    Normocephalic, without obvious abnormality, atraumatic  Eyes:    PERRL, conjunctiva/corneas clear, EOM's intact, fundi    benign, both eyes  Ears:    Normal TM's and external ear canals, both ears  Nose:   Nares normal, septum midline, mucosa normal, no drainage    or sinus tenderness  Throat:   Lips, mucosa, and tongue normal; teeth and gums normal  Neck:   Supple, symmetrical, trachea midline, no adenopathy;    Thyroid: no enlargement/tenderness/nodules  Back:     Symmetric, no curvature, ROM normal, no CVA tenderness  Lungs:     Clear to auscultation bilaterally, respirations unlabored  Chest Wall:    No tenderness or deformity   Heart:    Regular rate and rhythm, S1 and S2 normal, no murmur, rub   or gallop  Breast Exam:    Deferred to GYN  Abdomen:     Soft, non-tender, bowel sounds active all four quadrants,    no masses, no organomegaly  Genitalia:    Deferred to GYN  Rectal:    Extremities:   Extremities normal, atraumatic, no cyanosis or edema  Pulses:   2+  and symmetric all extremities  Skin:   Skin color, texture, turgor normal, no rashes or lesions  Lymph nodes:   Cervical, supraclavicular, and axillary nodes normal  Neurologic:   CNII-XII intact, normal strength, sensation and reflexes    throughout          Assessment & Plan:

## 2014-03-21 NOTE — Patient Instructions (Signed)
Follow up in 1 year or as needed We'll notify you of your lab results and make any changes if needed Call and schedule your mammo at your convenience Call with any questions or concerns Keep up the good work!!

## 2014-03-21 NOTE — Progress Notes (Signed)
Pre visit review using our clinic review tool, if applicable. No additional management support is needed unless otherwise documented below in the visit note. 

## 2014-03-21 NOTE — Assessment & Plan Note (Signed)
Pt's PE WNL.  UTD on GYN, due to schedule mammo, UTD on colonoscopy.  Check labs.  Anticipatory guidance provided.

## 2014-03-25 LAB — VITAMIN D 1,25 DIHYDROXY
Vitamin D 1, 25 (OH)2 Total: 63 pg/mL (ref 18–72)
Vitamin D2 1, 25 (OH)2: <8 pg/mL
Vitamin D3 1, 25 (OH)2: 63 pg/mL

## 2014-05-13 ENCOUNTER — Other Ambulatory Visit: Payer: Self-pay

## 2014-05-13 ENCOUNTER — Telehealth: Payer: Self-pay | Admitting: Family Medicine

## 2014-05-13 DIAGNOSIS — Z1231 Encounter for screening mammogram for malignant neoplasm of breast: Secondary | ICD-10-CM

## 2014-05-13 DIAGNOSIS — M62838 Other muscle spasm: Secondary | ICD-10-CM

## 2014-05-13 NOTE — Telephone Encounter (Signed)
Refer to Sara [name] a--[facility]

## 2014-05-13 NOTE — Telephone Encounter (Signed)
Caller name: Raylynn  Call back [phone]   Reason for call:  Pt was seen on 3/6 for muscle spasms.  The patient is still having soreness and issues.  Pt would like a referral to someone or specialist.

## 2014-05-13 NOTE — Telephone Encounter (Signed)
Please advise      KP 

## 2014-05-19 ENCOUNTER — Encounter: Payer: Self-pay | Admitting: Family Medicine

## 2014-05-19 ENCOUNTER — Ambulatory Visit (INDEPENDENT_AMBULATORY_CARE_PROVIDER_SITE_OTHER): Payer: 59 | Admitting: Family Medicine

## 2014-05-19 ENCOUNTER — Ambulatory Visit (INDEPENDENT_AMBULATORY_CARE_PROVIDER_SITE_OTHER)
Admission: RE | Admit: 2014-05-19 | Discharge: 2014-05-19 | Disposition: A | Discharge status: Home / Self Care | Payer: 59 | Source: Ambulatory Visit | Attending: Family Medicine | Admitting: Family Medicine

## 2014-05-19 VITALS — BP 120/80 | HR 76 | Ht 65.25 in | Wt 151.0 lb

## 2014-05-19 DIAGNOSIS — M542 Cervicalgia: Secondary | ICD-10-CM

## 2014-05-19 DIAGNOSIS — M62838 Other muscle spasm: Secondary | ICD-10-CM

## 2014-05-19 DIAGNOSIS — M999 Biomechanical lesion, unspecified: Secondary | ICD-10-CM | POA: Insufficient documentation

## 2014-05-19 DIAGNOSIS — S161XXA Strain of muscle, fascia and tendon at neck level, initial encounter: Secondary | ICD-10-CM | POA: Insufficient documentation

## 2014-05-19 DIAGNOSIS — S139XXA Sprain of joints and ligaments of unspecified parts of neck, initial encounter: Secondary | ICD-10-CM

## 2014-05-19 DIAGNOSIS — M9981 Other biomechanical lesions of cervical region: Secondary | ICD-10-CM

## 2014-05-19 NOTE — Assessment & Plan Note (Signed)
Decision today to treat with OMT was based on Physical Exam  After verbal consent patient was treated with HVLA, ME techniques in cervical thoracic, rib, lumbar and sacral areas  Patient tolerated the procedure well with improvement in symptoms  Patient given exercises, stretches and lifestyle modifications  See medications in patient instructions if given  Patient will follow up in 3-4 weeks

## 2014-05-19 NOTE — Assessment & Plan Note (Signed)
Patient's neck strain is likely secondary to possible osteophytic changes or just the trapezius muscle spasm. Patient did have an elevated first rib and did feel significantly better after manipulation. I would like to get x-rays to make sure everything is normal there is no underlying disease. Patient will do the exercises as discussed above as well as the icing protocol. Patient come back again in 3-4 weeks for further manipulation.

## 2014-05-19 NOTE — Patient Instructions (Addendum)
Very nice to meet you Exercises 3 times a week Physical therapy will be calling you.  Ice 20 minutes at end of long day can help Vitamin D 2000 IU daily Turmeric 500mg  twice daily.  Fish oil 2 grams daily.  No direct air while sleeping and keep elbow below shoulder. Will take time.  Posture exercise on wall.  Heels, butt shoulder and head touching for total of 5 minutes daily.  2 tennis balls duct tape together, lay on them at junction of head and neck until feel release.  Come back in 4 weeks to see how you are doing and likely manipulation.
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
## 2014-05-19 NOTE — Assessment & Plan Note (Signed)
Patient did have a trapezius muscle spasm secondary to neck strain. Patient did respond very well to osteopathic manipulation today. We discussed icing, over-the-counter medications as well as topical medicines he can be beneficial. Patient was given home exercise program as well as postural exercises that I think would be helpful. Patient also wanted to start formal physical therapy which I do think is a good idea. Patient will try these and come back in 3-4 weeks for further evaluation and treatment.

## 2014-05-23 ENCOUNTER — Ambulatory Visit: Payer: 59

## 2014-05-25 ENCOUNTER — Ambulatory Visit: Payer: 59 | Attending: Family Medicine | Admitting: Rehabilitation

## 2014-05-25 DIAGNOSIS — F411 Generalized anxiety disorder: Secondary | ICD-10-CM | POA: Diagnosis not present

## 2014-05-25 DIAGNOSIS — M542 Cervicalgia: Secondary | ICD-10-CM | POA: Diagnosis not present

## 2014-05-25 DIAGNOSIS — IMO0001 Reserved for inherently not codable concepts without codable children: Secondary | ICD-10-CM | POA: Diagnosis present

## 2014-05-25 HISTORY — PX: NECK LIFT: SHX6573

## 2014-05-31 ENCOUNTER — Ambulatory Visit: Payer: 59 | Admitting: Rehabilitation

## 2014-05-31 DIAGNOSIS — IMO0001 Reserved for inherently not codable concepts without codable children: Secondary | ICD-10-CM | POA: Diagnosis not present

## 2014-06-01 ENCOUNTER — Ambulatory Visit: Payer: 59 | Admitting: Rehabilitation

## 2014-06-01 DIAGNOSIS — IMO0001 Reserved for inherently not codable concepts without codable children: Secondary | ICD-10-CM | POA: Diagnosis not present

## 2014-06-07 ENCOUNTER — Ambulatory Visit: Payer: 59 | Admitting: Rehabilitation

## 2014-06-07 DIAGNOSIS — IMO0001 Reserved for inherently not codable concepts without codable children: Secondary | ICD-10-CM | POA: Diagnosis not present

## 2014-06-09 ENCOUNTER — Ambulatory Visit: Payer: 59 | Admitting: Physical Therapy

## 2014-06-09 DIAGNOSIS — IMO0001 Reserved for inherently not codable concepts without codable children: Secondary | ICD-10-CM | POA: Diagnosis not present

## 2014-06-14 ENCOUNTER — Ambulatory Visit: Payer: 59 | Admitting: Rehabilitation

## 2014-06-14 DIAGNOSIS — IMO0001 Reserved for inherently not codable concepts without codable children: Secondary | ICD-10-CM | POA: Diagnosis not present

## 2014-06-16 ENCOUNTER — Ambulatory Visit: Payer: 59 | Admitting: Physical Therapy

## 2014-06-16 DIAGNOSIS — IMO0001 Reserved for inherently not codable concepts without codable children: Secondary | ICD-10-CM | POA: Diagnosis not present

## 2014-06-17 ENCOUNTER — Ambulatory Visit: Payer: 59 | Admitting: Family Medicine

## 2014-06-21 ENCOUNTER — Ambulatory Visit: Payer: 59 | Admitting: Rehabilitation

## 2014-06-21 DIAGNOSIS — IMO0001 Reserved for inherently not codable concepts without codable children: Secondary | ICD-10-CM | POA: Diagnosis not present

## 2014-06-23 ENCOUNTER — Ambulatory Visit: Payer: 59 | Admitting: Physical Therapy

## 2014-06-23 DIAGNOSIS — IMO0001 Reserved for inherently not codable concepts without codable children: Secondary | ICD-10-CM | POA: Diagnosis not present

## 2014-06-24 ENCOUNTER — Ambulatory Visit (INDEPENDENT_AMBULATORY_CARE_PROVIDER_SITE_OTHER): Payer: 59 | Admitting: Family Medicine

## 2014-06-24 ENCOUNTER — Encounter: Payer: Self-pay | Admitting: Family Medicine

## 2014-06-24 VITALS — BP 140/82 | HR 67 | Ht 65.0 in | Wt 154.0 lb

## 2014-06-24 DIAGNOSIS — S161XXD Strain of muscle, fascia and tendon at neck level, subsequent encounter: Secondary | ICD-10-CM

## 2014-06-24 DIAGNOSIS — M9981 Other biomechanical lesions of cervical region: Secondary | ICD-10-CM

## 2014-06-24 DIAGNOSIS — S139XXA Sprain of joints and ligaments of unspecified parts of neck, initial encounter: Secondary | ICD-10-CM

## 2014-06-24 DIAGNOSIS — Z5189 Encounter for other specified aftercare: Secondary | ICD-10-CM

## 2014-06-24 DIAGNOSIS — M999 Biomechanical lesion, unspecified: Secondary | ICD-10-CM
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
## 2014-06-24 NOTE — Assessment & Plan Note (Signed)
Decision today to treat with OMT was based on Physical Exam  After verbal consent patient was treated with HVLA, ME techniques in cervical thoracic, lumbar and sacral areas  Patient tolerated the procedure well with improvement in symptoms  Patient given exercises, stretches and lifestyle modifications  See medications in patient instructions if given  Patient will follow up in 6 weeks

## 2014-06-24 NOTE — Patient Instructions (Signed)
Good to see you  I am glad you are doing better.  Continue the exercises 3 times  A week .  Stop physical therapy.  Come back in 5-6 weeks.   '

## 2014-06-24 NOTE — Assessment & Plan Note (Signed)
Patient is doing significantly better at this time. Patient will continue with conservative therapy with range of motion exercises and strengthening as well as postural exercises. We discussed continuing the over-the-counter medications. Patient is doing so well we'll actually states that to 6 weeks intervals.

## 2014-08-10 ENCOUNTER — Ambulatory Visit: Payer: 59 | Admitting: Family Medicine

## 2014-11-09 ENCOUNTER — Ambulatory Visit
Admission: RE | Admit: 2014-11-09 | Discharge: 2014-11-09 | Disposition: A | Discharge status: Home / Self Care | Payer: 59 | Source: Ambulatory Visit

## 2014-11-09 DIAGNOSIS — Z1231 Encounter for screening mammogram for malignant neoplasm of breast: Secondary | ICD-10-CM

## 2014-11-30 DIAGNOSIS — Z124 Encounter for screening for malignant neoplasm of cervix: Secondary | ICD-10-CM | POA: Diagnosis not present

## 2014-11-30 DIAGNOSIS — Z01419 Encounter for gynecological examination (general) (routine) without abnormal findings: Secondary | ICD-10-CM | POA: Diagnosis not present

## 2014-11-30 DIAGNOSIS — N3281 Overactive bladder: Secondary | ICD-10-CM | POA: Diagnosis not present

## 2014-12-09 ENCOUNTER — Ambulatory Visit (INDEPENDENT_AMBULATORY_CARE_PROVIDER_SITE_OTHER): Payer: Medicare Other | Admitting: Family Medicine

## 2014-12-09 ENCOUNTER — Encounter: Payer: Self-pay | Admitting: Family Medicine

## 2014-12-09 VITALS — BP 124/78 | HR 61 | Temp 98.1°F | Resp 16 | Ht 65.0 in | Wt 158.1 lb

## 2014-12-09 DIAGNOSIS — E663 Overweight: Secondary | ICD-10-CM | POA: Insufficient documentation

## 2014-12-09 DIAGNOSIS — Z0000 Encounter for general adult medical examination without abnormal findings: Secondary | ICD-10-CM

## 2014-12-09 DIAGNOSIS — Z23 Encounter for immunization: Secondary | ICD-10-CM

## 2014-12-09 LAB — LIPID PANEL
Cholesterol: 192 mg/dL (ref 0–200)
HDL: 73.6 mg/dL (ref >39.00)
LDL CALC: 94 mg/dL (ref 0–99)
NonHDL: 118.4
Total CHOL/HDL Ratio: 3
Triglycerides: 124 mg/dL (ref 0.0–149.0)
VLDL: 24.8 mg/dL (ref 0.0–40.0)

## 2014-12-09 LAB — CBC WITH DIFFERENTIAL/PLATELET
Basophils Absolute: 0 10*3/uL (ref 0.0–0.1)
Basophils Relative: 0.5 % (ref 0.0–3.0)
EOS PCT: 2.9 % (ref 0.0–5.0)
Eosinophils Absolute: 0.2 10*3/uL (ref 0.0–0.7)
HCT: 39.2 % (ref 36.0–46.0)
Hemoglobin: 13 g/dL (ref 12.0–15.0)
LYMPHS PCT: 31.5 % (ref 12.0–46.0)
Lymphs Abs: 2.1 10*3/uL (ref 0.7–4.0)
MCHC: 33.1 g/dL (ref 30.0–36.0)
MCV: 92.8 fl (ref 78.0–100.0)
MONO ABS: 0.5 10*3/uL (ref 0.1–1.0)
Monocytes Relative: 7.8 % (ref 3.0–12.0)
NEUTROS ABS: 3.8 10*3/uL (ref 1.4–7.7)
Neutrophils Relative %: 57.3 % (ref 43.0–77.0)
Platelets: 210 10*3/uL (ref 150.0–400.0)
RBC: 4.22 Mil/uL (ref 3.87–5.11)
RDW: 13.1 % (ref 11.5–15.5)
WBC: 6.6 10*3/uL (ref 4.0–10.5)

## 2014-12-09 LAB — BASIC METABOLIC PANEL
BUN: 13 mg/dL (ref 6–23)
CO2: 29 mEq/L (ref 19–32)
CREATININE: 0.64 mg/dL (ref 0.40–1.20)
Calcium: 9.2 mg/dL (ref 8.4–10.5)
Chloride: 106 mEq/L (ref 96–112)
GFR: 98.98 mL/min (ref >60.00)
Glucose, Bld: 85 mg/dL (ref 70–99)
Potassium: 4 mEq/L (ref 3.5–5.1)
Sodium: 140 mEq/L (ref 135–145)

## 2014-12-09 LAB — HEPATIC FUNCTION PANEL
ALBUMIN: 3.9 g/dL (ref 3.5–5.2)
ALT: 17 U/L (ref 0–35)
AST: 19 U/L (ref 0–37)
Alkaline Phosphatase: 75 U/L (ref 39–117)
BILIRUBIN DIRECT: 0.1 mg/dL (ref 0.0–0.3)
Total Bilirubin: 0.4 mg/dL (ref 0.2–1.2)
Total Protein: 6.9 g/dL (ref 6.0–8.3)

## 2014-12-09 NOTE — Progress Notes (Signed)
Pre visit review using our clinic review tool, if applicable. No additional management support is needed unless otherwise documented below in the visit note. 

## 2014-12-09 NOTE — Assessment & Plan Note (Signed)
Pt's PE WNL w/ exception of overweight.  UTD on GYN and colonoscopy.  Written screening schedule updated and given to pt.  Check labs.  Anticipatory guidance provided.

## 2014-12-09 NOTE — Patient Instructions (Signed)
Follow up in 1 year or as needed We'll notify you of your lab results and make any changes if needed Try and make healthy food choices and get regular exercise Call with any questions or concerns Happy [place] Birthday!!

## 2014-12-09 NOTE — Assessment & Plan Note (Signed)
New.  Pt admits to rare exercise and knows 'i need to do better.  Discussed need for regular exercise and healthy food choices.  Check labs to risk stratify.  Will follow.

## 2014-12-09 NOTE — Progress Notes (Signed)
   Subjective:    Patient ID: Sara [name], female    DOB: 1951-10-30, 64 y.o.   MRN: [id number]  HPI Here today for CPE.  Risk Factors: none Physical Activity: limited activity Fall Risk: low Depression: denies current sxs Hearing: normal to conversational tones and whispered voice at 6 ft ADL's: independent Cognitive: normal linear thought process, memory and attention intact Home Safety: safe at home, lives w/ husband Height, Weight, BMI, Visual Acuity: see vitals, vision corrected to 20/20 w/ glasses Counseling: UTD on colonoscopy (Dr Olevia Perches), mammo, pap (GYN- [facility]).  Due for DEXA, Dr Benjie Karvonen plans on doing next year. Health Care POA/Living Will- pt has both, husband is [name], living will at home Labs Ordered: See A&P Care Plan: See A&P    Review of Systems Patient reports no vision/ hearing changes, adenopathy,fever, weight change,  persistant/recurrent hoarseness , swallowing issues, chest pain, palpitations, edema, persistant/recurrent cough, hemoptysis, dyspnea (rest/exertional/paroxysmal nocturnal), gastrointestinal bleeding (melena, rectal bleeding), abdominal pain, significant heartburn, bowel changes, Gyn symptoms (abnormal  bleeding, pain),  syncope, focal weakness, memory loss, numbness & tingling, skin/hair/nail changes, abnormal bruising or bleeding, anxiety, or depression.   + urinary incontinence    Objective:   Physical Exam General Appearance:    Alert, cooperative, no distress, appears stated age  Head:    Normocephalic, without obvious abnormality, atraumatic  Eyes:    PERRL, conjunctiva/corneas clear, EOM's intact, fundi    benign, both eyes  Ears:    Normal TM's and external ear canals, both ears  Nose:   Nares normal, septum midline, mucosa normal, no drainage    or sinus tenderness  Throat:   Lips, mucosa, and tongue normal; teeth and gums normal  Neck:   Supple, symmetrical, trachea midline, no adenopathy;    Thyroid: no  enlargement/tenderness/nodules  Back:     Symmetric, no curvature, ROM normal, no CVA tenderness  Lungs:     Clear to auscultation bilaterally, respirations unlabored  Chest Wall:    No tenderness or deformity   Heart:    Regular rate and rhythm, S1 and S2 normal, no murmur, rub   or gallop  Breast Exam:    Deferred to GYN  Abdomen:     Soft, non-tender, bowel sounds active all four quadrants,    no masses, no organomegaly  Genitalia:    Deferred to GYN  Rectal:    Extremities:   Extremities normal, atraumatic, no cyanosis or edema  Pulses:   2+ and symmetric all extremities  Skin:   Skin color, texture, turgor normal, no rashes or lesions  Lymph nodes:   Cervical, supraclavicular, and axillary nodes normal  Neurologic:   CNII-XII intact, normal strength, sensation and reflexes    throughout          Assessment & Plan:

## 2015-04-17 ENCOUNTER — Encounter: Payer: Self-pay | Admitting: Podiatry

## 2015-04-17 ENCOUNTER — Ambulatory Visit (INDEPENDENT_AMBULATORY_CARE_PROVIDER_SITE_OTHER): Payer: Medicare Other | Admitting: Podiatry

## 2015-04-17 VITALS — BP 140/69 | HR 73 | Ht 64.5 in | Wt 158.0 lb

## 2015-04-17 DIAGNOSIS — M21611 Bunion of right foot: Secondary | ICD-10-CM

## 2015-04-17 DIAGNOSIS — M2042 Other hammer toe(s) (acquired), left foot: Secondary | ICD-10-CM | POA: Diagnosis not present

## 2015-04-17 DIAGNOSIS — M2011 Hallux valgus (acquired), right foot: Secondary | ICD-10-CM | POA: Diagnosis not present

## 2015-04-17 DIAGNOSIS — M21619 Bunion of unspecified foot: Secondary | ICD-10-CM | POA: Insufficient documentation

## 2015-04-17 NOTE — Patient Instructions (Signed)
Seen for enlarged bunion and curling toes. Continue with orthotics. Dispensed metatarsal binder. May benefit from new pair orthotics.

## 2015-04-17 NOTE — Progress Notes (Signed)
Subjective:  64 year old female presents complaining of Rt bunion getting bigger, toes are contracting on left.  Patient currently wearing 65 year old custom orthotics.   Objective:  All pedal pulses are palpable. No skin lesions, normal color and temperature. Hypermobile first right at the first MCJ, high arched foot bil, rearfoot varus bil.  Contracted digit with digital corns 4th and 5th bilateral.  Assessment:  Plantar fasciitis right heel. 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  Plan:  Reviewed all options, shoe, orthotics, injections. Patient wants to try new orthotics. Old ones are too hard to wear. Casted for Orthotics. Need soft with heel cushion, deep cup.

## 2015-05-22 ENCOUNTER — Other Ambulatory Visit: Payer: Self-pay

## 2015-10-04 ENCOUNTER — Encounter: Payer: Self-pay | Admitting: Internal Medicine

## 2015-11-24 ENCOUNTER — Telehealth: Payer: Self-pay | Admitting: Internal Medicine

## 2015-11-24 NOTE — Telephone Encounter (Signed)
Ok w/ me 

## 2015-11-24 NOTE — Telephone Encounter (Signed)
Pt request to transfer from Dr. Birdie Riddle to Dr. Sharlet Salina. Pt stated that she was recommended from Dr. Camila Li and she can not drive all the way to [place]. Please advise.   Phone # [phone]

## 2015-11-28 NOTE — Telephone Encounter (Signed)
That's fine but she would likely have to wait a little while for an appointment given the high load of new patients due to retirements at our office.

## 2015-12-19 ENCOUNTER — Other Ambulatory Visit (INDEPENDENT_AMBULATORY_CARE_PROVIDER_SITE_OTHER): Payer: Medicare Other

## 2015-12-19 ENCOUNTER — Encounter: Payer: Self-pay | Admitting: Internal Medicine

## 2015-12-19 ENCOUNTER — Ambulatory Visit (INDEPENDENT_AMBULATORY_CARE_PROVIDER_SITE_OTHER): Payer: Medicare Other | Admitting: Internal Medicine

## 2015-12-19 VITALS — BP 98/60 | HR 78 | Temp 98.6°F | Resp 12 | Ht 65.0 in | Wt 159.8 lb

## 2015-12-19 DIAGNOSIS — E789 Disorder of lipoprotein metabolism, unspecified: Secondary | ICD-10-CM | POA: Diagnosis not present

## 2015-12-19 DIAGNOSIS — D519 Vitamin B12 deficiency anemia, unspecified: Secondary | ICD-10-CM

## 2015-12-19 DIAGNOSIS — R5383 Other fatigue: Secondary | ICD-10-CM

## 2015-12-19 DIAGNOSIS — Z23 Encounter for immunization: Secondary | ICD-10-CM

## 2015-12-19 DIAGNOSIS — Z0000 Encounter for general adult medical examination without abnormal findings: Secondary | ICD-10-CM

## 2015-12-19 DIAGNOSIS — N3941 Urge incontinence: Secondary | ICD-10-CM

## 2015-12-19 LAB — LIPID PANEL
CHOL/HDL RATIO: 3
CHOLESTEROL: 212 mg/dL — AB (ref 0–200)
HDL: 76.5 mg/dL (ref >39.00)
LDL CALC: 109 mg/dL — AB (ref 0–99)
NonHDL: 135.79
Triglycerides: 133 mg/dL (ref 0.0–149.0)
VLDL: 26.6 mg/dL (ref 0.0–40.0)

## 2015-12-19 LAB — COMPREHENSIVE METABOLIC PANEL
ALT: 18 U/L (ref 0–35)
AST: 19 U/L (ref 0–37)
Albumin: 4.1 g/dL (ref 3.5–5.2)
Alkaline Phosphatase: 72 U/L (ref 39–117)
BUN: 16 mg/dL (ref 6–23)
CHLORIDE: 104 meq/L (ref 96–112)
CO2: 30 meq/L (ref 19–32)
CREATININE: 0.72 mg/dL (ref 0.40–1.20)
Calcium: 9.2 mg/dL (ref 8.4–10.5)
GFR: 86.12 mL/min (ref >60.00)
Glucose, Bld: 94 mg/dL (ref 70–99)
POTASSIUM: 4.7 meq/L (ref 3.5–5.1)
SODIUM: 139 meq/L (ref 135–145)
Total Bilirubin: 0.5 mg/dL (ref 0.2–1.2)
Total Protein: 6.8 g/dL (ref 6.0–8.3)

## 2015-12-19 LAB — CBC
HCT: 41.7 % (ref 36.0–46.0)
Hemoglobin: 13.8 g/dL (ref 12.0–15.0)
MCHC: 33.1 g/dL (ref 30.0–36.0)
MCV: 90.6 fl (ref 78.0–100.0)
Platelets: 237 10*3/uL (ref 150.0–400.0)
RBC: 4.6 Mil/uL (ref 3.87–5.11)
RDW: 13.5 % (ref 11.5–15.5)
WBC: 5.8 10*3/uL (ref 4.0–10.5)

## 2015-12-19 LAB — TSH: TSH: 1.39 u[IU]/mL (ref 0.35–4.50)

## 2015-12-19 LAB — VITAMIN B12: Vitamin B-12: 421 pg/mL (ref 211–911)

## 2015-12-19 MED ORDER — OXYBUTYNIN CHLORIDE 5 MG PO TABS: 5.0000 mg | ORAL_TABLET | Freq: Two times a day (BID) | ORAL | Status: DC

## 2015-12-19 NOTE — Addendum Note (Signed)
Addended by: Resa Miner R on: 12/19/2015 11:55 AM   Modules accepted: Orders

## 2015-12-19 NOTE — Patient Instructions (Signed)
We will check the labs today.   We have sent in oxybutynin which is for the bladder that you can take up to twice a day as needed for the bladder.   Health Maintenance, Female Adopting a healthy lifestyle and getting preventive care can go a long way to promote health and wellness. Talk with your health care provider about what schedule of regular examinations is right for you. This is a good chance for you to check in with your provider about disease prevention and staying healthy. In between checkups, there are plenty of things you can do on your own. Experts have done a lot of research about which lifestyle changes and preventive measures are most likely to keep you healthy. Ask your health care provider for more information. WEIGHT AND DIET  Eat a healthy diet  Be sure to include plenty of vegetables, fruits, low-fat dairy products, and lean protein.  Do not eat a lot of foods high in solid fats, added sugars, or salt.  Get regular exercise. This is one of the most important things you can do for your health.  Most adults should exercise for at least 150 minutes each week. The exercise should increase your heart rate and make you sweat (moderate-intensity exercise).  Most adults should also do strengthening exercises at least twice a week. This is in addition to the moderate-intensity exercise.  Maintain a healthy weight  Body mass index (BMI) is a measurement that can be used to identify possible weight problems. It estimates body fat based on height and weight. Your health care provider can help determine your BMI and help you achieve or maintain a healthy weight.  For females 48 years of age and older:   A BMI below 18.5 is considered underweight.  A BMI of 18.5 to 24.9 is normal.  A BMI of 25 to 29.9 is considered overweight.  A BMI of 30 and above is considered obese.  Watch levels of cholesterol and blood lipids  You should start having your blood tested for lipids and  cholesterol at 66 years of age, then have this test every 5 years.  You may need to have your cholesterol levels checked more often if:  Your lipid or cholesterol levels are high.  You are older than 66 years of age.  You are at high risk for heart disease.  CANCER SCREENING   Lung Cancer  Lung cancer screening is recommended for adults 29-48 years old who are at high risk for lung cancer because of a history of smoking.  A yearly low-dose CT scan of the lungs is recommended for people who:  Currently smoke.  Have quit within the past 15 years.  Have at least a 30-pack-year history of smoking. A pack year is smoking an average of one pack of cigarettes a day for 1 year.  Yearly screening should continue until it has been 15 years since you quit.  Yearly screening should stop if you develop a health problem that would prevent you from having lung cancer treatment.  Breast Cancer  Practice breast self-awareness. This means understanding how your breasts normally appear and feel.  It also means doing regular breast self-exams. Let your health care provider know about any changes, no matter how small.  If you are in your 20s or 30s, you should have a clinical breast exam (CBE) by a health care provider every 1-3 years as part of a regular health exam.  If you are 48 or older, have a  CBE every year. Also consider having a breast X-ray (mammogram) every year.  If you have a family history of breast cancer, talk to your health care provider about genetic screening.  If you are at high risk for breast cancer, talk to your health care provider about having an MRI and a mammogram every year.  Breast cancer gene (BRCA) assessment is recommended for women who have family members with BRCA-related cancers. BRCA-related cancers include:  Breast.  Ovarian.  Tubal.  Peritoneal cancers.  Results of the assessment will determine the need for genetic counseling and BRCA1 and BRCA2  testing. Cervical Cancer Your health care provider may recommend that you be screened regularly for cancer of the pelvic organs (ovaries, uterus, and vagina). This screening involves a pelvic examination, including checking for microscopic changes to the surface of your cervix (Pap test). You may be encouraged to have this screening done every 3 years, beginning at age 37.  For women ages 52-65, health care providers may recommend pelvic exams and Pap testing every 3 years, or they may recommend the Pap and pelvic exam, combined with testing for human papilloma virus (HPV), every 5 years. Some types of HPV increase your risk of cervical cancer. Testing for HPV may also be done on women of any age with unclear Pap test results.  Other health care providers may not recommend any screening for nonpregnant women who are considered low risk for pelvic cancer and who do not have symptoms. Ask your health care provider if a screening pelvic exam is right for you.  If you have had past treatment for cervical cancer or a condition that could lead to cancer, you need Pap tests and screening for cancer for at least 20 years after your treatment. If Pap tests have been discontinued, your risk factors (such as having a new sexual partner) need to be reassessed to determine if screening should resume. Some women have medical problems that increase the chance of getting cervical cancer. In these cases, your health care provider may recommend more frequent screening and Pap tests. Colorectal Cancer  This type of cancer can be detected and often prevented.  Routine colorectal cancer screening usually begins at 66 years of age and continues through 66 years of age.  Your health care provider may recommend screening at an earlier age if you have risk factors for colon cancer.  Your health care provider may also recommend using home test kits to check for hidden blood in the stool.  A small camera at the end of a  tube can be used to examine your colon directly (sigmoidoscopy or colonoscopy). This is done to check for the earliest forms of colorectal cancer.  Routine screening usually begins at age 3.  Direct examination of the colon should be repeated every 5-10 years through 66 years of age. However, you may need to be screened more often if early forms of precancerous polyps or small growths are found. Skin Cancer  Check your skin from head to toe regularly.  Tell your health care provider about any new moles or changes in moles, especially if there is a change in a mole's shape or color.  Also tell your health care provider if you have a mole that is larger than the size of a pencil eraser.  Always use sunscreen. Apply sunscreen liberally and repeatedly throughout the day.  Protect yourself by wearing long sleeves, pants, a wide-brimmed hat, and sunglasses whenever you are outside. HEART DISEASE, DIABETES, AND HIGH BLOOD  PRESSURE   High blood pressure causes heart disease and increases the risk of stroke. High blood pressure is more likely to develop in:  People who have blood pressure in the high end of the normal range (130-139/85-89 mm Hg).  People who are overweight or obese.  People who are African American.  If you are 43-58 years of age, have your blood pressure checked every 3-5 years. If you are 9 years of age or older, have your blood pressure checked every year. You should have your blood pressure measured twice--once when you are at a hospital or clinic, and once when you are not at a hospital or clinic. Record the average of the two measurements. To check your blood pressure when you are not at a hospital or clinic, you can use:  An automated blood pressure machine at a pharmacy.  A home blood pressure monitor.  If you are between 43 years and 25 years old, ask your health care provider if you should take aspirin to prevent strokes.  Have regular diabetes screenings. This  involves taking a blood sample to check your fasting blood sugar level.  If you are at a normal weight and have a low risk for diabetes, have this test once every three years after 66 years of age.  If you are overweight and have a high risk for diabetes, consider being tested at a younger age or more often. PREVENTING INFECTION  Hepatitis B  If you have a higher risk for hepatitis B, you should be screened for this virus. You are considered at high risk for hepatitis B if:  You were born in a country where hepatitis B is common. Ask your health care provider which countries are considered high risk.  Your parents were born in a high-risk country, and you have not been immunized against hepatitis B (hepatitis B vaccine).  You have HIV or AIDS.  You use needles to inject street drugs.  You live with someone who has hepatitis B.  You have had sex with someone who has hepatitis B.  You get hemodialysis treatment.  You take certain medicines for conditions, including cancer, organ transplantation, and autoimmune conditions. Hepatitis C  Blood testing is recommended for:  Everyone born from [birth year] through 1966.  Anyone with known risk factors for hepatitis C. Sexually transmitted infections (STIs)  You should be screened for sexually transmitted infections (STIs) including gonorrhea and chlamydia if:  You are sexually active and are younger than 66 years of age.  You are older than 66 years of age and your health care provider tells you that you are at risk for this type of infection.  Your sexual activity has changed since you were last screened and you are at an increased risk for chlamydia or gonorrhea. Ask your health care provider if you are at risk.  If you do not have HIV, but are at risk, it may be recommended that you take a prescription medicine daily to prevent HIV infection. This is called pre-exposure prophylaxis (PrEP). You are considered at risk if:  You are  sexually active and do not regularly use condoms or know the HIV status of your partner(s).  You take drugs by injection.  You are sexually active with a partner who has HIV. Talk with your health care provider about whether you are at high risk of being infected with HIV. If you choose to begin PrEP, you should first be tested for HIV. You should then be tested every 3  months for as long as you are taking PrEP.  PREGNANCY   If you are premenopausal and you may become pregnant, ask your health care provider about preconception counseling.  If you may become pregnant, take 400 to 800 micrograms (mcg) of folic acid every day.  If you want to prevent pregnancy, talk to your health care provider about birth control (contraception). OSTEOPOROSIS AND MENOPAUSE   Osteoporosis is a disease in which the bones lose minerals and strength with aging. This can result in serious bone fractures. Your risk for osteoporosis can be identified using a bone density scan.  If you are 52 years of age or older, or if you are at risk for osteoporosis and fractures, ask your health care provider if you should be screened.  Ask your health care provider whether you should take a calcium or vitamin D supplement to lower your risk for osteoporosis.  Menopause may have certain physical symptoms and risks.  Hormone replacement therapy may reduce some of these symptoms and risks. Talk to your health care provider about whether hormone replacement therapy is right for you.  HOME CARE INSTRUCTIONS   Schedule regular health, dental, and eye exams.  Stay current with your immunizations.   Do not use any tobacco products including cigarettes, chewing tobacco, or electronic cigarettes.  If you are pregnant, do not drink alcohol.  If you are breastfeeding, limit how much and how often you drink alcohol.  Limit alcohol intake to no more than 1 drink per day for nonpregnant women. One drink equals 12 ounces of beer, 5  ounces of wine, or 1 ounces of hard liquor.  Do not use street drugs.  Do not share needles.  Ask your health care provider for help if you need support or information about quitting drugs.  Tell your health care provider if you often feel depressed.  Tell your health care provider if you have ever been abused or do not feel safe at home.   This information is not intended to replace advice given to you by your health care provider. Make sure you discuss any questions you have with your health care provider.   Document Released: 05/27/2011 Document Revised: 12/02/2014 Document Reviewed: 10/13/2013 Elsevier Interactive Patient Education [employer].

## 2015-12-19 NOTE — Progress Notes (Signed)
   Subjective:    Patient ID: Sara [name], female    DOB: 04/01/1951, 65 y.o.   MRN: [id number]  HPI Here for medicare wellness, no new complaints. Please see A/P for status and treatment of chronic medical problems.   Diet: heart healthy Physical activity: sedentary Depression/mood screen: negative Hearing: intact to whispered voice Visual acuity: grossly normal after cataract surgery, performs annual eye exam  ADLs: capable Fall risk: none Home safety: good Cognitive evaluation: intact to orientation, naming, recall and repetition EOL planning: adv directives discussed  I have personally reviewed and have noted 1. The patient's medical and social history - reviewed today no changes 2. Their use of alcohol, tobacco or illicit drugs 3. Their current medications and supplements 4. The patient's functional ability including ADL's, fall risks, home safety risks and hearing or visual impairment. 5. Diet and physical activities 6. Evidence for depression or mood disorders 7. Care team reviewed and updated (available in snapshot)  Review of Systems  Constitutional: Negative for fever, activity change, appetite change, fatigue and unexpected weight change.  HENT: Negative.   Eyes: Negative.   Respiratory: Negative for cough, chest tightness, shortness of breath and wheezing.   Cardiovascular: Negative for chest pain, palpitations and leg swelling.  Gastrointestinal: Negative for nausea, abdominal pain, diarrhea, constipation and abdominal distention.  Musculoskeletal: Negative.   Skin: Negative.   Neurological: Negative.   Psychiatric/Behavioral: Negative.       Objective:   Physical Exam  Constitutional: She is oriented to person, place, and time. She appears well-developed and well-nourished.  HENT:  Head: Normocephalic and atraumatic.  Eyes: EOM are normal.  Neck: Normal range of motion. No JVD present. No thyromegaly present.  Cardiovascular: Normal rate and regular  rhythm.   No murmur heard. Carotids without bruit  Pulmonary/Chest: Effort normal and breath sounds normal. No respiratory distress. She has no wheezes. She has no rales.  Abdominal: Soft. Bowel sounds are normal. She exhibits no distension. There is no tenderness. There is no rebound.  Musculoskeletal: She exhibits no edema.  Neurological: She is alert and oriented to person, place, and time. Coordination normal.  Skin: Skin is warm and dry.  Psychiatric: She has a normal mood and affect.   Filed Vitals:   12/19/15 1059  BP: 98/60  Pulse: 78  Temp: 98.6 F (37 C)  TempSrc: Oral  Resp: 12  Height: 5\' 5"  (1.651 m)  Weight: 159 lb 12.8 oz (72.485 kg)  SpO2: 98%      Assessment & Plan:  Flu and pneumonia 23 given at visit.

## 2015-12-19 NOTE — Assessment & Plan Note (Signed)
Rx for oxybutynin to use prn for symptoms when needed.

## 2015-12-19 NOTE — Progress Notes (Signed)
Pre visit review using our clinic review tool, if applicable. No additional management support is needed unless otherwise documented below in the visit note. 

## 2015-12-19 NOTE — Assessment & Plan Note (Signed)
Given pneumonia 23 and flu shot at visit to update her immunizations. Colonoscopy and mammogram and dexa up to date. Counseled her on the need for routine exercise. Given 10 year screening recommendations.

## 2015-12-20 LAB — HEPATITIS C ANTIBODY: HCV AB: NEGATIVE

## 2016-04-09 DIAGNOSIS — Z124 Encounter for screening for malignant neoplasm of cervix: Secondary | ICD-10-CM | POA: Diagnosis not present

## 2016-04-10 ENCOUNTER — Other Ambulatory Visit: Payer: Self-pay | Admitting: Obstetrics & Gynecology

## 2016-04-10 DIAGNOSIS — M8580 Other specified disorders of bone density and structure, unspecified site: Secondary | ICD-10-CM

## 2016-04-29 ENCOUNTER — Ambulatory Visit
Admission: RE | Admit: 2016-04-29 | Discharge: 2016-04-29 | Disposition: A | Discharge status: Home / Self Care | Payer: Medicare Other | Source: Ambulatory Visit | Attending: Obstetrics & Gynecology | Admitting: Obstetrics & Gynecology

## 2016-04-29 DIAGNOSIS — M8580 Other specified disorders of bone density and structure, unspecified site: Secondary | ICD-10-CM

## 2016-04-29 DIAGNOSIS — M8589 Other specified disorders of bone density and structure, multiple sites: Secondary | ICD-10-CM | POA: Diagnosis not present

## 2016-04-29 DIAGNOSIS — Z780 Asymptomatic menopausal state: Secondary | ICD-10-CM | POA: Diagnosis not present

## 2016-05-01 DIAGNOSIS — M8580 Other specified disorders of bone density and structure, unspecified site: Secondary | ICD-10-CM | POA: Diagnosis not present

## 2016-06-18 DIAGNOSIS — N815 Vaginal enterocele: Secondary | ICD-10-CM | POA: Diagnosis not present

## 2016-06-18 DIAGNOSIS — N952 Postmenopausal atrophic vaginitis: Secondary | ICD-10-CM | POA: Diagnosis not present

## 2016-07-05 DIAGNOSIS — N815 Vaginal enterocele: Secondary | ICD-10-CM | POA: Diagnosis not present

## 2016-09-20 DIAGNOSIS — Z23 Encounter for immunization: Secondary | ICD-10-CM | POA: Diagnosis not present

## 2016-10-02 ENCOUNTER — Ambulatory Visit (INDEPENDENT_AMBULATORY_CARE_PROVIDER_SITE_OTHER): Payer: Medicare Other | Admitting: Nurse Practitioner

## 2016-10-02 ENCOUNTER — Encounter: Payer: Self-pay | Admitting: Nurse Practitioner

## 2016-10-02 VITALS — BP 126/76 | HR 72 | Ht 64.37 in | Wt 161.2 lb

## 2016-10-02 DIAGNOSIS — R1319 Other dysphagia: Secondary | ICD-10-CM

## 2016-10-02 DIAGNOSIS — K219 Gastro-esophageal reflux disease without esophagitis: Secondary | ICD-10-CM

## 2016-10-02 DIAGNOSIS — R1310 Dysphagia, unspecified: Secondary | ICD-10-CM | POA: Diagnosis not present

## 2016-10-02 MED ORDER — RANITIDINE HCL 150 MG PO TABS: 150.0000 mg | ORAL_TABLET | Freq: Every day | ORAL | 3 refills | Status: DC

## 2016-10-02 NOTE — Progress Notes (Addendum)
HPI: Patient is a 65 year old female formerly followed by Dr. Olevia Perches. She is referred by PCP Dr. Pricilla Holm for evaluation of dysphagia.  She underwent EGD with savary dilation in June 2014. 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 Barium swallow showed she had a moderate size hiatal hernia, with both paraesophageal and sliding components. Referred to Surgery and underwent hernia repair and Nissen fundoplication Nov [id number] by Dr. Hassell Done. Sara [name] did well from a GI standpoint until just recently when she developed burning in her throat and cold sensation in her chest as well as intermittent solid food dysphagia. She reimplemented anti-reflux measures, took Tums, started taking  "Super Digestive Enzymes" and is doing much better. Asymptomatic over last 3 weeks.   Past Medical History:  Diagnosis Date  . ANXIETY   . Esophageal stricture   . PLANTAR FASCIITIS, BILATERAL   . PONV (postoperative nausea and vomiting)   . SUPRAVENTRICULAR TACHYCARDIA   . Urge incontinence      Past Surgical History:  Procedure Laterality Date  . Adenosine Myoview  03/14/05  . Cather ablation  04/2007  . EYE SURGERY Bilateral 10-2010   cataracts with lens replacments  . HERNIA REPAIR  15 yrs ago  . VAGINAL HYSTERECTOMY  yrs ago   Family History  Problem Relation Age of Onset  . Arthritis Mother   . Hypertension Mother   . Coronary artery disease Father   . Heart disease Father   . Breast cancer Maternal Grandmother   . Stroke Paternal Grandmother   . Coronary artery disease Paternal Grandfather   . Stomach cancer Neg Hx   . Colon cancer Neg Hx    Social History  Substance Use Topics  . Smoking status: Never Smoker  . Smokeless tobacco: Never Used  . Alcohol use 0.0 oz/week     Comment: red wine daily    Current Outpatient Prescriptions  Medication Sig Dispense Refill  . b complex vitamins capsule Take 1  capsule by mouth daily.    . Biotin 1000 MCG tablet Take 1,000 mcg by mouth 3 (three) times daily.    . Cholecalciferol (VITAMIN D) 2000 units CAPS Take by mouth.    . conjugated estrogens (PREMARIN) vaginal cream Place 1 Applicatorful vaginally once a week.    . Multiple Vitamins-Minerals (MULTIVITAMIN PO) Take 1 tablet by mouth daily.    Marland Kitchen oxybutynin (DITROPAN) 5 MG tablet Take 1 tablet (5 mg total) by mouth 2 (two) times daily. 60 tablet 3  . vitamin C (ASCORBIC ACID) 500 MG tablet Take 500 mg by mouth daily.     No current facility-administered medications for this visit.    Allergies  Allergen Reactions  . Tramadol Nausea And Vomiting    Dizzy also     Review of Systems: All systems reviewed and negative except where noted in HPI.    Physical Exam: BP 126/76   Pulse 72   Ht 5' 4.37" (1.635 m) Comment: w/o shoes  Wt 161 lb 4 oz (73.1 kg)   BMI 27.36 kg/m  Constitutional:  Well-developed, white female in no acute distress. Here with husband Psychiatric: Normal mood and affect. Behavior is normal. HEENT: Normocephalic and atraumatic. Conjunctivae are normal. No scleral icterus. Neck supple.  Cardiovascular: Normal rate, regular rhythm.  Pulmonary/chest: Effort normal and breath sounds normal. No wheezing, rales or rhonchi. Abdominal: Soft, nondistended, nontender. Bowel sounds active throughout. There are no masses palpable. No  hepatomegaly. Extremities: no edema Lymphadenopathy: No cervical adenopathy noted. Neurological: Alert and oriented to person place and time. Skin: Skin is warm and dry. No rashes noted.   ASSESSMENT AND PLAN:  Recurrent GERD symptoms / intermittent solid food dysphagia in a 65 year old female who is s/p hiatal hernia repair / Nissen fundoplication in [id number]. She also has a known history of a tortuous esophagus with a mild benign stricture dilated prior to above surgery.    -Her GERD symptoms and dysphagia are better over last 3 weeks with  lifestyle modifications and addition of digestive enzymes. -Ok to take a Zantac as needed in evening when symptoms have bothered her the most. . If having frequent symptoms or recurrent dysphagia then needs evaluation and will probably start with barium swallow to evaluate anatomy.   -follow up with Dr. Hilarie Fredrickson who patient would like to establish care since Dr. Olevia Perches has retired. Patient's husband is followed by Dr. Hilarie Fredrickson as well.   Sara [name]  10/02/2016, 11:23 AM  Cc:  Pricilla Holm, MD  Addendum: Reviewed and agree with initial management. Jerene Bears, MD

## 2016-10-02 NOTE — Patient Instructions (Addendum)
Zantac 150 mg every night as needed for heartburn.   Please call us if you are having recurrent swallowing problems.    Gastroesophageal Reflux Disease, Adult Normally, food travels down the esophagus and stays in the stomach to be digested. However, when a person has gastroesophageal reflux disease (GERD), food and stomach acid move back up into the esophagus. When this happens, the esophagus becomes sore and inflamed. Over time, GERD can create small holes (ulcers) in the lining of the esophagus.  CAUSES This condition is caused by a problem with the muscle between the esophagus and the stomach (lower esophageal sphincter, or LES). Normally, the LES muscle closes after food passes through the esophagus to the stomach. When the LES is weakened or abnormal, it does not close properly, and that allows food and stomach acid to go back up into the esophagus. The LES can be weakened by certain dietary substances, medicines, and medical conditions, including:  Tobacco use.  Pregnancy.  Having a hiatal hernia.  Heavy alcohol use.  Certain foods and beverages, such as coffee, chocolate, onions, and peppermint. RISK FACTORS This condition is more likely to develop in:  People who have an increased body weight.  People who have connective tissue disorders.  People who use NSAID medicines. SYMPTOMS Symptoms of this condition include:  Heartburn.  Difficult or painful swallowing.  The feeling of having a lump in the throat.  Abitter taste in the mouth.  Bad breath.  Having a large amount of saliva.  Having an upset or bloated stomach.  Belching.  Chest pain.  Shortness of breath or wheezing.  Ongoing (chronic) cough or a night-time cough.  Wearing away of tooth enamel.  Weight loss. Different conditions can cause chest pain. Make sure to see your health care provider if you experience chest pain. DIAGNOSIS Your health care provider will take a medical history and  perform a physical exam. To determine if you have mild or severe GERD, your health care provider may also monitor how you respond to treatment. You may also have other tests, including:  An endoscopy toexamine your stomach and esophagus with a small camera.  A test thatmeasures the acidity level in your esophagus.  A test thatmeasures how much pressure is on your esophagus.  A barium swallow or modified barium swallow to show the shape, size, and functioning of your esophagus. TREATMENT The goal of treatment is to help relieve your symptoms and to prevent complications. Treatment for this condition may vary depending on how severe your symptoms are. Your health care provider may recommend:  Changes to your diet.  Medicine.  Surgery. HOME CARE INSTRUCTIONS Diet  Follow a diet as recommended by your health care provider. This may involve avoiding foods and drinks such as:  Coffee and tea (with or without caffeine).  Drinks that containalcohol.  Energy drinks and sports drinks.  Carbonated drinks or sodas.  Chocolate and cocoa.  Peppermint and mint flavorings.  Garlic and onions.  Horseradish.  Spicy and acidic foods, including peppers, chili powder, curry powder, vinegar, hot sauces, and barbecue sauce.  Citrus fruit juices and citrus fruits, such as oranges, lemons, and limes.  Tomato-based foods, such as red sauce, chili, salsa, and pizza with red sauce.  Fried and fatty foods, such as donuts, french fries, potato chips, and high-fat dressings.  High-fat meats, such as hot dogs and fatty cuts of red and white meats, such as rib eye steak, sausage, ham, and bacon.  High-fat dairy items, such as  whole milk, butter, and cream cheese.  Eat small, frequent meals instead of large meals.  Avoid drinking large amounts of liquid with your meals.  Avoid eating meals during the 2-3 hours before bedtime.  Avoid lying down right after you eat.  Do not exercise right  after you eat. General Instructions  Pay attention to any changes in your symptoms.  Take over-the-counter and prescription medicines only as told by your health care provider. Do not take aspirin, ibuprofen, or other NSAIDs unless your health care provider told you to do so.  Do not use any tobacco products, including cigarettes, chewing tobacco, and e-cigarettes. If you need help quitting, ask your health care provider.  Wear loose-fitting clothing. Do not wear anything tight around your waist that causes pressure on your abdomen.  Raise (elevate) the head of your bed 6 inches (15cm).  Try to reduce your stress, such as with yoga or meditation. If you need help reducing stress, ask your health care provider.  If you are overweight, reduce your weight to an amount that is healthy for you. Ask your health care provider for guidance about a safe weight loss goal.  Keep all follow-up visits as told by your health care provider. This is important. SEEK MEDICAL CARE IF:  You have new symptoms.  You have unexplained weight loss.  You have difficulty swallowing, or it hurts to swallow.  You have wheezing or a persistent cough.  Your symptoms do not improve with treatment.  You have a hoarse voice. SEEK IMMEDIATE MEDICAL CARE IF:  You have pain in your arms, neck, jaw, teeth, or back.  You feel sweaty, dizzy, or light-headed.  You have chest pain or shortness of breath.  You vomit and your vomit looks like blood or coffee grounds.  You faint.  Your stool is bloody or black.  You cannot swallow, drink, or eat.   This information is not intended to replace advice given to you by your health care provider. Make sure you discuss any questions you have with your health care provider.   Document Released: 08/21/2005 Document Revised: 08/02/2015 Document Reviewed: 03/08/2015 Elsevier Interactive Patient Education [employer].

## 2016-10-29 DIAGNOSIS — N815 Vaginal enterocele: Secondary | ICD-10-CM | POA: Diagnosis not present

## 2016-10-29 DIAGNOSIS — N952 Postmenopausal atrophic vaginitis: Secondary | ICD-10-CM | POA: Diagnosis not present

## 2016-11-01 ENCOUNTER — Ambulatory Visit: Payer: Medicare Other | Admitting: Gastroenterology

## 2016-11-20 DIAGNOSIS — D485 Neoplasm of uncertain behavior of skin: Secondary | ICD-10-CM | POA: Diagnosis not present

## 2016-11-20 DIAGNOSIS — L281 Prurigo nodularis: Secondary | ICD-10-CM | POA: Diagnosis not present

## 2016-11-20 DIAGNOSIS — L820 Inflamed seborrheic keratosis: Secondary | ICD-10-CM | POA: Diagnosis not present

## 2016-11-20 DIAGNOSIS — Z23 Encounter for immunization: Secondary | ICD-10-CM | POA: Diagnosis not present

## 2016-11-22 DIAGNOSIS — D235 Other benign neoplasm of skin of trunk: Secondary | ICD-10-CM | POA: Diagnosis not present

## 2016-12-24 ENCOUNTER — Ambulatory Visit: Payer: Medicare Other | Admitting: Internal Medicine

## 2016-12-26 ENCOUNTER — Ambulatory Visit: Payer: Medicare Other

## 2016-12-30 ENCOUNTER — Ambulatory Visit (INDEPENDENT_AMBULATORY_CARE_PROVIDER_SITE_OTHER): Payer: Medicare Other | Admitting: *Deleted

## 2016-12-30 VITALS — BP 128/78 | HR 97 | Ht 64.0 in | Wt 162.0 lb

## 2016-12-30 DIAGNOSIS — Z0000 Encounter for general adult medical examination without abnormal findings: Secondary | ICD-10-CM | POA: Diagnosis not present

## 2016-12-30 NOTE — Addendum Note (Signed)
Addended by: Emelia Loron A on: 12/30/2016 05:36 PM   Modules accepted: Orders

## 2016-12-30 NOTE — Progress Notes (Addendum)
Subjective:   Sara [name] is a 66 y.o. female who presents for Medicare Annual (Subsequent) preventive examination.  Review of Systems:  No ROS.  Medicare Wellness Visit.    Sleep patterns: no sleep issues, feels rested on waking, gets up 1 times nightly to void and sleeps 8 hours nightly.   Home Safety/Smoke Alarms: Feels safe in home. Smoke alarms in place. C02 protectors    Living environment; residence and Firearm Safety: 2-story house, firearms stored safely . Lives with husband Seat Belt Safety/Bike Helmet: Wears seat belt.   Counseling:   Eye Exam-  Last 03/2016,  Dr. Sherlean Foot  Dental- Every 6 months, has invisaline braces Dr. Caryn Section  Female:   Pap-  N/A Hysterectomy    Mammo-   Last 11/09/14 Patient was sent a reminder card and states she will make an appointment Dexa scan- Last 04/29/16 , osteoporosis   CCS- 10/17/2008, normal recall 10 years      Objective:     Vitals: BP 128/78   Pulse 97   Ht 5\' 4"  (1.626 m)   Wt 162 lb (73.5 kg)   SpO2 98%   BMI 27.81 kg/m   Body mass index is 27.81 kg/m.   Tobacco History  Smoking Status  . Never Smoker  Smokeless Tobacco  . Never Used     Counseling given: Not Answered   Past Medical History:  Diagnosis Date  . ANXIETY   . Esophageal stricture   . PLANTAR FASCIITIS, BILATERAL   . PONV (postoperative nausea and vomiting)   . SUPRAVENTRICULAR TACHYCARDIA   . Urge incontinence    Past Surgical History:  Procedure Laterality Date  . Adenosine Myoview  03/14/05  . Cather ablation  04/2007  . EYE SURGERY Bilateral 10-2010   cataracts with lens replacments  . HERNIA REPAIR  15 yrs ago  . NECK LIFT  05/2014   plastic  surgery for a neck lift  . VAGINAL HYSTERECTOMY  yrs ago   Family History  Problem Relation Age of Onset  . Arthritis Mother   . Hypertension Mother   . Coronary artery disease Father   . Heart disease Father   . Breast cancer Maternal Grandmother   . Stroke Paternal Grandmother   .  Coronary artery disease Paternal Grandfather   . Stomach cancer Neg Hx   . Colon cancer Neg Hx    History  Sexual Activity  . Sexual activity: Not Currently  . Partners: Male    Outpatient Encounter Prescriptions as of 12/30/2016  Medication Sig  . b complex vitamins capsule Take 1 capsule by mouth daily.  . Biotin 1000 MCG tablet Take 1,000 mcg by mouth daily.   . Cholecalciferol (VITAMIN D) 2000 units CAPS Take by mouth.  . conjugated estrogens (PREMARIN) vaginal cream Place 1 Applicatorful vaginally once a week.  . Multiple Vitamins-Minerals (MULTIVITAMIN PO) Take 1 tablet by mouth daily.  . vitamin C (ASCORBIC ACID) 500 MG tablet Take 500 mg by mouth daily.  . [DISCONTINUED] oxybutynin (DITROPAN) 5 MG tablet Take 1 tablet (5 mg total) by mouth 2 (two) times daily. (Patient not taking: Reported on 12/30/2016)  . [DISCONTINUED] ranitidine (ZANTAC) 150 MG tablet Take 1 tablet (150 mg total) by mouth at bedtime. (Patient not taking: Reported on 12/30/2016)   No facility-administered encounter medications on file as of 12/30/2016.     Activities of Daily Living In your present state of health, do you have any difficulty performing the following activities: 12/30/2016  Hearing?  N  Vision? N  Difficulty concentrating or making decisions? N  Walking or climbing stairs? N  Dressing or bathing? N  Doing errands, shopping? N  Preparing Food and eating ? N  Using the Toilet? N  In the past six months, have you accidently leaked urine? Y  Do you have problems with loss of bowel control? N  Managing your Medications? N  Managing your Finances? N  Housekeeping or managing your Housekeeping? N  Some recent data might be hidden    Patient Care Team: Hoyt Koch, MD as PCP - General (Internal Medicine) Azucena Fallen, MD as Consulting Physician (Obstetrics and Gynecology)    Assessment:    Physical assessment deferred to PCP.  Exercise Activities and Dietary recommendations Current  Exercise Habits: The patient does not participate in regular exercise at present, Type of exercise: walking (fit bit walks 5000 steps per day), Intensity: Mild Diet (meal preparation, eat out, water intake, caffeinated beverages, dairy products, fruits and vegetables): in general, a "healthy" diet   hot tea or coffee x1 cup in morning, 4 cups of water per day Breakfast: oatmeal with bran and fruit Lunch: vegetable and meat Dinner:  Chicken, peas other vegetables  Encouraged patient to increase water intake. Goals    . Exercise 3x per week (30 min per time)          Increase my steps to 7000      Fall Risk Fall Risk  12/30/2016 12/19/2015 12/09/2014 03/21/2014  Falls in the past year? No No No No   Depression Screen PHQ 2/9 Scores 12/30/2016 12/19/2015 12/09/2014 03/21/2014  PHQ - 2 Score 0 0 0 0     Cognitive Function MMSE - Mini Mental State Exam 12/30/2016  Orientation to time 5  Orientation to Place 5  Registration 3  Attention/ Calculation 5  Recall 3  Language- name 2 objects 2  Language- repeat 1  Language- follow 3 step command 3  Language- read & follow direction 1  Write a sentence 1  Copy design 1  Total score 30       Ad8 score reviewed for issues:  Issues making decisions: no   Less interest in hobbies / activities:no  Repeats questions, stories (family complaining): no  Trouble using ordinary gadgets (microwave, computer, phone): no  Forgets the month or year: no  Mismanaging finances: no  Remembering appts: no  Daily problems with thinking and/or memory: no Ad8 score is= 0     Immunization History  Administered Date(s) Administered  . Influenza Split 11/13/2011  . Influenza Whole 09/06/2009, 08/25/2012  . Influenza,inj,Quad PF,36+ Mos 09/16/2013, 12/09/2014, 12/19/2015  . Pneumococcal Conjugate-13 03/21/2014  . Pneumococcal Polysaccharide-23 12/10/1999, 12/19/2015  . Td 11/25/1998, 09/06/2009  . Zoster 01/23/2010   Screening Tests Health  Maintenance  Topic Date Due  . INFLUENZA VACCINE  06/25/2016  . MAMMOGRAM  11/09/2016  . COLONOSCOPY  10/17/2018  . TETANUS/TDAP  09/07/2019  . DEXA SCAN  Completed  . ZOSTAVAX  Completed  . Hepatitis C Screening  Completed  . PNA vac Low Risk Adult  Completed      Plan:     Continue to eat heart healthy diet (full of fruits, vegetables, whole grains, lean protein, water--limit salt, fat, and sugar intake) and increase physical activity as tolerated.  Continue doing brain stimulating activities (puzzles, reading, adult coloring books, staying active) to keep memory sharp.   During the course of the visit the patient was educated and counseled about the following  appropriate screening and preventive services:   Vaccines to include Pneumoccal, Influenza, Hepatitis B, Td, Zostavax, HCV  Cardiovascular Disease  Colorectal cancer screening  Bone density screening  Diabetes screening  Glaucoma screening  Mammography/PAP  Nutrition counseling   Patient Instructions (the written plan) was given to the patient.   Michiel Cowboy, RN  12/30/2016

## 2016-12-30 NOTE — Patient Instructions (Addendum)
Continue to eat heart healthy diet (full of fruits, vegetables, whole grains, lean protein, water--limit salt, fat, and sugar intake) and increase physical activity as tolerated.  Continue doing brain stimulating activities (puzzles, reading, adult coloring books, staying active) to keep memory sharp.   Health Maintenance, Female Introduction Adopting a healthy lifestyle and getting preventive care can go a long way to promote health and wellness. Talk with your health care provider about what schedule of regular examinations is right for you. This is a good chance for you to check in with your provider about disease prevention and staying healthy. In between checkups, there are plenty of things you can do on your own. Experts have done a lot of research about which lifestyle changes and preventive measures are most likely to keep you healthy. Ask your health care provider for more information. Weight and diet Eat a healthy diet  Be sure to include plenty of vegetables, fruits, low-fat dairy products, and lean protein.  Do not eat a lot of foods high in solid fats, added sugars, or salt.  Get regular exercise. This is one of the most important things you can do for your health.  Most adults should exercise for at least 150 minutes each week. The exercise should increase your heart rate and make you sweat (moderate-intensity exercise).  Most adults should also do strengthening exercises at least twice a week. This is in addition to the moderate-intensity exercise. Maintain a healthy weight  Body mass index (BMI) is a measurement that can be used to identify possible weight problems. It estimates body fat based on height and weight. Your health care provider can help determine your BMI and help you achieve or maintain a healthy weight.  For females 67 years of age and older:  A BMI below 18.5 is considered underweight.  A BMI of 18.5 to 24.9 is normal.  A BMI of 25 to 29.9 is considered  overweight.  A BMI of 30 and above is considered obese. Watch levels of cholesterol and blood lipids  You should start having your blood tested for lipids and cholesterol at 67 years of age, then have this test every 5 years.  You may need to have your cholesterol levels checked more often if:  Your lipid or cholesterol levels are high.  You are older than 67 years of age.  You are at high risk for heart disease. Cancer screening Lung Cancer  Lung cancer screening is recommended for adults 67-31 years old who are at high risk for lung cancer because of a history of smoking.  A yearly low-dose CT scan of the lungs is recommended for people who:  Currently smoke.  Have quit within the past 15 years.  Have at least a 30-pack-year history of smoking. A pack year is smoking an average of one pack of cigarettes a day for 1 year.  Yearly screening should continue until it has been 15 years since you quit.  Yearly screening should stop if you develop a health problem that would prevent you from having lung cancer treatment. Breast Cancer  Practice breast self-awareness. This means understanding how your breasts normally appear and feel.  It also means doing regular breast self-exams. Let your health care provider know about any changes, no matter how small.  If you are in your 20s or 30s, you should have a clinical breast exam (CBE) by a health care provider every 1-3 years as part of a regular health exam.  If you are 67  or older, have a CBE every year. Also consider having a breast X-ray (mammogram) every year.  If you have a family history of breast cancer, talk to your health care provider about genetic screening.  If you are at high risk for breast cancer, talk to your health care provider about having an MRI and a mammogram every year.  Breast cancer gene (BRCA) assessment is recommended for women who have family members with BRCA-related cancers. BRCA-related cancers  include:  Breast.  Ovarian.  Tubal.  Peritoneal cancers.  Results of the assessment will determine the need for genetic counseling and BRCA1 and BRCA2 testing. Cervical Cancer  Your health care provider may recommend that you be screened regularly for cancer of the pelvic organs (ovaries, uterus, and vagina). This screening involves a pelvic examination, including checking for microscopic changes to the surface of your cervix (Pap test). You may be encouraged to have this screening done every 3 years, beginning at age 67.  For women ages 67-65, health care providers may recommend pelvic exams and Pap testing every 3 years, or they may recommend the Pap and pelvic exam, combined with testing for human papilloma virus (HPV), every 5 years. Some types of HPV increase your risk of cervical cancer. Testing for HPV may also be done on women of any age with unclear Pap test results.  Other health care providers may not recommend any screening for nonpregnant women who are considered low risk for pelvic cancer and who do not have symptoms. Ask your health care provider if a screening pelvic exam is right for you.  If you have had past treatment for cervical cancer or a condition that could lead to cancer, you need Pap tests and screening for cancer for at least 20 years after your treatment. If Pap tests have been discontinued, your risk factors (such as having a new sexual partner) need to be reassessed to determine if screening should resume. Some women have medical problems that increase the chance of getting cervical cancer. In these cases, your health care provider may recommend more frequent screening and Pap tests. Colorectal Cancer  This type of cancer can be detected and often prevented.  Routine colorectal cancer screening usually begins at 67 years of age and continues through 67 years of age.  Your health care provider may recommend screening at an earlier age if you have risk factors  for colon cancer.  Your health care provider may also recommend using home test kits to check for hidden blood in the stool.  A small camera at the end of a tube can be used to examine your colon directly (sigmoidoscopy or colonoscopy). This is done to check for the earliest forms of colorectal cancer.  Routine screening usually begins at age 27.  Direct examination of the colon should be repeated every 5-10 years through 66 years of age. However, you may need to be screened more often if early forms of precancerous polyps or small growths are found. Skin Cancer  Check your skin from head to toe regularly.  Tell your health care provider about any new moles or changes in moles, especially if there is a change in a mole's shape or color.  Also tell your health care provider if you have a mole that is larger than the size of a pencil eraser.  Always use sunscreen. Apply sunscreen liberally and repeatedly throughout the day.  Protect yourself by wearing long sleeves, pants, a wide-brimmed hat, and sunglasses whenever you are outside. Heart  disease, diabetes, and high blood pressure  High blood pressure causes heart disease and increases the risk of stroke. High blood pressure is more likely to develop in:  People who have blood pressure in the high end of the normal range (130-139/85-89 mm Hg).  People who are overweight or obese.  People who are African American.  If you are 35-89 years of age, have your blood pressure checked every 3-5 years. If you are 62 years of age or older, have your blood pressure checked every year. You should have your blood pressure measured twice-once when you are at a hospital or clinic, and once when you are not at a hospital or clinic. Record the average of the two measurements. To check your blood pressure when you are not at a hospital or clinic, you can use:  An automated blood pressure machine at a pharmacy.  A home blood pressure monitor.  If you  are between 71 years and 61 years old, ask your health care provider if you should take aspirin to prevent strokes.  Have regular diabetes screenings. This involves taking a blood sample to check your fasting blood sugar level.  If you are at a normal weight and have a low risk for diabetes, have this test once every three years after 67 years of age.  If you are overweight and have a high risk for diabetes, consider being tested at a younger age or more often. Preventing infection Hepatitis B  If you have a higher risk for hepatitis B, you should be screened for this virus. You are considered at high risk for hepatitis B if:  You were born in a country where hepatitis B is common. Ask your health care provider which countries are considered high risk.  Your parents were born in a high-risk country, and you have not been immunized against hepatitis B (hepatitis B vaccine).  You have HIV or AIDS.  You use needles to inject street drugs.  You live with someone who has hepatitis B.  You have had sex with someone who has hepatitis B.  You get hemodialysis treatment.  You take certain medicines for conditions, including cancer, organ transplantation, and autoimmune conditions. Hepatitis C  Blood testing is recommended for:  Everyone born from [birth year] through 1966.  Anyone with known risk factors for hepatitis C. Sexually transmitted infections (STIs)  You should be screened for sexually transmitted infections (STIs) including gonorrhea and chlamydia if:  You are sexually active and are younger than 67 years of age.  You are older than 67 years of age and your health care provider tells you that you are at risk for this type of infection.  Your sexual activity has changed since you were last screened and you are at an increased risk for chlamydia or gonorrhea. Ask your health care provider if you are at risk.  If you do not have HIV, but are at risk, it may be recommended that you  take a prescription medicine daily to prevent HIV infection. This is called pre-exposure prophylaxis (PrEP). You are considered at risk if:  You are sexually active and do not regularly use condoms or know the HIV status of your partner(s).  You take drugs by injection.  You are sexually active with a partner who has HIV. Talk with your health care provider about whether you are at high risk of being infected with HIV. If you choose to begin PrEP, you should first be tested for HIV. You should then be  tested every 3 months for as long as you are taking PrEP. Pregnancy  If you are premenopausal and you may become pregnant, ask your health care provider about preconception counseling.  If you may become pregnant, take 400 to 800 micrograms (mcg) of folic acid every day.  If you want to prevent pregnancy, talk to your health care provider about birth control (contraception). Osteoporosis and menopause  Osteoporosis is a disease in which the bones lose minerals and strength with aging. This can result in serious bone fractures. Your risk for osteoporosis can be identified using a bone density scan.  If you are 3 years of age or older, or if you are at risk for osteoporosis and fractures, ask your health care provider if you should be screened.  Ask your health care provider whether you should take a calcium or vitamin D supplement to lower your risk for osteoporosis.  Menopause may have certain physical symptoms and risks.  Hormone replacement therapy may reduce some of these symptoms and risks. Talk to your health care provider about whether hormone replacement therapy is right for you. Follow these instructions at home:  Schedule regular health, dental, and eye exams.  Stay current with your immunizations.  Do not use any tobacco products including cigarettes, chewing tobacco, or electronic cigarettes.  If you are pregnant, do not drink alcohol.  If you are breastfeeding, limit  how much and how often you drink alcohol.  Limit alcohol intake to no more than 1 drink per day for nonpregnant women. One drink equals 12 ounces of beer, 5 ounces of Jarelis Ehlert, or 1 ounces of hard liquor.  Do not use street drugs.  Do not share needles.  Ask your health care provider for help if you need support or information about quitting drugs.  Tell your health care provider if you often feel depressed.  Tell your health care provider if you have ever been abused or do not feel safe at home. This information is not intended to replace advice given to you by your health care provider. Make sure you discuss any questions you have with your health care provider. Document Released: 05/27/2011 Document Revised: 04/18/2016 Document Reviewed: 08/15/2015  2017 Elsevier

## 2016-12-30 NOTE — Progress Notes (Signed)
Pre visit review using our clinic review tool, if applicable. No additional management support is needed unless otherwise documented below in the visit note. 

## 2016-12-31 NOTE — Progress Notes (Signed)
Medical screening examination/treatment/procedure(s) were performed by non-physician practitioner and as supervising physician I was immediately available for consultation/collaboration. I agree with above. Oswaldo Cueto A Gabriela Irigoyen, MD 

## 2017-01-01 ENCOUNTER — Telehealth: Payer: Self-pay | Admitting: *Deleted

## 2017-01-01 ENCOUNTER — Other Ambulatory Visit: Payer: Self-pay | Admitting: Internal Medicine

## 2017-01-01 MED ORDER — OXYBUTYNIN CHLORIDE 5 MG PO TABS: 5.0000 mg | ORAL_TABLET | Freq: Two times a day (BID) | ORAL | 1 refills | Status: DC

## 2017-01-01 NOTE — Telephone Encounter (Signed)
Notified pt rx sent to pharmacy../[facility] 

## 2017-01-01 NOTE — Telephone Encounter (Signed)
It would appear this was removed from the list by the health coach at her wellness visit because she was not taking. Refill sent in.

## 2017-01-01 NOTE — Telephone Encounter (Signed)
Pt left msg on triage states she is leaving going out of town and needing to get refill on the Oxybutynin she takes prn for  bladder spasm. Refill was denied and wanting to know why. She states MD rx med back in the summer of last year...Johny Chess

## 2017-02-26 ENCOUNTER — Other Ambulatory Visit: Payer: Self-pay | Admitting: Nurse Practitioner

## 2017-03-04 DIAGNOSIS — N815 Vaginal enterocele: Secondary | ICD-10-CM | POA: Diagnosis not present

## 2017-04-16 DIAGNOSIS — Z1231 Encounter for screening mammogram for malignant neoplasm of breast: Secondary | ICD-10-CM | POA: Diagnosis not present

## 2017-05-16 ENCOUNTER — Other Ambulatory Visit: Payer: Self-pay | Admitting: Internal Medicine

## 2017-06-24 DIAGNOSIS — N3281 Overactive bladder: Secondary | ICD-10-CM | POA: Diagnosis not present

## 2017-06-24 DIAGNOSIS — N815 Vaginal enterocele: Secondary | ICD-10-CM | POA: Diagnosis not present

## 2017-08-25 DIAGNOSIS — Z23 Encounter for immunization: Secondary | ICD-10-CM | POA: Diagnosis not present

## 2017-09-09 ENCOUNTER — Encounter: Payer: Self-pay | Admitting: Internal Medicine

## 2017-09-09 ENCOUNTER — Ambulatory Visit (INDEPENDENT_AMBULATORY_CARE_PROVIDER_SITE_OTHER): Payer: Medicare Other | Admitting: Internal Medicine

## 2017-09-09 ENCOUNTER — Other Ambulatory Visit (INDEPENDENT_AMBULATORY_CARE_PROVIDER_SITE_OTHER): Payer: Medicare Other

## 2017-09-09 VITALS — BP 110/76 | HR 68 | Temp 98.2°F | Ht 64.0 in | Wt 163.0 lb

## 2017-09-09 DIAGNOSIS — T148XXA Other injury of unspecified body region, initial encounter: Secondary | ICD-10-CM | POA: Diagnosis not present

## 2017-09-09 LAB — CBC
HCT: 39.1 % (ref 36.0–46.0)
Hemoglobin: 12.9 g/dL (ref 12.0–15.0)
MCHC: 32.9 g/dL (ref 30.0–36.0)
MCV: 93.5 fl (ref 78.0–100.0)
PLATELETS: 221 10*3/uL (ref 150.0–400.0)
RBC: 4.18 Mil/uL (ref 3.87–5.11)
RDW: 13.2 % (ref 11.5–15.5)
WBC: 6.6 10*3/uL (ref 4.0–10.5)

## 2017-09-09 MED ORDER — ZOSTER VAC RECOMB ADJUVANTED 50 MCG/0.5ML IM SUSR: 0.5000 mL | Freq: Once | INTRAMUSCULAR | 1 refills | Status: AC

## 2017-09-09 NOTE — Progress Notes (Signed)
   Subjective:    Patient ID: Sara [name], female    DOB: 11-04-1951, 66 y.o.   MRN: [id number]  HPI The patient is a 66 YO female coming in for a bruise on the leg which happened about 2-3 weeks ago. She did partially fall in the tub and hit her leg. Initially no pain and able to walk on it. Then this bruise and swelling happened and she used ice. Has not taken any pain medication otc for it. She has noticed some decrease since initial but was concerned that it was not gone yet.   Review of Systems  Constitutional: Negative.   Respiratory: Negative for cough, chest tightness and shortness of breath.   Cardiovascular: Negative for chest pain, palpitations and leg swelling.  Gastrointestinal: Negative for abdominal distention, abdominal pain, constipation, diarrhea, nausea and vomiting.  Musculoskeletal: Negative.   Skin: Negative.   Neurological: Negative.   Hematological: Bruises/bleeds easily.      Objective:   Physical Exam  Constitutional: She is oriented to person, place, and time. She appears well-developed and well-nourished.  HENT:  Head: Normocephalic and atraumatic.  Eyes: EOM are normal.  Neck: Normal range of motion.  Cardiovascular: Normal rate and regular rhythm.   Pulmonary/Chest: Effort normal and breath sounds normal. No respiratory distress. She has no wheezes. She has no rales.  Abdominal: Soft. Bowel sounds are normal. She exhibits no distension. There is no tenderness. There is no rebound.  Musculoskeletal: She exhibits tenderness. She exhibits no edema.  Small hematoma on the left leg before the knee, mildly tender to palpation.   Neurological: She is alert and oriented to person, place, and time. Coordination normal.  Skin: Skin is warm and dry.   Vitals:   09/09/17 1451  BP: 110/76  Pulse: 68  Temp: 98.2 F (36.8 C)  TempSrc: Oral  SpO2: 100%  Weight: 163 lb (73.9 kg)  Height: 5\' 4"  (1.626 m)      Assessment & Plan:

## 2017-09-10 DIAGNOSIS — S8010XA Contusion of unspecified lower leg, initial encounter: Secondary | ICD-10-CM | POA: Insufficient documentation

## 2017-09-10 DIAGNOSIS — T148XXA Other injury of unspecified body region, initial encounter: Secondary | ICD-10-CM | POA: Insufficient documentation

## 2017-09-10 NOTE — Assessment & Plan Note (Signed)
Checking CBC for stability, hematoma present and appears to be healing. Reassurance given and could take another several months for full healing.

## 2017-09-12 ENCOUNTER — Telehealth: Payer: Self-pay

## 2017-09-12 ENCOUNTER — Telehealth: Payer: Self-pay | Admitting: Internal Medicine

## 2017-09-12 NOTE — Telephone Encounter (Signed)
error 

## 2017-09-12 NOTE — Telephone Encounter (Signed)
Flu entered patient informed on my chart that the place that did her mammogram would have to fax the results in order fo [place] to get that updated for her

## 2017-09-12 NOTE — Telephone Encounter (Signed)
Patient was checking over her mychart. She wanted to make [place] aware of the information that needed to be updated.   High flu dose 10.2.18  Mammogram  5.23.18

## 2017-09-30 DIAGNOSIS — N815 Vaginal enterocele: Secondary | ICD-10-CM | POA: Diagnosis not present

## 2017-12-30 ENCOUNTER — Ambulatory Visit: Payer: Medicare Other

## 2018-01-28 NOTE — Progress Notes (Signed)
Subjective:   Sara [name] is a 67 y.o. female who presents for Medicare Annual (Subsequent) preventive examination.  Review of Systems:  No ROS.  Medicare Wellness Visit. Additional risk factors are reflected in the social history.  Cardiac Risk Factors include: advanced age (>10men, >63 women);dyslipidemia Sleep patterns: gets up 2 times nightly to void and sleeps 6-7 hours nightly.   Home Safety/Smoke Alarms: Feels safe in home. Smoke alarms in place.  Living environment; residence and Firearm Safety: 1-story house/ trailer, no firearms. . Seat Belt Safety/Bike Helmet: Wears seat belt.     Objective:     Vitals: BP 134/76   Pulse 65   Resp 18   Ht 5\' 4"  (1.626 m)   Wt 163 lb (73.9 kg)   SpO2 98%   BMI 27.98 kg/m   Body mass index is 27.98 kg/m.  Advanced Directives 01/29/2018 12/30/2016 10/12/2013 10/08/2013  Does Patient Have a Medical Advance Directive? Yes Yes Patient has advance directive, copy not in chart Patient has advance directive, copy not in chart  Type of Advance Directive [place];Living will [place];Living will [place];Living will [place];Living will  Does patient want to make changes to medical advance directive? - - No -  Copy of [place] in Chart? No - copy requested No - copy requested Copy requested from family Copy requested from family  Pre-existing out of facility DNR order (yellow form or pink MOST form) - - - No    Tobacco Social History   Tobacco Use  Smoking Status Never Smoker  Smokeless Tobacco Never Used     Counseling given: Not Answered  Past Medical History:  Diagnosis Date  . ANXIETY   . Esophageal stricture   . PLANTAR FASCIITIS, BILATERAL   . PONV (postoperative nausea and vomiting)   . SUPRAVENTRICULAR TACHYCARDIA   . Urge incontinence    Past Surgical History:  Procedure Laterality Date  . Adenosine Myoview  03/14/05    . Cather ablation  04/2007  . EYE SURGERY Bilateral 10-2010   cataracts with lens replacments  . HERNIA REPAIR  15 yrs ago  . NECK LIFT  05/2014   plastic  surgery for a neck lift  . VAGINAL HYSTERECTOMY  yrs ago   Family History  Problem Relation Age of Onset  . Arthritis Mother   . Hypertension Mother   . Coronary artery disease Father   . Heart disease Father   . Breast cancer Maternal Grandmother   . Stroke Paternal Grandmother   . Coronary artery disease Paternal Grandfather   . Stomach cancer Neg Hx   . Colon cancer Neg Hx    Social History   Socioeconomic History  . Marital status: Married    Spouse name: None  . Number of children: None  . Years of education: None  . Highest education level: None  Social Needs  . Financial resource strain: Not hard at all  . Food insecurity - worry: Never true  . Food insecurity - inability: Never true  . Transportation needs - medical: No  . Transportation needs - non-medical: No  Occupational History  . Occupation: Environmental health practitioner: [employer]  Tobacco Use  . Smoking status: Never Smoker  . Smokeless tobacco: Never Used  Substance and Sexual Activity  . Alcohol use: Yes    Alcohol/week: 0.0 oz    Comment: red wine 4 times per week  .  Drug use: No  . Sexual activity: Not Currently    Partners: Male  Other Topics Concern  . None  Social History Narrative  . None    Outpatient Encounter Medications as of 01/29/2018  Medication Sig  . b complex vitamins capsule Take 1 capsule by mouth daily.  . Biotin 1000 MCG tablet Take 1,000 mcg by mouth daily.   . Cholecalciferol (VITAMIN D) 2000 units CAPS Take by mouth.  . conjugated estrogens (PREMARIN) vaginal cream Place 1 Applicatorful vaginally once a week.  . magnesium 30 MG tablet Take 30 mg by mouth 2 (two) times daily.  . Multiple Vitamins-Minerals (MULTIVITAMIN PO) Take 1 tablet by mouth daily.  Marland Kitchen MYRBETRIQ 50 MG TB24 tablet   . ranitidine (ZANTAC) 150  MG tablet TAKE 1 TABLET BY MOUTH AT BEDTIME.  . vitamin C (ASCORBIC ACID) 500 MG tablet Take 500 mg by mouth daily.  . [DISCONTINUED] oxybutynin (DITROPAN) 5 MG tablet Take 1 tablet (5 mg total) by mouth 2 (two) times daily. (Patient not taking: Reported on 01/29/2018)   No facility-administered encounter medications on file as of 01/29/2018.     Activities of Daily Living In your present state of health, do you have any difficulty performing the following activities: 01/29/2018  Hearing? N  Vision? N  Difficulty concentrating or making decisions? N  Walking or climbing stairs? N  Dressing or bathing? N  Doing errands, shopping? N  Preparing Food and eating ? N  Using the Toilet? N  In the past six months, have you accidently leaked urine? Y  Do you have problems with loss of bowel control? N  Managing your Medications? N  Managing your Finances? N  Housekeeping or managing your Housekeeping? N  Some recent data might be hidden    Patient Care Team: Hoyt Koch, MD as PCP - General (Internal Medicine) Azucena Fallen, MD as Consulting Physician (Obstetrics and Gynecology)    Assessment:   This is a routine wellness examination for Sara [name]. Physical assessment deferred to PCP.   Exercise Activities and Dietary recommendations Current Exercise Habits: The patient does not participate in regular exercise at present  Diet (meal preparation, eat out, water intake, caffeinated beverages, dairy products, fruits and vegetables): in general, a "healthy" diet  , well balanced   Reviewed heart healthy diet, encouraged patient to increase daily water intake.  Goals    . Exercise 3x per week (30 min per time)     Increase my steps to 7000    . Patient Stated     Maintain current health status. Enjoy life and family.       Fall Risk Fall Risk  01/29/2018 12/30/2016 12/19/2015 12/09/2014 03/21/2014  Falls in the past year? No No No No No    Depression Screen PHQ 2/9 Scores  01/29/2018 12/30/2016 12/19/2015 12/09/2014  PHQ - 2 Score 0 0 0 0  PHQ- 9 Score 1 - - -     Cognitive Function MMSE - Mini Mental State Exam 12/30/2016  Orientation to time 5  Orientation to Place 5  Registration 3  Attention/ Calculation 5  Recall 3  Language- name 2 objects 2  Language- repeat 1  Language- follow 3 step command 3  Language- read & follow direction 1  Write a sentence 1  Copy design 1  Total score 30       Ad8 score reviewed for issues:  Issues making decisions: no  Less interest in hobbies / activities: no  Repeats questions,  stories (family complaining): no  Trouble using ordinary gadgets (microwave, computer, phone):no  Forgets the month or year: no  Mismanaging finances: no  Remembering appts: no  Daily problems with thinking and/or memory: no Ad8 score is= 0  Immunization History  Administered Date(s) Administered  . Influenza Split 11/13/2011  . Influenza Whole 09/06/2009, 08/25/2012  . Influenza,inj,Quad PF,6+ Mos 09/16/2013, 12/09/2014, 12/19/2015  . Influenza-Unspecified 08/26/2017  . Pneumococcal Conjugate-13 03/21/2014  . Pneumococcal Polysaccharide-23 12/10/1999, 12/19/2015  . Td 11/25/1998, 09/06/2009  . Zoster 01/23/2010   Screening Tests Health Maintenance  Topic Date Due  . COLONOSCOPY  10/17/2018  . MAMMOGRAM  04/17/2019  . TETANUS/TDAP  09/07/2019  . INFLUENZA VACCINE  Completed  . DEXA SCAN  Completed  . Hepatitis C Screening  Completed  . PNA vac Low Risk Adult  Completed      Plan:     Continue doing brain stimulating activities (puzzles, reading, adult coloring books, staying active) to keep memory sharp.   Continue to eat heart healthy diet (full of fruits, vegetables, whole grains, lean protein, water--limit salt, fat, and sugar intake) and increase physical activity as tolerated.  I have personally reviewed and noted the following in the patient's chart:   . Medical and social history . Use of alcohol,  tobacco or illicit drugs  . Current medications and supplements . Functional ability and status . Nutritional status . Physical activity . Advanced directives . List of other physicians . Vitals . Screenings to include cognitive, depression, and falls . Referrals and appointments  In addition, I have reviewed and discussed with patient certain preventive protocols, quality metrics, and best practice recommendations. A written personalized care plan for preventive services as well as general preventive health recommendations were provided to patient.     Michiel Cowboy, RN  01/29/2018

## 2018-01-29 ENCOUNTER — Telehealth: Payer: Self-pay | Admitting: *Deleted

## 2018-01-29 ENCOUNTER — Ambulatory Visit (INDEPENDENT_AMBULATORY_CARE_PROVIDER_SITE_OTHER): Payer: Medicare Other | Admitting: *Deleted

## 2018-01-29 VITALS — BP 134/76 | HR 65 | Resp 18 | Ht 64.0 in | Wt 163.0 lb

## 2018-01-29 DIAGNOSIS — Z0000 Encounter for general adult medical examination without abnormal findings: Secondary | ICD-10-CM

## 2018-01-29 NOTE — Telephone Encounter (Signed)
Would recommend visit to discuss and refer if appropriate.

## 2018-01-29 NOTE — Telephone Encounter (Signed)
During AWV, patient stated that she continues to have issues with urinary incontinence. She has a friend who goes to urology for physical therapy which has been effective  and asked if she could have a referral to urology for treatment.

## 2018-01-29 NOTE — Patient Instructions (Addendum)
Continue doing brain stimulating activities (puzzles, reading, adult coloring books, staying active) to keep memory sharp.   Continue to eat heart healthy diet (full of fruits, vegetables, whole grains, lean protein, water--limit salt, fat, and sugar intake) and increase physical activity as tolerated.  Ms. Sara [name] , Thank you for taking time to come for your Medicare Wellness Visit. I appreciate your ongoing commitment to your health goals. Please review the following plan we discussed and let me know if I can assist you in the future.   These are the goals we discussed: Goals    . Exercise 3x per week (30 min per time)     Increase my steps to 7000    . Patient Stated     Maintain current health status. Enjoy life and family.       This is a list of the screening recommended for you and due dates:  Health Maintenance  Topic Date Due  . Mammogram  11/09/2016  . Colon Cancer Screening  10/17/2018  . Tetanus Vaccine  09/07/2019  . Flu Shot  Completed  . DEXA scan (bone density measurement)  Completed  .  Hepatitis C: One time screening is recommended by Center for Disease Control  (CDC) for  adults born from [birth year] through 1966.   Completed  . Pneumonia vaccines  Completed

## 2018-01-30 NOTE — Progress Notes (Signed)
Medical screening examination/treatment/procedure(s) were performed by non-physician practitioner and as supervising physician I was immediately available for consultation/collaboration. I agree with above. Elizabeth A Crawford, MD 

## 2018-01-30 NOTE — Telephone Encounter (Signed)
Called to inform patient that Dr. Sharlet Salina would like her to schedule an appointment to discuss her urinary incontinence. Patient agreed and an appointment was scheduled for 02/02/18

## 2018-02-02 ENCOUNTER — Ambulatory Visit (INDEPENDENT_AMBULATORY_CARE_PROVIDER_SITE_OTHER): Payer: Medicare Other | Admitting: Internal Medicine

## 2018-02-02 ENCOUNTER — Encounter: Payer: Self-pay | Admitting: Internal Medicine

## 2018-02-02 VITALS — BP 124/80 | HR 72 | Temp 98.2°F | Ht 64.0 in | Wt 162.0 lb

## 2018-02-02 DIAGNOSIS — N3941 Urge incontinence: Secondary | ICD-10-CM | POA: Diagnosis not present

## 2018-02-02 NOTE — Progress Notes (Signed)
   Subjective:    Patient ID: Sara [name], female    DOB: 03-13-1951, 67 y.o.   MRN: [id number]  HPI The patient is a 67 YO female coming in for urinary incontinence going on for several years. Just getting gradually worse. She denies acute worsening or symptoms. She denies fevers or chills. Is currently taking myrbetriq and this is not helping enough. She has tried oxybutynin in the past and did get side effects from this. She denies symptoms of infection currently. She denies burning. This happens several times per day. Mostly when she gets home she cannot wait long enough to get to the bathroom. She does not have problems at work.   Review of Systems  Constitutional: Negative.   Respiratory: Negative for cough, chest tightness and shortness of breath.   Cardiovascular: Negative for chest pain, palpitations and leg swelling.  Gastrointestinal: Negative for abdominal distention, abdominal pain, constipation, diarrhea, nausea and vomiting.  Genitourinary: Positive for frequency. Negative for difficulty urinating, dyspareunia, dysuria, genital sores, hematuria, pelvic pain, urgency, vaginal bleeding, vaginal discharge and vaginal pain.  Musculoskeletal: Negative.   Skin: Negative.   Neurological: Negative.   Psychiatric/Behavioral: Negative.       Objective:   Physical Exam  Constitutional: She is oriented to person, place, and time. She appears well-developed and well-nourished.  HENT:  Head: Normocephalic and atraumatic.  Eyes: EOM are normal.  Neck: Normal range of motion.  Cardiovascular: Normal rate and regular rhythm.  Pulmonary/Chest: Effort normal and breath sounds normal. No respiratory distress. She has no wheezes. She has no rales.  Abdominal: Soft. Bowel sounds are normal. She exhibits no distension. There is no tenderness. There is no rebound.  Musculoskeletal: She exhibits no edema.  Neurological: She is alert and oriented to person, place, and time.  Skin: Skin is warm  and dry.  Psychiatric: She has a normal mood and affect.   Vitals:   02/02/18 1445  BP: 124/80  Pulse: 72  Temp: 98.2 F (36.8 C)  TempSrc: Oral  SpO2: 97%  Weight: 162 lb (73.5 kg)  Height: 5\' 4"  (1.626 m)      Assessment & Plan:

## 2018-02-02 NOTE — Patient Instructions (Signed)
We will get you in to the pelvic floor training.

## 2018-02-03 NOTE — Assessment & Plan Note (Signed)
She is taking myrbetriq and wants to do pelvic floor training so referral done today.

## 2018-02-11 ENCOUNTER — Telehealth: Payer: Self-pay

## 2018-02-11 NOTE — Telephone Encounter (Signed)
I actually just sent the referral to [facility] yesterday. They should be calling her soon

## 2018-02-11 NOTE — Telephone Encounter (Signed)
She is awaiting pelvic floor therapy so the PT referral is appropriate. Ask Stanton Kidney or Cecille Rubin

## 2018-02-11 NOTE — Telephone Encounter (Signed)
Do you know anything about this. Please advise thank you

## 2018-02-11 NOTE — Telephone Encounter (Signed)
Copied from [place]. Topic: Referral - Status >> Feb 10, 2018  4:24 PM Conception Chancy, NT wrote: Patient is calling in regards to a referral to a urologist. She states she has not heard anything from them and would like a update. Please contact patient. >> Feb 11, 2018  7:51 AM [place], Isidoro Donning wrote: Do you know anything about this? I do not see a referral in her chart.

## 2018-02-11 NOTE — Telephone Encounter (Signed)
I see a referral placed but it says physical therapy not urology. Is this what the patient is talking about?

## 2018-02-11 NOTE — Telephone Encounter (Signed)
LVM informing patient.

## 2018-02-24 ENCOUNTER — Other Ambulatory Visit: Payer: Self-pay

## 2018-02-24 ENCOUNTER — Ambulatory Visit: Payer: Medicare Other | Attending: Internal Medicine | Admitting: Physical Therapy

## 2018-02-24 ENCOUNTER — Encounter: Payer: Self-pay | Admitting: Physical Therapy

## 2018-02-24 DIAGNOSIS — R278 Other lack of coordination: Secondary | ICD-10-CM | POA: Insufficient documentation

## 2018-02-24 DIAGNOSIS — M6281 Muscle weakness (generalized): Secondary | ICD-10-CM | POA: Insufficient documentation

## 2018-02-24 NOTE — Therapy (Signed)
[facility] [address] [place], [place], [address] Phone: [phone]   Fax:  [phone]  Physical Therapy Evaluation  Patient Details  Name: Sara [name] MRN: [id number] Date of Birth: Apr 24, 1951 Referring Provider: Dr. Pricilla Holm   Encounter Date: 02/24/2018  PT End of Session - 02/24/18 1220    Visit Number  1    Date for PT Re-Evaluation  04/21/18    Authorization Type  Med[employer]    PT Start Time  1145    PT Stop Time  1220    PT Time Calculation (min)  35 min    Activity Tolerance  Patient tolerated treatment well    Behavior During Therapy  [facility] for tasks assessed/performed       Past Medical History:  Diagnosis Date  . ANXIETY   . Esophageal stricture   . PLANTAR FASCIITIS, BILATERAL   . PONV (postoperative nausea and vomiting)   . SUPRAVENTRICULAR TACHYCARDIA   . Urge incontinence     Past Surgical History:  Procedure Laterality Date  . Adenosine Myoview  03/14/05  . Cather ablation  04/2007  . EYE SURGERY Bilateral 10-2010   cataracts with lens replacments  . HERNIA REPAIR  15 yrs ago  . NECK LIFT  05/2014   plastic  surgery for a neck lift  . VAGINAL HYSTERECTOMY  yrs ago    There were no vitals filed for this visit.   Subjective Assessment - 02/24/18 1145    Subjective  Patient has cut back on her fluids due to the leakage. Patient reports the urinary leakage started 15 years and has been progressively worse. The medicine is helping. I have more my problems in the morning.  When she gets up to go to the bathroom she has to rush or will leak urine. When puts key in door after being out has the urge.     Patient Stated Goals  be able to make it the bathroom when she has strong urge.      Currently in Pain?  No/denies    Multiple Pain Sites  No         [facility] PT Assessment - 02/24/18 0001      Assessment   Medical Diagnosis  N39.41 Urge incontinence     Referring Provider  Dr. Pricilla Holm    Onset Date/Surgical Date  08/25/18    Prior Therapy  none      Precautions   Precautions  None      Restrictions   Weight Bearing Restrictions  No      Balance Screen   Has the patient fallen in the past 6 months  Yes    How many times?  1 slipped in tub when bathmat moved    Has the patient had a decrease in activity level because of a fear of falling?   No    Is the patient reluctant to leave their home because of a fear of falling?   No      Home Film/video editor residence      Prior Function   Level of Independence  Independent      Cognition   Overall Cognitive Status  Within Functional Limits for tasks assessed      Posture/Postural Control   Posture/Postural Control  No significant limitations      ROM / Strength   AROM / PROM / Strength  AROM;PROM;Strength  Strength   Overall Strength Comments  abdominal strength is 2/5    Right Hip Flexion  4/5    Left Hip Flexion  4/5      Special Tests    Special Tests  Hip Special Tests    Hip Special Tests   Trendelenberg Test      Trendelenburg Test   Findings  Positive    Side  Right;Left    Comments  hip drops                Objective measurements completed on examination: See above findings.    Pelvic Floor Special Questions - 02/24/18 0001    Currently Sexually Active  No    Urinary Leakage  Yes    Pad use  1-3 in the morning    Activities that cause leaking  With strong urge;Other    Other activities that cause leaking  put key in the door, get up from the bed    Urinary urgency  No    Urinary frequency  15 min to 2 hours    Skin Integrity  Intact dryness    Prolapse  Other wears a pessary    Pelvic Floor Internal Exam  patient confirms identification and approves PT to treat and assess muscle integrity    Exam Type  Vaginal    Strength  fair squeeze, definite lift    Strength # of seconds  3                PT Education - 02/24/18 1219    Education provided  Yes    Education Details  pelvic floor exercises, urge to void supression, bladder irritants    Person(s) Educated  Patient    Methods  Explanation;Demonstration;Handout;Verbal cues    Comprehension  Returned demonstration;Verbalized understanding       PT Short Term Goals - 02/24/18 1225      PT SHORT TERM GOAL #1   Title  understand what bladder irritants are and how they affect the bladder    Time  4    Period  Weeks    Status  New    Target Date  03/24/18      PT SHORT TERM GOAL #2   Title  urge to void is >/= 25% decreased with running water and key in door    Time  4    Period  Weeks    Target Date  03/24/18      PT SHORT TERM GOAL #3   Title  drinking 8 glasses of water daily to decrease concentration of urine in bladder    Time  4    Period  Weeks    Status  New    Target Date  03/24/18        PT Long Term Goals - 02/24/18 1213      PT LONG TERM GOAL #1   Title  independent with HEP    Time  8    Period  Weeks    Status  New    Target Date  04/21/18      PT LONG TERM GOAL #2   Title  urge to urinate with water running decreased >/= 80%    Time  8    Period  Weeks    Status  New    Target Date  04/21/18      PT LONG TERM GOAL #3   Title  after sitting then stand to go to the bathroom with  urge to urinate decreased >/= 80%    Time  8    Period  Weeks    Status  New    Target Date  04/21/18      PT LONG TERM GOAL #4   Title  reduction of pads to 0-1 due to decreased urinary leakage and increased pelvic floor strength    Time  8    Period  Weeks    Status  New    Target Date  04/21/18      PT LONG TERM GOAL #5   Title  understand ways to manage vaginal dryness    Time  8    Period  Weeks    Status  New    Target Date  04/21/18             Plan - 02/24/18 1220    Clinical Impression Statement  Patient is a 67 year old female with urge incontinence that has  become worse in the past 6 months.  Patient wears a pessary for prolapse.  Patient reports no pain.  Patient will leak urine when she puts a key in the door, running water, and going from sit to stand.  Patient wears 1-3 pads per day especially in the morning.  Pelvic floor strength is 3/5 holding for 3 seconds.  Patient presents with vaginal dryness.  Abdominal strength is 3/5 and bilateral hip flexor strength is 4/5.  Patient will benefit from skilled therapy to improve pelvic floor strength and reduce the urge to urinate to reduce the number of pads she uses.     History and Personal Factors relevant to plan of care:  vaginal hysterectomy; hernia repair    Clinical Presentation  Stable    Clinical Presentation due to:  stable condition    Rehab Potential  Excellent    Clinical Impairments Affecting Rehab Potential  None    PT Frequency  1x / week    PT Duration  8 weeks    PT Treatment/Interventions  Biofeedback;Therapeutic activities;Therapeutic exercise;Patient/family education;Neuromuscular re-education;Manual techniques    PT Next Visit Plan  increasing water intake, increased holding pelvic floor contraction, reviewing urge to void, diaphgramatic breathing    PT Home Exercise Plan  progress as needed    Consulted and Agree with Plan of Care  Patient       Patient will benefit from skilled therapeutic intervention in order to improve the following deficits and impairments:  Decreased endurance, Decreased strength  Visit Diagnosis: Muscle weakness (generalized) - Plan: PT plan of care cert/re-cert  Other lack of coordination - Plan: PT plan of care cert/re-cert     Problem List Patient Active Problem List   Diagnosis Date Noted  . Bruising 09/10/2017  . Bunion 04/17/2015  . Hammer toe of left foot 04/17/2015  . Overweight (BMI 25.0-29.9) 12/09/2014  . Routine general medical examination at a health care facility 01/13/2013  . URGE INCONTINENCE 03/01/2009    Earlie Counts,  PT 02/24/18 12:28 PM   [facility] [address], [place], [place], [address] Phone: [phone]   Fax:  [phone]  Name: Sara [name] MRN: [id number] Date of Birth: 1951-06-04

## 2018-02-24 NOTE — Patient Instructions (Addendum)
Relaxation Exercises with the Urge to Void   When you experience an urge to void:  FIRST  Stop and stand very still    Sit down if you can    Don't move    You need to stay very still to maintain control  SECOND Squeeze your pelvic floor muscles 5 times, like a quick flick, to keep from leaking  THIRD Relax  Take a deep breath and then let it out  Try to make the urge go away by using relaxation and visualization techniques  FINALLY When you feel the urge go away somewhat, walk normally to the bathroom.   If the urge gets suddenly stronger on the way, you may stop again and relax to regain control. Quick Contraction: Gravity Resisted (Sitting)    Sitting, quickly squeeze then fully relax pelvic floor. Perform __1_ sets of _5__. Rest for __1_ seconds between sets. When you have the urge to go to the bathroom.  Copyright  VHI. All rights reserved.  Slow Contraction: Gravity Resisted (Sitting)    Sitting, slowly squeeze pelvic floor for _3__ seconds. Rest for ___3 seconds. Repeat _10__ times. Do _3__ times a day.  Copyright  VHI. All rights reserved.  Certain foods and liquids will decrease the pH making the urine more acidic.  Urinary urgency increases when the urine has a low pH.  Most common irritants: alcohol, carbonated beverages and caffinated beverages.  Foods to avoid: apple juice, apples, ascorbic acid, canteloupes, chili, citrus fruits, coffee, cranberries, grapes, guava, peaches, pepper, pineapple, plums, strawberries, tea, tomatoes, and vinegar.  Drinking plenty of water may help to increase the pH and dilute out any of the effects of specific irritants.  Foods that are NOT irritating to the bladder include: Pears, papayas, sun-brewed teas, watermelons, non-citrus herbal teas, apricots, kava and low-acid instant drinks (Postum) [facility] [address], [place], [place] [address] Phone # [phone] Fax [phone]

## 2018-03-10 ENCOUNTER — Ambulatory Visit: Payer: Medicare Other | Admitting: Physical Therapy

## 2018-03-10 ENCOUNTER — Encounter: Payer: Self-pay | Admitting: Physical Therapy

## 2018-03-10 DIAGNOSIS — M6281 Muscle weakness (generalized): Secondary | ICD-10-CM | POA: Diagnosis not present

## 2018-03-10 DIAGNOSIS — R278 Other lack of coordination: Secondary | ICD-10-CM | POA: Diagnosis not present

## 2018-03-10 NOTE — Therapy (Addendum)
[facility] [address] [place], [place], [address] Phone: [phone]   Fax:  [phone]  Physical Therapy Treatment  Patient Details  Name: Sara [name] MRN: [id number] Date of Birth: 1951/04/16 Referring Provider: Dr. Pricilla Holm   Encounter Date: 03/10/2018  PT End of Session - 03/10/18 1140    Visit Number  2    Date for PT Re-Evaluation  04/21/18    Authorization Type  [employer]    PT Start Time  1100    PT Stop Time  1140    PT Time Calculation (min)  40 min    Activity Tolerance  Patient tolerated treatment well    Behavior During Therapy  [facility] for tasks assessed/performed       Past Medical History:  Diagnosis Date  . ANXIETY   . Esophageal stricture   . PLANTAR FASCIITIS, BILATERAL   . PONV (postoperative nausea and vomiting)   . SUPRAVENTRICULAR TACHYCARDIA   . Urge incontinence     Past Surgical History:  Procedure Laterality Date  . Adenosine Myoview  03/14/05  . Cather ablation  04/2007  . EYE SURGERY Bilateral 10-2010   cataracts with lens replacments  . HERNIA REPAIR  15 yrs ago  . NECK LIFT  05/2014   plastic  surgery for a neck lift  . VAGINAL HYSTERECTOMY  yrs ago    There were no vitals filed for this visit.  Subjective Assessment - 03/10/18 1100    Subjective  The urge to void technique is helpful.     Patient Stated Goals  be able to make it the bathroom when she has strong urge.      Currently in Pain?  No/denies                       [facility] Adult PT Treatment/Exercise - 03/10/18 0001      Self-Care   Self-Care  Other Self-Care Comments    Other Self-Care Comments   drinking 78 ounes of water      Neuro Re-ed    Neuro Re-ed Details   visualization and relaxation techniques to reduce urge to urinate, diaphragmatic breathing             PT Education - 03/10/18 1135    Education provided  Yes    Education Details  Access  Code: [email or web address]; drinking 78 ounces of water; ways to relax and visualization    Person(s) Educated  Patient    Methods  Explanation;Demonstration;Verbal cues;Handout    Comprehension  Returned demonstration;Verbalized understanding       PT Short Term Goals - 03/10/18 1143      PT SHORT TERM GOAL #1   Title  understand what bladder irritants are and how they affect the bladder    Time  4    Period  Weeks    Status  Achieved      PT SHORT TERM GOAL #2   Title  urge to void is >/= 25% decreased with running water and key in door    Time  4    Period  Weeks    Status  On-going      PT SHORT TERM GOAL #3   Title  drinking 8 glasses of water daily to decrease concentration of urine in bladder    Time  4    Period  Weeks    Status  On-going  PT Long Term Goals - 02/24/18 1213      PT LONG TERM GOAL #1   Title  independent with HEP    Time  8    Period  Weeks    Status  New    Target Date  04/21/18      PT LONG TERM GOAL #2   Title  urge to urinate with water running decreased >/= 80%    Time  8    Period  Weeks    Status  New    Target Date  04/21/18      PT LONG TERM GOAL #3   Title  after sitting then stand to go to the bathroom with urge to urinate decreased >/= 80%    Time  8    Period  Weeks    Status  New    Target Date  04/21/18      PT LONG TERM GOAL #4   Title  reduction of pads to 0-1 due to decreased urinary leakage and increased pelvic floor strength    Time  8    Period  Weeks    Status  New    Target Date  04/21/18      PT LONG TERM GOAL #5   Title  understand ways to manage vaginal dryness    Time  8    Period  Weeks    Status  New    Target Date  04/21/18            Plan - 03/10/18 1059    Clinical Impression Statement  Patient had a urine smell while in therapy.  Patient is able to perform diahragmatic breathing without lifting chest.  Patient needed minimal verbal cues to breath with exercise.  Patient continues to wear 1-3  pads per day.  Patient will benefit from skilled therapy to improve pelvic floor strength and reduce the urge to urinate to reduce the number of pads she uses.     Rehab Potential  Excellent    Clinical Impairments Affecting Rehab Potential  None    PT Frequency  1x / week    PT Duration  8 weeks    PT Treatment/Interventions  Biofeedback;Therapeutic activities;Therapeutic exercise;Patient/family education;Neuromuscular re-education;Manual techniques    PT Next Visit Plan  check on pad usage, pelvic floor EMG with exercise    PT Home Exercise Plan  Access Code: [email or web address]    Recommended Other Services  MD signed initial evaluation    Consulted and Agree with Plan of Care  Patient       Patient will benefit from skilled therapeutic intervention in order to improve the following deficits and impairments:  Decreased endurance, Decreased strength  Visit Diagnosis: Muscle weakness (generalized)  Other lack of coordination     Problem List Patient Active Problem List   Diagnosis Date Noted  . Bruising 09/10/2017  . Bunion 04/17/2015  . Hammer toe of left foot 04/17/2015  . Overweight (BMI 25.0-29.9) 12/09/2014  . Routine general medical examination at a health care facility 01/13/2013  . URGE INCONTINENCE 03/01/2009    Earlie Counts, PT 03/10/18 11:44 AM   [facility] [address], [place], [place], [address] Phone: [phone]   Fax:  [phone]  Name: Sara [name] MRN: [id number] Date of Birth: [birth year]  PHYSICAL THERAPY DISCHARGE SUMMARY  Visits from Start of Care: 2  Current functional level related to goals / functional outcomes: See above.    Remaining  deficits: See above. Patient did not return after her last visit.    Education / Equipment: HEP  Plan:                                                    Patient goals were not met. Patient is being discharged due to not returning since the last  visit.  Thank you for the referral. Earlie Counts, PT 08/10/18 12:13 PM  ?????

## 2018-03-10 NOTE — Patient Instructions (Addendum)
Visualization techniques 1. Visual a place you feel most relaxed in and focus on the sounds, colors, smells 2. Visualize a picture you enjoy ( painting, photograph) 3. Thing a smell that you enjoy ( something baking, smell of a flower or season) 4. Take a book and read it  5.  Focus on a song that enjoy or on the radio Goal is to be able to hold urine every 2-3 hours   Work toward 78 ounces of water  Per day  Access Code: [email or web address]  URL: [email or web address]  Date: 03/10/2018  Prepared by: Earlie Counts   Exercises Seated Diaphragmatic Breathing - 5 reps - 1 sets - 1x daily - 7x weekly Seated Pelvic Floor Contraction with Hip Abduction and Resistance Loop - 10 reps - 1 sets - 5 hold - 1x daily - 7x weekly Seated Pelvic Floor Contraction with Isometric Hip Adduction - 10 reps - 1 sets - 5 hold - 1x daily - 7x weekly Seated Hip Flexion - 5 reps - 3 sets - 1 hold - 1x daily - 7x weekly Sit to Stand with Pelvic Floor Contraction - 10 reps - 1 sets - 1x daily - 7x weekly Supine Transversus Abdominis Bracing - Hands on Ground - 10 reps - 1 sets - 5 hold - 1x daily - 7x weekly Bent Knee Fallouts - 10 reps - 1 sets - 1x daily - 7x weekly [facility] [address] [place], [place] [address] Phone # [phone] Fax [phone]

## 2018-03-31 ENCOUNTER — Encounter: Payer: Medicare Other | Admitting: Physical Therapy

## 2018-04-24 DIAGNOSIS — Z1231 Encounter for screening mammogram for malignant neoplasm of breast: Secondary | ICD-10-CM | POA: Diagnosis not present

## 2018-04-24 DIAGNOSIS — N3281 Overactive bladder: Secondary | ICD-10-CM | POA: Diagnosis not present

## 2018-04-24 DIAGNOSIS — N952 Postmenopausal atrophic vaginitis: Secondary | ICD-10-CM | POA: Diagnosis not present

## 2018-04-24 DIAGNOSIS — Z124 Encounter for screening for malignant neoplasm of cervix: Secondary | ICD-10-CM | POA: Diagnosis not present

## 2018-04-24 DIAGNOSIS — N8111 Cystocele, midline: Secondary | ICD-10-CM | POA: Diagnosis not present

## 2018-06-17 DIAGNOSIS — D485 Neoplasm of uncertain behavior of skin: Secondary | ICD-10-CM | POA: Diagnosis not present

## 2018-06-17 DIAGNOSIS — L281 Prurigo nodularis: Secondary | ICD-10-CM | POA: Diagnosis not present

## 2018-06-17 DIAGNOSIS — D1801 Hemangioma of skin and subcutaneous tissue: Secondary | ICD-10-CM | POA: Diagnosis not present

## 2018-06-17 DIAGNOSIS — L821 Other seborrheic keratosis: Secondary | ICD-10-CM | POA: Diagnosis not present

## 2018-06-19 DIAGNOSIS — D485 Neoplasm of uncertain behavior of skin: Secondary | ICD-10-CM | POA: Diagnosis not present

## 2018-08-28 ENCOUNTER — Other Ambulatory Visit (INDEPENDENT_AMBULATORY_CARE_PROVIDER_SITE_OTHER): Payer: Medicare Other

## 2018-08-28 ENCOUNTER — Ambulatory Visit (INDEPENDENT_AMBULATORY_CARE_PROVIDER_SITE_OTHER): Payer: Medicare Other | Admitting: Internal Medicine

## 2018-08-28 ENCOUNTER — Encounter: Payer: Self-pay | Admitting: Internal Medicine

## 2018-08-28 VITALS — BP 130/78 | HR 74 | Temp 98.1°F | Ht 64.0 in | Wt 164.0 lb

## 2018-08-28 DIAGNOSIS — T148XXA Other injury of unspecified body region, initial encounter: Secondary | ICD-10-CM

## 2018-08-28 DIAGNOSIS — Z13220 Encounter for screening for lipoid disorders: Secondary | ICD-10-CM | POA: Diagnosis not present

## 2018-08-28 DIAGNOSIS — Z23 Encounter for immunization: Secondary | ICD-10-CM | POA: Diagnosis not present

## 2018-08-28 DIAGNOSIS — K219 Gastro-esophageal reflux disease without esophagitis: Secondary | ICD-10-CM | POA: Diagnosis not present

## 2018-08-28 LAB — CBC
HCT: 41.8 % (ref 36.0–46.0)
Hemoglobin: 14.1 g/dL (ref 12.0–15.0)
MCHC: 33.8 g/dL (ref 30.0–36.0)
MCV: 92.6 fl (ref 78.0–100.0)
Platelets: 232 10*3/uL (ref 150.0–400.0)
RBC: 4.51 Mil/uL (ref 3.87–5.11)
RDW: 13.4 % (ref 11.5–15.5)
WBC: 5.3 10*3/uL (ref 4.0–10.5)

## 2018-08-28 LAB — COMPREHENSIVE METABOLIC PANEL
ALT: 16 U/L (ref 0–35)
AST: 19 U/L (ref 0–37)
Albumin: 4.3 g/dL (ref 3.5–5.2)
Alkaline Phosphatase: 74 U/L (ref 39–117)
BILIRUBIN TOTAL: 0.5 mg/dL (ref 0.2–1.2)
BUN: 12 mg/dL (ref 6–23)
CO2: 31 meq/L (ref 19–32)
CREATININE: 0.83 mg/dL (ref 0.40–1.20)
Calcium: 9.7 mg/dL (ref 8.4–10.5)
Chloride: 104 mEq/L (ref 96–112)
GFR: 72.5 mL/min (ref >60.00)
Glucose, Bld: 91 mg/dL (ref 70–99)
Potassium: 4.4 mEq/L (ref 3.5–5.1)
Sodium: 141 mEq/L (ref 135–145)
Total Protein: 7.3 g/dL (ref 6.0–8.3)

## 2018-08-28 LAB — LIPID PANEL
CHOL/HDL RATIO: 3
CHOLESTEROL: 208 mg/dL — AB (ref 0–200)
HDL: 69.9 mg/dL (ref >39.00)
LDL CALC: 116 mg/dL — AB (ref 0–99)
NonHDL: 137.72
TRIGLYCERIDES: 111 mg/dL (ref 0.0–149.0)
VLDL: 22.2 mg/dL (ref 0.0–40.0)

## 2018-08-28 LAB — TSH: TSH: 1.14 u[IU]/mL (ref 0.35–4.50)

## 2018-08-28 MED ORDER — FAMOTIDINE 20 MG PO TABS: 20.0000 mg | ORAL_TABLET | Freq: Two times a day (BID) | ORAL | 1 refills | Status: DC

## 2018-08-28 NOTE — Progress Notes (Signed)
   Subjective:    Patient ID: Sara [name], female    DOB: December 12, 1950, 67 y.o.   MRN: [id number]  HPI The patient is a 67 YO female coming in for follow up of her GERD. She was taking zantac prn but now that there is bad reports about it she would like to switch that to something else. Denies current symptoms. She uses it mostly when she eats too late at night or the wrong thing in the evening. Denies weight loss or blood in stool.   Review of Systems  Constitutional: Negative.   HENT: Negative.   Eyes: Negative.   Respiratory: Negative for cough, chest tightness and shortness of breath.   Cardiovascular: Negative for chest pain, palpitations and leg swelling.  Gastrointestinal: Negative for abdominal distention, abdominal pain, constipation, diarrhea, nausea and vomiting.  Musculoskeletal: Negative.   Skin: Negative.   Neurological: Negative.   Psychiatric/Behavioral: Negative.       Objective:   Physical Exam  Constitutional: She is oriented to person, place, and time. She appears well-developed and well-nourished.  HENT:  Head: Normocephalic and atraumatic.  Eyes: EOM are normal.  Neck: Normal range of motion.  Cardiovascular: Normal rate and regular rhythm.  Pulmonary/Chest: Effort normal and breath sounds normal. No respiratory distress. She has no wheezes. She has no rales.  Abdominal: Soft. Bowel sounds are normal. She exhibits no distension. There is no tenderness. There is no rebound.  Musculoskeletal: She exhibits no edema.  Neurological: She is alert and oriented to person, place, and time. Coordination normal.  Skin: Skin is warm and dry.  Psychiatric: She has a normal mood and affect.   Vitals:   08/28/18 1334  BP: 130/78  Pulse: 74  Temp: 98.1 F (36.7 C)  TempSrc: Oral  SpO2: 98%  Weight: 164 lb (74.4 kg)  Height: 5\' 4"  (1.626 m)      Assessment & Plan:  Flu shot given at visit

## 2018-08-28 NOTE — Assessment & Plan Note (Signed)
Recheck CBC today. 

## 2018-08-28 NOTE — Assessment & Plan Note (Signed)
Stop zantac and rx for pepcid qhs prn for symptoms or known triggers.

## 2018-08-28 NOTE — Patient Instructions (Addendum)
We are checking the blood work today.  

## 2018-09-01 DIAGNOSIS — N952 Postmenopausal atrophic vaginitis: Secondary | ICD-10-CM | POA: Diagnosis not present

## 2018-09-01 DIAGNOSIS — N8111 Cystocele, midline: Secondary | ICD-10-CM | POA: Diagnosis not present

## 2018-10-19 ENCOUNTER — Telehealth: Payer: Self-pay | Admitting: Internal Medicine

## 2018-10-19 DIAGNOSIS — Z1211 Encounter for screening for malignant neoplasm of colon: Secondary | ICD-10-CM

## 2018-10-19 NOTE — Telephone Encounter (Signed)
Fine to order 

## 2018-10-19 NOTE — Telephone Encounter (Signed)
I did not see where this was ordered yet, is it okay to order?

## 2018-10-19 NOTE — Telephone Encounter (Signed)
Copied from [place] [phone]. Topic: General - Other >> Oct 19, 2018 12:18 PM Bea Graff, NT wrote: Reason for CRM: Pt states she was suppose to get the cologuard test in the mail last month but has not received the test and she is checking status. Please advise.

## 2018-10-19 NOTE — Telephone Encounter (Signed)
cologuard ordered.

## 2018-12-21 DIAGNOSIS — Z961 Presence of intraocular lens: Secondary | ICD-10-CM | POA: Diagnosis not present

## 2018-12-21 DIAGNOSIS — H35033 Hypertensive retinopathy, bilateral: Secondary | ICD-10-CM | POA: Diagnosis not present

## 2018-12-21 DIAGNOSIS — H26492 Other secondary cataract, left eye: Secondary | ICD-10-CM | POA: Diagnosis not present

## 2018-12-21 DIAGNOSIS — H26491 Other secondary cataract, right eye: Secondary | ICD-10-CM | POA: Diagnosis not present

## 2019-01-04 ENCOUNTER — Encounter: Payer: Self-pay | Admitting: Neurology

## 2019-01-19 ENCOUNTER — Telehealth: Payer: Self-pay

## 2019-01-19 DIAGNOSIS — Z1211 Encounter for screening for malignant neoplasm of colon: Secondary | ICD-10-CM

## 2019-01-19 NOTE — Telephone Encounter (Signed)
Referral placed.

## 2019-01-19 NOTE — Telephone Encounter (Signed)
Copied from [place] [phone]. Topic: General - Other >> Jan 19, 2019 10:56 AM Marin Olp L wrote: Reason for CRM: Patient has a home cologaurd test since October that she has not used yet because she has had diarrhea and wants to know if Dr. Sharlet Salina can order colonoscopy instead?

## 2019-01-19 NOTE — Telephone Encounter (Signed)
Patient informed referral has been placed

## 2019-01-19 NOTE — Addendum Note (Signed)
Addended by: Pricilla Holm A on: 01/19/2019 11:32 AM   Modules accepted: Orders

## 2019-02-02 NOTE — Progress Notes (Addendum)
Subjective:   Sara [name] is a 68 y.o. female who presents for Medicare Annual (Subsequent) preventive examination.  Review of Systems:  No ROS.  Medicare Wellness Visit. Additional risk factors are reflected in the social history. Cardiac Risk Factors include: advanced age (>53men, >17 women) Sleep patterns: gets up 1-2 times nightly to void and sleeps 6-7 hours nightly.    Home Safety/Smoke Alarms: Feels safe in home. Smoke alarms in place.  Living environment; residence and Firearm Safety: 1-story house/ trailer. Lives with husband, no needs for DME, good support system Seat Belt Safety/Bike Helmet: Wears seat belt.     Objective:     Vitals: BP 126/63   Pulse 67   Resp 17   Ht 5\' 4"  (1.626 m)   Wt 166 lb (75.3 kg)   SpO2 98%   BMI 28.49 kg/m   Body mass index is 28.49 kg/m.  Advanced Directives 02/03/2019 02/24/2018 01/29/2018 12/30/2016 10/12/2013 10/08/2013  Does Patient Have a Medical Advance Directive? Yes Yes Yes Yes Patient has advance directive, copy not in chart Patient has advance directive, copy not in chart  Type of Advance Directive [place];Living will Living will [place];Living will [place];Living will [place];Living will [place];Living will  Does patient want to make changes to medical advance directive? - No - Patient declined - - No -  Copy of Healthcare Power of Attorney in Chart? No - copy requested - No - copy requested No - copy requested Copy requested from family Copy requested from family  Pre-existing out of facility DNR order (yellow form or pink MOST form) - - - - - No    Tobacco Social History   Tobacco Use  Smoking Status Never Smoker  Smokeless Tobacco Never Used     Counseling given: Not Answered  Past Medical History:  Diagnosis Date  . ANXIETY   . Esophageal stricture   . PLANTAR FASCIITIS, BILATERAL   . PONV (postoperative  nausea and vomiting)   . SUPRAVENTRICULAR TACHYCARDIA   . Urge incontinence    Past Surgical History:  Procedure Laterality Date  . Adenosine Myoview  03/14/05  . Cather ablation  04/2007  . EYE SURGERY Bilateral 10-2010   cataracts with lens replacments  . HERNIA REPAIR  15 yrs ago  . NECK LIFT  05/2014   plastic  surgery for a neck lift  . VAGINAL HYSTERECTOMY  yrs ago   Family History  Problem Relation Age of Onset  . Arthritis Mother   . Hypertension Mother   . Coronary artery disease Father   . Heart disease Father   . Breast cancer Maternal Grandmother   . Stroke Paternal Grandmother   . Coronary artery disease Paternal Grandfather   . Stomach cancer Neg Hx   . Colon cancer Neg Hx    Social History   Socioeconomic History  . Marital status: Married    Spouse name: Not on file  . Number of children: Not on file  . Years of education: Not on file  . Highest education level: Not on file  Occupational History  . Occupation: Environmental health practitioner: Tax inspector  Social Needs  . Financial resource strain: Not hard at all  . Food insecurity:    Worry: Never true    Inability: Never true  . Transportation needs:    Medical: No    Non-medical: No  Tobacco Use  . Smoking  status: Never Smoker  . Smokeless tobacco: Never Used  Substance and Sexual Activity  . Alcohol use: Yes    Alcohol/week: 0.0 standard drinks    Comment: red Viren Lebeau 4 times per week  . Drug use: No  . Sexual activity: Not Currently    Partners: Male  Lifestyle  . Physical activity:    Days per week: 0 days    Minutes per session: 0 min  . Stress: To some extent  Relationships  . Social connections:    Talks on phone: More than three times a week    Gets together: More than three times a week    Attends religious service: 1 to 4 times per year    Active member of club or organization: Yes    Attends meetings of clubs or organizations: More than 4 times per year    Relationship  status: Married  Other Topics Concern  . Not on file  Social History Narrative  . Not on file    Outpatient Encounter Medications as of 02/03/2019  Medication Sig  . [DISCONTINUED] Cholecalciferol (VITAMIN D) 2000 units CAPS Take by mouth.  . [DISCONTINUED] magnesium 30 MG tablet Take 30 mg by mouth 2 (two) times daily.  Marland Kitchen conjugated estrogens (PREMARIN) vaginal cream Place 1 Applicatorful vaginally every 14 (fourteen) days.   . [DISCONTINUED] b complex vitamins capsule Take 1 capsule by mouth daily.  . [DISCONTINUED] Biotin 1000 MCG tablet Take 1,000 mcg by mouth daily.   . [DISCONTINUED] famotidine (PEPCID) 20 MG tablet Take 1 tablet (20 mg total) by mouth 2 (two) times daily. (Patient not taking: Reported on 02/03/2019)  . [DISCONTINUED] vitamin C (ASCORBIC ACID) 500 MG tablet Take 500 mg by mouth daily.   No facility-administered encounter medications on file as of 02/03/2019.     Activities of Daily Living In your present state of health, do you have any difficulty performing the following activities: 02/03/2019  Hearing? N  Vision? N  Difficulty concentrating or making decisions? N  Walking or climbing stairs? N  Dressing or bathing? N  Doing errands, shopping? N  Preparing Food and eating ? N  Using the Toilet? N  In the past six months, have you accidently leaked urine? N  Do you have problems with loss of bowel control? N  Managing your Medications? N  Managing your Finances? N  Housekeeping or managing your Housekeeping? N  Some recent data might be hidden    Patient Care Team: Hoyt Koch, MD as PCP - General (Internal Medicine) Azucena Fallen, MD as Consulting Physician (Obstetrics and Gynecology)    Assessment:   This is a routine wellness examination for [place]. Physical assessment deferred to PCP.  Exercise Activities and Dietary recommendations Current Exercise Habits: The patient does not participate in regular exercise at present, Exercise  limited by: None identified  Diet (meal preparation, eat out, water intake, caffeinated beverages, dairy products, fruits and vegetables): in general, a "healthy" diet     Reviewed heart healthy diet. Encouraged patient to increase daily water and healthy fluid intake.  Goals    . Exercise 3x per week (30 min per time)     Increase my steps to 7000    . Patient Stated     Maintain current health status. Enjoy life and family.    . Patient Stated     Maintain current health status.        Fall Risk Fall Risk  02/03/2019 01/29/2018 12/30/2016 12/19/2015 12/09/2014  Falls  in the past year? 0 No No No No   Depression Screen PHQ 2/9 Scores 02/03/2019 01/29/2018 12/30/2016 12/19/2015  PHQ - 2 Score 0 0 0 0  PHQ- 9 Score - 1 - -     Cognitive Function MMSE - Mini Mental State Exam 12/30/2016  Orientation to time 5  Orientation to Place 5  Registration 3  Attention/ Calculation 5  Recall 3  Language- name 2 objects 2  Language- repeat 1  Language- follow 3 step command 3  Language- read & follow direction 1  Write a sentence 1  Copy design 1  Total score 30       Ad8 score reviewed for issues:  Issues making decisions: no  Less interest in hobbies / activities: no  Repeats questions, stories (family complaining): no  Trouble using ordinary gadgets (microwave, computer, phone):no  Forgets the month or year: no  Mismanaging finances: no  Remembering appts: no  Daily problems with thinking and/or memory: no Ad8 score is= 0  Immunization History  Administered Date(s) Administered  . Influenza Split 11/13/2011  . Influenza Whole 09/06/2009, 08/25/2012  . Influenza, High Dose Seasonal PF 08/28/2018  . Influenza,inj,Quad PF,6+ Mos 09/16/2013, 12/09/2014, 12/19/2015  . Influenza-Unspecified 08/26/2017  . Pneumococcal Conjugate-13 03/21/2014  . Pneumococcal Polysaccharide-23 12/10/1999, 12/19/2015  . Td 11/25/1998, 09/06/2009  . Zoster 01/23/2010   Screening Tests Health  Maintenance  Topic Date Due  . COLONOSCOPY  10/17/2018  . MAMMOGRAM  04/17/2019  . TETANUS/TDAP  09/07/2019  . INFLUENZA VACCINE  Completed  . DEXA SCAN  Completed  . Hepatitis C Screening  Completed  . PNA vac Low Risk Adult  Completed      Plan:    Reviewed health maintenance screenings with patient today and relevant education, vaccines, and/or referrals were provided.   Continue doing brain stimulating activities (puzzles, reading, adult coloring books, staying active) to keep memory sharp.   Continue to eat heart healthy diet (full of fruits, vegetables, whole grains, lean protein, water--limit salt, fat, and sugar intake) and increase physical activity as tolerated.  I have personally reviewed and noted the following in the patient's chart:   . Medical and social history . Use of alcohol, tobacco or illicit drugs  . Current medications and supplements . Functional ability and status . Nutritional status . Physical activity . Advanced directives . List of other physicians . Vitals . Screenings to include cognitive, depression, and falls . Referrals and appointments  In addition, I have reviewed and discussed with patient certain preventive protocols, quality metrics, and best practice recommendations. A written personalized care plan for preventive services as well as general preventive health recommendations were provided to patient.     Michiel Cowboy, RN  02/03/2019    Medical screening examination/treatment/procedure(s) were performed by non-physician practitioner and as supervising physician I was immediately available for consultation/collaboration. I agree with above. Scarlette Calico, MD

## 2019-02-03 ENCOUNTER — Ambulatory Visit (INDEPENDENT_AMBULATORY_CARE_PROVIDER_SITE_OTHER): Payer: Medicare Other | Admitting: *Deleted

## 2019-02-03 ENCOUNTER — Other Ambulatory Visit: Payer: Self-pay

## 2019-02-03 ENCOUNTER — Encounter: Payer: Self-pay | Admitting: Internal Medicine

## 2019-02-03 VITALS — BP 126/63 | HR 67 | Resp 17 | Ht 64.0 in | Wt 166.0 lb

## 2019-02-03 DIAGNOSIS — Z0000 Encounter for general adult medical examination without abnormal findings: Secondary | ICD-10-CM | POA: Diagnosis not present

## 2019-02-03 NOTE — Patient Instructions (Addendum)
Continue doing brain stimulating activities (puzzles, reading, adult coloring books, staying active) to keep memory sharp.   Continue to eat heart healthy diet (full of fruits, vegetables, whole grains, lean protein, water--limit salt, fat, and sugar intake) and increase physical activity as tolerated.   Sara [name] , Thank you for taking time to come for your Medicare Wellness Visit. I appreciate your ongoing commitment to your health goals. Please review the following plan we discussed and let me know if I can assist you in the future.   These are the goals we discussed: Goals    . Exercise 3x per week (30 min per time)     Increase my steps to 7000    . Patient Stated     Maintain current health status. Enjoy life and family.    . Patient Stated     Maintain current health status.        This is a list of the screening recommended for you and due dates:  Health Maintenance  Topic Date Due  . Colon Cancer Screening  10/17/2018  . Mammogram  04/17/2019  . Tetanus Vaccine  09/07/2019  . Flu Shot  Completed  . DEXA scan (bone density measurement)  Completed  .  Hepatitis C: One time screening is recommended by Center for Disease Control  (CDC) for  adults born from [birth year] through 1965.   Completed  . Pneumonia vaccines  Completed    If you cannot attend class in person, you can still exercise at home. Video taped versions of [facility] classes are shown on [employer] (GTN) at 8 am and 1 pm Mondays through Fridays. You can also purchase a copy of the A[facility]Y DVD by calling [place] (GTN) [employer]. GTN is available on [employer] channel 13 with a digital cable box and on [employer] channel 31. GTN is also available on [employer], channel 99. To view GTN, go to channel 99, press OK, select  [place], then select GTN to start the channel.  Health Maintenance, Female Adopting a healthy lifestyle and getting preventive care can go a long way  to promote health and wellness. Talk with your health care provider about what schedule of regular examinations is right for you. This is a good chance for you to check in with your provider about disease prevention and staying healthy. In between checkups, there are plenty of things you can do on your own. Experts have done a lot of research about which lifestyle changes and preventive measures are most likely to keep you healthy. Ask your health care provider for more information. Weight and diet Eat a healthy diet  Be sure to include plenty of vegetables, fruits, low-fat dairy products, and lean protein.  Do not eat a lot of foods high in solid fats, added sugars, or salt.  Get regular exercise. This is one of the most important things you can do for your health. ? Most adults should exercise for at least 150 minutes each week. The exercise should increase your heart rate and make you sweat (moderate-intensity exercise). ? Most adults should also do strengthening exercises at least twice a week. This is in addition to the moderate-intensity exercise. Maintain a healthy weight  Body mass index (BMI) is a measurement that can be used to identify possible weight problems. It estimates body fat based on height and weight. Your health care provider can help determine your BMI and help you achieve or maintain a healthy weight.  For females  57 years of age and older: ? A BMI below 18.5 is considered underweight. ? A BMI of 18.5 to 24.9 is normal. ? A BMI of 25 to 29.9 is considered overweight. ? A BMI of 30 and above is considered obese. Watch levels of cholesterol and blood lipids  You should start having your blood tested for lipids and cholesterol at 69 years of age, then have this test every 5 years.  You may need to have your cholesterol levels checked more often if: ? Your lipid or cholesterol levels are high. ? You are older than 69 years of age. ? You are at high risk for heart  disease. Cancer screening Lung Cancer  Lung cancer screening is recommended for adults 21-55 years old who are at high risk for lung cancer because of a history of smoking.  A yearly low-dose CT scan of the lungs is recommended for people who: ? Currently smoke. ? Have quit within the past 15 years. ? Have at least a 30-pack-year history of smoking. A pack year is smoking an average of one pack of cigarettes a day for 1 year.  Yearly screening should continue until it has been 15 years since you quit.  Yearly screening should stop if you develop a health problem that would prevent you from having lung cancer treatment. Breast Cancer  Practice breast self-awareness. This means understanding how your breasts normally appear and feel.  It also means doing regular breast self-exams. Let your health care provider know about any changes, no matter how small.  If you are in your 20s or 30s, you should have a clinical breast exam (CBE) by a health care provider every 1-3 years as part of a regular health exam.  If you are 57 or older, have a CBE every year. Also consider having a breast X-ray (mammogram) every year.  If you have a family history of breast cancer, talk to your health care provider about genetic screening.  If you are at high risk for breast cancer, talk to your health care provider about having an MRI and a mammogram every year.  Breast cancer gene (BRCA) assessment is recommended for women who have family members with BRCA-related cancers. BRCA-related cancers include: ? Breast. ? Ovarian. ? Tubal. ? Peritoneal cancers.  Results of the assessment will determine the need for genetic counseling and BRCA1 and BRCA2 testing. Cervical Cancer Your health care provider may recommend that you be screened regularly for cancer of the pelvic organs (ovaries, uterus, and vagina). This screening involves a pelvic examination, including checking for microscopic changes to the surface  of your cervix (Pap test). You may be encouraged to have this screening done every 3 years, beginning at age 71.  For women ages 47-65, health care providers may recommend pelvic exams and Pap testing every 3 years, or they may recommend the Pap and pelvic exam, combined with testing for human papilloma virus (HPV), every 5 years. Some types of HPV increase your risk of cervical cancer. Testing for HPV may also be done on women of any age with unclear Pap test results.  Other health care providers may not recommend any screening for nonpregnant women who are considered low risk for pelvic cancer and who do not have symptoms. Ask your health care provider if a screening pelvic exam is right for you.  If you have had past treatment for cervical cancer or a condition that could lead to cancer, you need Pap tests and screening for cancer for  at least 20 years after your treatment. If Pap tests have been discontinued, your risk factors (such as having a new sexual partner) need to be reassessed to determine if screening should resume. Some women have medical problems that increase the chance of getting cervical cancer. In these cases, your health care provider may recommend more frequent screening and Pap tests. Colorectal Cancer  This type of cancer can be detected and often prevented.  Routine colorectal cancer screening usually begins at 69 years of age and continues through 69 years of age.  Your health care provider may recommend screening at an earlier age if you have risk factors for colon cancer.  Your health care provider may also recommend using home test kits to check for hidden blood in the stool.  A small camera at the end of a tube can be used to examine your colon directly (sigmoidoscopy or colonoscopy). This is done to check for the earliest forms of colorectal cancer.  Routine screening usually begins at age 12.  Direct examination of the colon should be repeated every 5-10 years  through 69 years of age. However, you may need to be screened more often if early forms of precancerous polyps or small growths are found. Skin Cancer  Check your skin from head to toe regularly.  Tell your health care provider about any new moles or changes in moles, especially if there is a change in a mole's shape or color.  Also tell your health care provider if you have a mole that is larger than the size of a pencil eraser.  Always use sunscreen. Apply sunscreen liberally and repeatedly throughout the day.  Protect yourself by wearing long sleeves, pants, a wide-brimmed hat, and sunglasses whenever you are outside. Heart disease, diabetes, and high blood pressure  High blood pressure causes heart disease and increases the risk of stroke. High blood pressure is more likely to develop in: ? People who have blood pressure in the high end of the normal range (130-139/85-89 mm Hg). ? People who are overweight or obese. ? People who are African American.  If you are 72-60 years of age, have your blood pressure checked every 3-5 years. If you are 4 years of age or older, have your blood pressure checked every year. You should have your blood pressure measured twice-once when you are at a hospital or clinic, and once when you are not at a hospital or clinic. Record the average of the two measurements. To check your blood pressure when you are not at a hospital or clinic, you can use: ? An automated blood pressure machine at a pharmacy. ? A home blood pressure monitor.  If you are between 17 years and 82 years old, ask your health care provider if you should take aspirin to prevent strokes.  Have regular diabetes screenings. This involves taking a blood sample to check your fasting blood sugar level. ? If you are at a normal weight and have a low risk for diabetes, have this test once every three years after 69 years of age. ? If you are overweight and have a high risk for diabetes, consider  being tested at a younger age or more often. Preventing infection Hepatitis B  If you have a higher risk for hepatitis B, you should be screened for this virus. You are considered at high risk for hepatitis B if: ? You were born in a country where hepatitis B is common. Ask your health care provider which countries are  considered high risk. ? Your parents were born in a high-risk country, and you have not been immunized against hepatitis B (hepatitis B vaccine). ? You have HIV or AIDS. ? You use needles to inject street drugs. ? You live with someone who has hepatitis B. ? You have had sex with someone who has hepatitis B. ? You get hemodialysis treatment. ? You take certain medicines for conditions, including cancer, organ transplantation, and autoimmune conditions. Hepatitis C  Blood testing is recommended for: ? Everyone born from [birth year] through 1965. ? Anyone with known risk factors for hepatitis C. Sexually transmitted infections (STIs)  You should be screened for sexually transmitted infections (STIs) including gonorrhea and chlamydia if: ? You are sexually active and are younger than 69 years of age. ? You are older than 69 years of age and your health care provider tells you that you are at risk for this type of infection. ? Your sexual activity has changed since you were last screened and you are at an increased risk for chlamydia or gonorrhea. Ask your health care provider if you are at risk.  If you do not have HIV, but are at risk, it may be recommended that you take a prescription medicine daily to prevent HIV infection. This is called pre-exposure prophylaxis (PrEP). You are considered at risk if: ? You are sexually active and do not regularly use condoms or know the HIV status of your partner(s). ? You take drugs by injection. ? You are sexually active with a partner who has HIV. Talk with your health care provider about whether you are at high risk of being infected with  HIV. If you choose to begin PrEP, you should first be tested for HIV. You should then be tested every 3 months for as long as you are taking PrEP. Pregnancy  If you are premenopausal and you may become pregnant, ask your health care provider about preconception counseling.  If you may become pregnant, take 400 to 800 micrograms (mcg) of folic acid every day.  If you want to prevent pregnancy, talk to your health care provider about birth control (contraception). Osteoporosis and menopause  Osteoporosis is a disease in which the bones lose minerals and strength with aging. This can result in serious bone fractures. Your risk for osteoporosis can be identified using a bone density scan.  If you are 103 years of age or older, or if you are at risk for osteoporosis and fractures, ask your health care provider if you should be screened.  Ask your health care provider whether you should take a calcium or vitamin D supplement to lower your risk for osteoporosis.  Menopause may have certain physical symptoms and risks.  Hormone replacement therapy may reduce some of these symptoms and risks. Talk to your health care provider about whether hormone replacement therapy is right for you. Follow these instructions at home:  Schedule regular health, dental, and eye exams.  Stay current with your immunizations.  Do not use any tobacco products including cigarettes, chewing tobacco, or electronic cigarettes.  If you are pregnant, do not drink alcohol.  If you are breastfeeding, limit how much and how often you drink alcohol.  Limit alcohol intake to no more than 1 drink per day for nonpregnant women. One drink equals 12 ounces of beer, 5 ounces of [place], or 1 ounces of hard liquor.  Do not use street drugs.  Do not share needles.  Ask your health care provider for help if you  need support or information about quitting drugs.  Tell your health care provider if you often feel depressed.  Tell  your health care provider if you have ever been abused or do not feel safe at home. This information is not intended to replace advice given to you by your health care provider. Make sure you discuss any questions you have with your health care provider. Document Released: 05/27/2011 Document Revised: 04/18/2016 Document Reviewed: 08/15/2015 Elsevier Interactive Patient Education  2019 [employer].

## 2019-02-08 ENCOUNTER — Telehealth: Payer: Self-pay | Admitting: *Deleted

## 2019-02-08 NOTE — Telephone Encounter (Signed)
Covid-19 travel screening questions  Have you traveled in the last 14 days?  NO OUT OF STATE  If yes where? IN [place]   Do you now or have you had a fever in the last 14 days? NO   Do you have any respiratory symptoms of shortness of breath or cough now or in the last 14 days? NO  Do you have a medical history of Congestive Heart Failure? NO   Do you have a medical history of lung disease? PNEUMONIA 10 YRS AGO BUT NO CURRENT ISSUES  Do you have any family members or close contacts with diagnosed or suspected Covid-19? NO

## 2019-02-09 ENCOUNTER — Other Ambulatory Visit: Payer: Self-pay

## 2019-02-09 ENCOUNTER — Telehealth: Payer: Self-pay | Admitting: *Deleted

## 2019-02-09 ENCOUNTER — Ambulatory Visit (AMBULATORY_SURGERY_CENTER): Payer: Self-pay | Admitting: *Deleted

## 2019-02-09 VITALS — Temp 98.3°F | Ht 65.0 in | Wt 164.0 lb

## 2019-02-09 DIAGNOSIS — Z1211 Encounter for screening for malignant neoplasm of colon: Secondary | ICD-10-CM

## 2019-02-09 MED ORDER — NA SULFATE-K SULFATE-MG SULF 17.5-3.13-1.6 GM/177ML PO SOLN: ORAL | 0 refills | Status: DC

## 2019-02-09 NOTE — Progress Notes (Signed)
Patient denies any allergies to eggs or soy. Patient denies any problems with anesthesia/sedation. Patient denies any oxygen use at home. Patient denies taking any diet/weight loss medications or blood thinners.  

## 2019-02-09 NOTE — Telephone Encounter (Signed)
Ok to proceed. 

## 2019-02-09 NOTE — Telephone Encounter (Signed)
Patient had her Pre-visit today for upcoming screening colonoscopy on 02/23/2019 with you. Per pt she is having diarrhea for the past 3-4 months about 2-3 times per week, pt denies seeing blood,no fever,no pain, no other symptoms. She has been taking Imodium about once per month as needed. Patient feels this diarrhea maybe diet related. Ok to proceed with colonoscopy? Please advise. Thank you, Robbin pv

## 2019-02-09 NOTE — Telephone Encounter (Signed)
Noted! Thank you

## 2019-02-23 ENCOUNTER — Encounter: Payer: Medicare Other | Admitting: Internal Medicine

## 2019-03-12 ENCOUNTER — Ambulatory Visit: Payer: Medicare Other | Admitting: Neurology

## 2019-03-16 ENCOUNTER — Encounter: Payer: Self-pay | Admitting: Internal Medicine

## 2019-05-03 ENCOUNTER — Ambulatory Visit: Payer: Medicare Other | Admitting: Neurology

## 2019-05-25 ENCOUNTER — Telehealth: Payer: Self-pay | Admitting: *Deleted

## 2019-05-25 ENCOUNTER — Ambulatory Visit: Payer: Self-pay | Admitting: *Deleted

## 2019-05-25 DIAGNOSIS — Z20822 Contact with and (suspected) exposure to covid-19: Secondary | ICD-10-CM

## 2019-05-25 NOTE — Telephone Encounter (Signed)
Please advise if pt should have a virtual visit to be tested.

## 2019-05-25 NOTE — Telephone Encounter (Signed)
Can be tested, due to age and exposure to known positive.

## 2019-05-25 NOTE — Telephone Encounter (Signed)
Pt scheduled for covid testing 05/26/19 @ 12:45 @ [facility]. Instructions given and order placed

## 2019-05-25 NOTE — Telephone Encounter (Signed)
Pt called regarding exposure to her grandson on Father's Day.  He recently tested positive for the virus. She was about 10 feet from him and stated she did not touch anything that he touched. She has denied having symptoms. Husband has a virtual appointment today with his provider regarding testing also and she woud like to be tested. Notified [facility] for an appointment. Advised pt that I would send the triage note to the office for review. She voiced understanding and that she would receive a call back. Reason for Disposition . COVID-19 Testing, questions about  Answer Assessment - Initial Assessment Questions 1. CLOSE CONTACT: "Who is the person with the confirmed or suspected COVID-19 infection that you were exposed to?"     grandson 2. PLACE of CONTACT: "Where were you when you were exposed to COVID-19?" (e.g., home, school, medical waiting room; which city?)     At daughter's house outside 3. TYPE of CONTACT: "How much contact was there?" (e.g., sitting next to, live in same house, work in same office, same building)     Sitting 10 feet apart from each other 4. DURATION of CONTACT: "How long were you in contact with the COVID-19 patient?" (e.g., a few seconds, passed by person, a few minutes, live with the patient)     About an hour an half 5. DATE of CONTACT: "When did you have contact with a COVID-19 patient?" (e.g., how many days ago)     This was June 21 st 6. TRAVEL: "Have you traveled out of the country recently?" If so, "When and where?"     * Also ask about out-of-state travel, since the CDC has identified some high-risk cities for community spread in the [place].     * Note: Travel becomes less relevant if there is widespread community transmission where the patient lives.     no 7. COMMUNITY SPREAD: "Are there lots of cases of COVID-19 (community spread) where you live?" (See public health department website, if unsure)       In the community 8. SYMPTOMS: "Do you have any  symptoms?" (e.g., fever, cough, breathing difficulty)     No  9. PREGNANCY OR POSTPARTUM: "Is there any chance you are pregnant?" "When was your last menstrual period?" "Did you deliver in the last 2 weeks?"     no 10. HIGH RISK: "Do you have any heart or lung problems? Do you have a weak immune system?" (e.g., CHF, COPD, asthma, HIV positive, chemotherapy, renal failure, diabetes mellitus, sickle cell anemia)       no  Protocols used: CORONAVIRUS (COVID-19) EXPOSURE-A-AH

## 2019-05-26 ENCOUNTER — Other Ambulatory Visit: Payer: Self-pay

## 2019-05-26 DIAGNOSIS — Z20822 Contact with and (suspected) exposure to covid-19: Secondary | ICD-10-CM

## 2019-05-26 DIAGNOSIS — R6889 Other general symptoms and signs: Secondary | ICD-10-CM | POA: Diagnosis not present

## 2019-06-01 LAB — NOVEL CORONAVIRUS, NAA: SARS-CoV-2, NAA: NOT DETECTED

## 2019-06-14 DIAGNOSIS — N8111 Cystocele, midline: Secondary | ICD-10-CM | POA: Diagnosis not present

## 2019-06-14 DIAGNOSIS — R350 Frequency of micturition: Secondary | ICD-10-CM | POA: Diagnosis not present

## 2019-06-14 DIAGNOSIS — N815 Vaginal enterocele: Secondary | ICD-10-CM | POA: Diagnosis not present

## 2019-06-14 DIAGNOSIS — Z1231 Encounter for screening mammogram for malignant neoplasm of breast: Secondary | ICD-10-CM | POA: Diagnosis not present

## 2019-06-14 DIAGNOSIS — Z124 Encounter for screening for malignant neoplasm of cervix: Secondary | ICD-10-CM | POA: Diagnosis not present

## 2019-06-14 DIAGNOSIS — M8580 Other specified disorders of bone density and structure, unspecified site: Secondary | ICD-10-CM | POA: Diagnosis not present

## 2019-10-04 ENCOUNTER — Encounter: Payer: Self-pay | Admitting: Internal Medicine

## 2019-10-11 ENCOUNTER — Ambulatory Visit (AMBULATORY_SURGERY_CENTER): Payer: Medicare Other | Admitting: *Deleted

## 2019-10-11 ENCOUNTER — Other Ambulatory Visit: Payer: Self-pay

## 2019-10-11 VITALS — Temp 97.0°F | Ht 65.0 in | Wt 167.4 lb

## 2019-10-11 DIAGNOSIS — Z1159 Encounter for screening for other viral diseases: Secondary | ICD-10-CM

## 2019-10-11 DIAGNOSIS — Z1211 Encounter for screening for malignant neoplasm of colon: Secondary | ICD-10-CM

## 2019-10-11 NOTE — Progress Notes (Signed)
No egg or soy allergy known to patient  No issues with past sedation with any surgeries  or procedures, no intubation problems  No diet pills per patient No home 02 use per patient  No blood thinners per patient  Pt denies issues with constipation  No A fib or A flutter  EMMI video sent to pt's e mail   Due to the COVID-19 pandemic we are asking patients to follow these guidelines. Please only bring one care partner. Please be aware that your care partner may wait in the car in the parking lot or if they feel like they will be too hot to wait in the car, they may wait in the lobby on the 4th floor. All care partners are required to wear a mask the entire time (we do not have any that we can provide them), they need to practice social distancing, and we will do a Covid check for all patient's and care partners when you arrive. Also we will check their temperature and your temperature. If the care partner waits in their car they need to stay in the parking lot the entire time and we will call them on their cell phone when the patient is ready for discharge so they can bring the car to the front of the building. Also all patient's will need to wear a mask into building.  covid screening 10/20/19,1:40 pm  Pt. Had suprep with her at pre-visit

## 2019-10-20 ENCOUNTER — Other Ambulatory Visit: Payer: Self-pay | Admitting: Internal Medicine

## 2019-10-22 LAB — SARS CORONAVIRUS 2 (TAT 6-24 HRS): SARS Coronavirus 2: NEGATIVE

## 2019-10-25 ENCOUNTER — Ambulatory Visit (AMBULATORY_SURGERY_CENTER): Payer: Medicare Other | Admitting: Internal Medicine

## 2019-10-25 ENCOUNTER — Encounter: Payer: Self-pay | Admitting: Internal Medicine

## 2019-10-25 ENCOUNTER — Other Ambulatory Visit: Payer: Self-pay

## 2019-10-25 VITALS — BP 127/52 | HR 60 | Temp 97.9°F | Resp 11 | Ht 65.0 in | Wt 167.0 lb

## 2019-10-25 DIAGNOSIS — K635 Polyp of colon: Secondary | ICD-10-CM | POA: Diagnosis not present

## 2019-10-25 DIAGNOSIS — E669 Obesity, unspecified: Secondary | ICD-10-CM | POA: Diagnosis not present

## 2019-10-25 DIAGNOSIS — D120 Benign neoplasm of cecum: Secondary | ICD-10-CM | POA: Diagnosis not present

## 2019-10-25 DIAGNOSIS — Z1211 Encounter for screening for malignant neoplasm of colon: Secondary | ICD-10-CM | POA: Diagnosis not present

## 2019-10-25 DIAGNOSIS — D125 Benign neoplasm of sigmoid colon: Secondary | ICD-10-CM

## 2019-10-25 MED ORDER — SODIUM CHLORIDE 0.9 % IV SOLN: 500.0000 mL | Freq: Once | INTRAVENOUS | Status: DC

## 2019-10-25 NOTE — Op Note (Signed)
[facility] Patient Name: Sara [name] Procedure Date: 10/25/2019 8:37 AM MRN: [id number] Endoscopist: Jerene Bears , MD Age: 68 Referring MD:  Date of Birth: 08-05-1951 Gender: Female Account #: [id number] Procedure:                Colonoscopy Indications:              Screening for colorectal malignant neoplasm, Last                            colonoscopy: 2009 Medicines:                Monitored Anesthesia Care Procedure:                Pre-Anesthesia Assessment:                           - Prior to the procedure, a History and Physical                            was performed, and patient medications and                            allergies were reviewed. The patient's tolerance of                            previous anesthesia was also reviewed. The risks                            and benefits of the procedure and the sedation                            options and risks were discussed with the patient.                            All questions were answered, and informed consent                            was obtained. Prior Anticoagulants: The patient has                            taken no previous anticoagulant or antiplatelet                            agents. ASA Grade Assessment: II - A patient with                            mild systemic disease. After reviewing the risks                            and benefits, the patient was deemed in                            satisfactory condition to undergo the procedure.  After obtaining informed consent, the colonoscope                            was passed under direct vision. Throughout the                            procedure, the patient's blood pressure, pulse, and                            oxygen saturations were monitored continuously. The                            Colonoscope was introduced through the anus and                            advanced to the cecum, identified by  appendiceal                            orifice and ileocecal valve. The colonoscopy was                            performed without difficulty. The patient tolerated                            the procedure well. The quality of the bowel                            preparation was good. The ileocecal valve,                            appendiceal orifice, and rectum were photographed. Scope In: 8:40:36 AM Scope Out: 8:55:09 AM Scope Withdrawal Time: 0 hours 12 minutes 1 second  Total Procedure Duration: 0 hours 14 minutes 33 seconds  Findings:                 The digital rectal exam was normal.                           A 6 mm polyp was found in the cecum. The polyp was                            sessile. The polyp was removed with a cold snare.                            Resection and retrieval were complete.                           A 6 mm polyp was found in the sigmoid colon. The                            polyp was sessile. The polyp was removed with a  cold snare. Resection and retrieval were complete.                           The exam was otherwise without abnormality on                            direct and retroflexion views. Complications:            No immediate complications. Estimated Blood Loss:     Estimated blood loss was minimal. Impression:               - One 6 mm polyp in the cecum, removed with a cold                            snare. Resected and retrieved.                           - One 6 mm polyp in the sigmoid colon, removed with                            a cold snare. Resected and retrieved.                           - The examination was otherwise normal on direct                            and retroflexion views. Recommendation:           - Patient has a contact number available for                            emergencies. The signs and symptoms of potential                            delayed complications were discussed with the                             patient. Return to normal activities tomorrow.                            Written discharge instructions were provided to the                            patient.                           - Resume previous diet.                           - Continue present medications.                           - Await pathology results.                           - Repeat colonoscopy is recommended. The  colonoscopy date will be determined after pathology                            results from today's exam become available for                            review. Jerene Bears, MD 10/25/2019 8:57:51 AM This report has been signed electronically.

## 2019-10-25 NOTE — Patient Instructions (Signed)
HANDOUTS PROVIDED ON: POLYPS  THE POLYPS TAKEN TODAY HAVE BEEN SENT FOR PATHOLOGY.  THE RESULTS CAN TAKE 2-3 WEEKS TO RECEIVE.  BASED ON THE RESULTS IS WHEN YOUR NEXT COLONOSCOPY WILL BE RECOMMENDED.  YOU MAY RESUME YOUR PREVIOUS DIET AND MEDICATION SCHEDULE.  Kiefer YOU FOR ALLOWING [place] TO CARE FOR YOU TODAY!!!  YOU HAD AN ENDOSCOPIC PROCEDURE TODAY AT [facility]:   Refer to the procedure report that was given to you for any specific questions about what was found during the examination.  If the procedure report does not answer your questions, please call your gastroenterologist to clarify.  If you requested that your care partner not be given the details of your procedure findings, then the procedure report has been included in a sealed envelope for you to review at your convenience later.  YOU SHOULD EXPECT: Some feelings of bloating in the abdomen. Passage of more gas than usual.  Walking can help get rid of the air that was put into your GI tract during the procedure and reduce the bloating. If you had a lower endoscopy (such as a colonoscopy or flexible sigmoidoscopy) you may notice spotting of blood in your stool or on the toilet paper. If you underwent a bowel prep for your procedure, you may not have a normal bowel movement for a few days.  Please Note:  You might notice some irritation and congestion in your nose or some drainage.  This is from the oxygen used during your procedure.  There is no need for concern and it should clear up in a day or so.  SYMPTOMS TO REPORT IMMEDIATELY:   Following lower endoscopy (colonoscopy or flexible sigmoidoscopy):  Excessive amounts of blood in the stool  Significant tenderness or worsening of abdominal pains  Swelling of the abdomen that is new, acute  Fever of 100F or higher  For urgent or emergent issues, a gastroenterologist can be reached at any hour by calling [phone].   DIET:  We do recommend a small meal at  first, but then you may proceed to your regular diet.  Drink plenty of fluids but you should avoid alcoholic beverages for 24 hours.  ACTIVITY:  You should plan to take it easy for the rest of today and you should NOT DRIVE or use heavy machinery until tomorrow (because of the sedation medicines used during the test).    FOLLOW UP: Our staff will call the number listed on your records 48-72 hours following your procedure to check on you and address any questions or concerns that you may have regarding the information given to you following your procedure. If we do not reach you, we will leave a message.  We will attempt to reach you two times.  During this call, we will ask if you have developed any symptoms of COVID 19. If you develop any symptoms (ie: fever, flu-like symptoms, shortness of breath, cough etc.) before then, please call [phone].  If you test positive for Covid 19 in the 2 weeks post procedure, please call and report this information to [place].    If any biopsies were taken you will be contacted by phone or by letter within the next 1-3 weeks.  Please call us at [phone] if you have not heard about the biopsies in 3 weeks.    SIGNATURES/CONFIDENTIALITY: You and/or your care partner have signed paperwork which will be entered into your electronic medical record.  These signatures attest to the fact that that  the information above on your After Visit Summary has been reviewed and is understood.  Full responsibility of the confidentiality of this discharge information lies with you and/or your care-partner.

## 2019-10-25 NOTE — Progress Notes (Signed)
Pt's states no medical or surgical changes since previsit or office visit.  Temp by JB. VS by CW.

## 2019-10-25 NOTE — Progress Notes (Signed)
Report to PACU, RN, vss, BBS= Clear.  

## 2019-10-25 NOTE — Progress Notes (Signed)
Called to room to assist during endoscopic procedure.  Patient ID and intended procedure confirmed with present staff. Received instructions for my participation in the procedure from the performing physician.  

## 2019-10-27 ENCOUNTER — Telehealth: Payer: Self-pay

## 2019-10-27 NOTE — Telephone Encounter (Signed)
  Follow up Call-  Call back number 10/25/2019  Post procedure Call Back phone  # [phone]  Permission to leave phone message Yes  Some recent data might be hidden     Patient questions:  Do you have a fever, pain , or abdominal swelling? No. Pain Score  0 *  Have you tolerated food without any problems? Yes.    Have you been able to return to your normal activities? Yes.    Do you have any questions about your discharge instructions: Diet   No. Medications  No. Follow up visit  No.  Do you have questions or concerns about your Care? No.  Actions: * If pain score is 4 or above: No action needed, pain <4.  1. Have you developed a fever since your procedure? no  2.   Have you had an respiratory symptoms (SOB or cough) since your procedure? no  3.   Have you tested positive for COVID 19 since your procedure no  4.   Have you had any family members/close contacts diagnosed with the COVID 19 since your procedure?  no   If yes to any of these questions please route to Joylene John, RN and Alphonsa Gin, Therapist, sports.

## 2019-10-31 ENCOUNTER — Encounter: Payer: Self-pay | Admitting: Internal Medicine

## 2019-11-08 DIAGNOSIS — Z23 Encounter for immunization: Secondary | ICD-10-CM | POA: Diagnosis not present

## 2019-12-06 ENCOUNTER — Ambulatory Visit: Payer: Medicare Other | Attending: Internal Medicine

## 2019-12-06 DIAGNOSIS — Z23 Encounter for immunization: Secondary | ICD-10-CM

## 2019-12-06 NOTE — Progress Notes (Signed)
   Covid-19 Vaccination Clinic  Name:  Sara [name]    MRN: [id number] DOB: June 21, 1951  12/06/2019  [name] was observed post Covid-19 immunization for 15 minutes without incidence. She was provided with Vaccine Information Sheet and instruction to access the V-Safe system.   [name] was instructed to call 911 with any severe reactions post vaccine: Marland Kitchen Difficulty breathing  . Swelling of your face and throat  . A fast heartbeat  . A bad rash all over your body  . Dizziness and weakness

## 2019-12-27 ENCOUNTER — Ambulatory Visit: Payer: PPO | Attending: Internal Medicine

## 2019-12-27 ENCOUNTER — Ambulatory Visit: Payer: No Typology Code available for payment source

## 2019-12-27 DIAGNOSIS — Z23 Encounter for immunization: Secondary | ICD-10-CM | POA: Insufficient documentation

## 2019-12-27 NOTE — Progress Notes (Signed)
   Covid-19 Vaccination Clinic  Name:  Sara [name]    MRN: [id number] DOB: 02-13-1951  12/27/2019  Sara [name] was observed post Covid-19 immunization for 15 minutes without incidence. She was provided with Vaccine Information Sheet and instruction to access the V-Safe system.   Sara [name] was instructed to call 911 with any severe reactions post vaccine: Marland Kitchen Difficulty breathing  . Swelling of your face and throat  . A fast heartbeat  . A bad rash all over your body  . Dizziness and weakness    Immunizations Administered    Name Date Dose VIS Date Route   Pfizer COVID-19 Vaccine 12/27/2019  8:56 AM 0.3 mL 11/05/2019 Intramuscular   Manufacturer: [place]   Lot: [id number]   [place]: [id number]

## 2020-02-03 ENCOUNTER — Ambulatory Visit: Payer: Medicare Other

## 2020-02-23 ENCOUNTER — Ambulatory Visit: Payer: No Typology Code available for payment source

## 2020-03-15 ENCOUNTER — Ambulatory Visit (INDEPENDENT_AMBULATORY_CARE_PROVIDER_SITE_OTHER): Payer: PPO

## 2020-03-15 ENCOUNTER — Other Ambulatory Visit: Payer: Self-pay

## 2020-03-15 VITALS — BP 122/80 | HR 56 | Temp 98.2°F | Resp 16 | Ht 65.0 in | Wt 163.2 lb

## 2020-03-15 DIAGNOSIS — Z0000 Encounter for general adult medical examination without abnormal findings: Secondary | ICD-10-CM

## 2020-03-15 NOTE — Progress Notes (Signed)
Subjective:   Sara [name] is a 69 y.o. female who presents for Medicare Annual (Subsequent) preventive examination.  Review of Systems:  No ROS. Medicare Wellness Visit Cardiac Risk Factors include: family history of premature cardiovascular disease;advanced age (>44men, >51 women)  Sleep Patterns: No issues with falling sleep; feels rested on waking; gets up 1 time nightly to void and sleeps 7.5 hours nightly. Home Safety/Smoke Alarms: Feels safe in home; Smoke alarms in place. Living environment: Lives in a 1-story home with loft area; lives with husband.  No need for DME; good support system. Seat Belt Safety/Bike Helmet: Wears seat belt.    Objective:     Vitals: BP 122/80 (BP Location: Right Arm, Patient Position: Sitting, Cuff Size: Normal)   Pulse (!) 56   Temp 98.2 F (36.8 C)   Resp 16   Ht 5\' 5"  (1.651 m)   Wt 163 lb 3.2 oz (74 kg)   SpO2 97%   BMI 27.16 kg/m   Body mass index is 27.16 kg/m.  Advanced Directives 03/15/2020 10/25/2019 02/03/2019 02/24/2018 01/29/2018 12/30/2016 10/12/2013  Does Patient Have a Medical Advance Directive? Yes Yes Yes Yes Yes Yes Patient has advance directive, copy not in chart  Type of Advance Directive [place];Living will [place];Living will [place];Living will Living will [place];Living will [place];Living will [place];Living will  Does patient want to make changes to medical advance directive? No - Patient declined No - Patient declined - No - Patient declined - - No  Copy of Healthcare Power of Attorney in Chart? No - copy requested No - copy requested No - copy requested - No - copy requested No - copy requested Copy requested from family  Pre-existing out of facility DNR order (yellow form or pink MOST form) - - - - - - -    Tobacco Social History   Tobacco Use  Smoking Status Never Smoker  Smokeless  Tobacco Never Used     Counseling given: No   Clinical Intake:  Pre-visit preparation completed: Yes  Pain : No/denies pain Pain Score: 0-No pain     BMI - recorded: 27.2 Nutritional Status: BMI 25 -29 Overweight Nutritional Risks: Nausea/ vomitting/ diarrhea Diabetes: No  How often do you need to have someone help you when you read instructions, pamphlets, or other written materials from your doctor or pharmacy?: 1 - Never What is the last grade level you completed in school?: Associate's Degree  Interpreter Needed?: No  Information entered by :: Ilya Ess N. [place]ell Guitar, LPN  Past Medical History:  Diagnosis Date  . Anemia    as child  . ANXIETY   . Cataract    bilateral-removed  . Esophageal stricture   . GERD (gastroesophageal reflux disease)   . PLANTAR FASCIITIS, BILATERAL   . PONV (postoperative nausea and vomiting)   . SUPRAVENTRICULAR TACHYCARDIA   . Urge incontinence    Past Surgical History:  Procedure Laterality Date  . Adenosine Myoview  03/14/05  . Cather ablation  04/2007  . EYE SURGERY Bilateral 10-2010   cataracts with lens replacments  . HERNIA REPAIR  15 yrs ago  . NECK LIFT  05/2014   plastic  surgery for a neck lift  . VAGINAL HYSTERECTOMY  yrs ago   Family History  Problem Relation Age of Onset  . Arthritis Mother   . Hypertension Mother   . Coronary artery disease Father   .  Heart disease Father   . Breast cancer Maternal Grandmother   . Stroke Paternal Grandmother   . Coronary artery disease Paternal Grandfather   . Stomach cancer Neg Hx   . Colon cancer Neg Hx   . Colon polyps Neg Hx   . Esophageal cancer Neg Hx   . Rectal cancer Neg Hx    Social History   Socioeconomic History  . Marital status: Married    Spouse name: Not on file  . Number of children: Not on file  . Years of education: Not on file  . Highest education level: Not on file  Occupational History  . Occupation: Environmental health practitioner: [employer]   Tobacco Use  . Smoking status: Never Smoker  . Smokeless tobacco: Never Used  Substance and Sexual Activity  . Alcohol use: Yes    Alcohol/week: 5.0 standard drinks    Types: 5 Glasses of wine per week    Comment: red wine 5 times per week  . Drug use: No  . Sexual activity: Not Currently    Partners: Male  Other Topics Concern  . Not on file  Social History Narrative  . Not on file   Social Determinants of Health   Financial Resource Strain:   . Difficulty of Paying Living Expenses:   Food Insecurity:   . Worried About Charity fundraiser in the Last Year:   . Arboriculturist in the Last Year:   Transportation Needs:   . Film/video editor (Medical):   Marland Kitchen Lack of Transportation (Non-Medical):   Physical Activity:   . Days of Exercise per Week:   . Minutes of Exercise per Session:   Stress:   . Feeling of Stress :   Social Connections:   . Frequency of Communication with Friends and Family:   . Frequency of Social Gatherings with Friends and Family:   . Attends Religious Services:   . Active Member of Clubs or Organizations:   . Attends Archivist Meetings:   Marland Kitchen Marital Status:     Outpatient Encounter Medications as of 03/15/2020  Medication Sig  . conjugated estrogens (PREMARIN) vaginal cream Place 1 Applicatorful vaginally every 14 (fourteen) days.   . Multiple Vitamin (MULTIVITAMIN) tablet Take 1 tablet by mouth daily.  . Cholecalciferol (VITAMIN D3 PO) Take 2,000 mg by mouth daily.   No facility-administered encounter medications on file as of 03/15/2020.    Activities of Daily Living In your present state of health, do you have any difficulty performing the following activities: 03/15/2020  Hearing? N  Vision? N  Difficulty concentrating or making decisions? N  Walking or climbing stairs? N  Dressing or bathing? N  Doing errands, shopping? N  Preparing Food and eating ? N  Using the Toilet? N  In the past six months, have you accidently  leaked urine? Y  Do you have problems with loss of bowel control? Y  Managing your Medications? N  Managing your Finances? N  Housekeeping or managing your Housekeeping? N  Some recent data might be hidden    Patient Care Team: Hoyt Koch, MD as PCP - General (Internal Medicine) Azucena Fallen, MD as Consulting Physician (Obstetrics and Gynecology)    Assessment:   This is a routine wellness examination for [place].  Exercise Activities and Dietary recommendations Current Exercise Habits: The patient does not participate in regular exercise at present, Exercise limited by: None identified  Goals    . Client  understands the importance of follow-up with providers by attending scheduled visits    . Exercise 3x per week (30 min per time)     Increase my steps to 7000    . Patient Stated     Maintain current health status. Enjoy life and family.    . Patient Stated     Maintain current health status.     . Patient Stated     To lose 10-15 pounds and be more active.  I walk 5,000 steps daily.       Fall Risk Fall Risk  03/15/2020 02/03/2019 01/29/2018 12/30/2016 12/19/2015  Falls in the past year? 0 0 No No No  Number falls in past yr: 0 - - - -  Injury with Fall? 0 - - - -  Risk for fall due to : No Fall Risks - - - -  Follow up Education provided;Falls evaluation completed - - - -   Is the patient's home free of loose throw rugs in walkways, pet beds, electrical cords, etc?   yes      Grab bars in the bathroom? yes      Handrails on the stairs?   yes      Adequate lighting?   yes  Depression Screen PHQ 2/9 Scores 03/15/2020 02/03/2019 01/29/2018 12/30/2016  PHQ - 2 Score 0 0 0 0  PHQ- 9 Score - - 1 -     Cognitive Function MMSE - Mini Mental State Exam 12/30/2016  Orientation to time 5  Orientation to Place 5  Registration 3  Attention/ Calculation 5  Recall 3  Language- name 2 objects 2  Language- repeat 1  Language- follow 3 step command 3  Language- read &  follow direction 1  Write a sentence 1  Copy design 1  Total score 30        Immunization History  Administered Date(s) Administered  . Influenza Split 11/13/2011  . Influenza Whole 09/06/2009, 08/25/2012  . Influenza, High Dose Seasonal PF 08/28/2018  . Influenza,inj,Quad PF,6+ Mos 09/16/2013, 12/09/2014, 12/19/2015  . Influenza-Unspecified 08/26/2017  . PFIZER SARS-COV-2 Vaccination 12/06/2019, 12/27/2019  . Pneumococcal Conjugate-13 03/21/2014  . Pneumococcal Polysaccharide-23 12/10/1999, 12/19/2015  . Td 11/25/1998, 09/06/2009  . Zoster 01/23/2010    Qualifies for Shingles Vaccine? Yes  Screening Tests Health Maintenance  Topic Date Due  . MAMMOGRAM  04/17/2019  . TETANUS/TDAP  09/07/2019  . INFLUENZA VACCINE  06/25/2020  . COLONOSCOPY  10/24/2024  . DEXA SCAN  Completed  . COVID-19 Vaccine  Completed  . Hepatitis C Screening  Completed  . PNA vac Low Risk Adult  Completed    Cancer Screenings: Lung: Low Dose CT Chest recommended if Age 30-80 years, 30 pack-year currently smoking OR have quit w/in 15years. Patient does not qualify. Breast:  Up to date on Mammogram? Yes   Up to date of Bone Density/Dexa? Yes Colorectal: Yes     Plan:  Reviewed health maintenance screenings with patient today and relevant education, vaccines, and/or referrals were provided.    Continue doing brain stimulating activities (puzzles, reading, adult coloring books, staying active) to keep memory sharp.    Continue to eat heart healthy diet (full of fruits, vegetables, whole grains, lean protein, water--limit salt, fat, and sugar intake) and increase physical activity as tolerated.  I have personally reviewed and noted the following in the patient's chart:   . Medical and social history . Use of alcohol, tobacco or illicit drugs  . Current medications and supplements .  Functional ability and status . Nutritional status . Physical activity . Advanced directives . List of other  physicians . Hospitalizations, surgeries, and ER visits in previous 12 months . Vitals . Screenings to include cognitive, depression, and falls . Referrals and appointments  In addition, I have reviewed and discussed with patient certain preventive protocols, quality metrics, and best practice recommendations. A written personalized care plan for preventive services as well as general preventive health recommendations were provided to patient.     Sheral Flow, LPN  [id number]  Nurse Health Advisor

## 2020-03-15 NOTE — Patient Instructions (Addendum)
Sara [name] , Thank you for taking time to come for your Medicare Wellness Visit. I appreciate your ongoing commitment to your health goals. Please review the following plan we discussed and let me know if I can assist you in the future.   Screening recommendations/referrals: Colorectal Screening: 10/25/2019 Mammogram: 06/14/2019  Vision and Dental Exams: Recommended annual ophthalmology exams for early detection of glaucoma and other disorders of the eye Recommended annual dental exams for proper oral hygiene  Vaccinations: Influenza vaccine: 11/08/2019 Pneumococcal vaccine: completed; Prevnar 03/21/2014 and Pneumovax 12/19/2015 Tdap vaccine: 09/06/2009; due every 10 years Shingles vaccine: Please call your insurance company to determine your out of pocket expense for the Shingrix vaccine. You may receive this vaccine at your local pharmacy. Covid vaccine: completed; [place] 12/06/2019, 12/27/2019  Advanced directives: Advance directives discussed with you today. Please bring a copy of your POA (Power of [place]) and/or Living Will to your next appointment.  Goals:  Recommend to drink at least 6-8 8oz glasses of water per day.  Recommend to exercise for at least 150 minutes per week.  Recommend to remove any items from the home that may cause slips or trips.  Recommend to decrease portion sizes by eating 3 small healthy meals and at least 2 healthy snacks per day.  Recommend to begin DASH diet as directed below  Recommend to continue efforts to reduce smoking habits until no longer smoking. Smoking Cessation literature is attached below.  Next appointment: Please schedule your Annual Wellness Visit with your Nurse Health Advisor in one year.  Preventive Care 90 Years and Older, Female Preventive care refers to lifestyle choices and visits with your health care provider that can promote health and wellness. What does preventive care include?  A yearly physical exam. This is also called  an annual well check.  Dental exams once or twice a year.  Routine eye exams. Ask your health care provider how often you should have your eyes checked.  Personal lifestyle choices, including:  Daily care of your teeth and gums.  Regular physical activity.  Eating a healthy diet.  Avoiding tobacco and drug use.  Limiting alcohol use.  Practicing safe sex.  Taking low-dose aspirin every day if recommended by your health care provider.  Taking vitamin and mineral supplements as recommended by your health care provider. What happens during an annual well check? The services and screenings done by your health care provider during your annual well check will depend on your age, overall health, lifestyle risk factors, and family history of disease. Counseling  Your health care provider may ask you questions about your:  Alcohol use.  Tobacco use.  Drug use.  Emotional well-being.  Home and relationship well-being.  Sexual activity.  Eating habits.  History of falls.  Memory and ability to understand (cognition).  Work and work Statistician.  Reproductive health. Screening  You may have the following tests or measurements:  Height, weight, and BMI.  Blood pressure.  Lipid and cholesterol levels. These may be checked every 5 years, or more frequently if you are over 42 years old.  Skin check.  Lung cancer screening. You may have this screening every year starting at age 37 if you have a 30-pack-year history of smoking and currently smoke or have quit within the past 15 years.  Fecal occult blood test (FOBT) of the stool. You may have this test every year starting at age 17.  Flexible sigmoidoscopy or colonoscopy. You may have a sigmoidoscopy every 5 years or  a colonoscopy every 10 years starting at age 66.  Hepatitis C blood test.  Hepatitis B blood test.  Sexually transmitted disease (STD) testing.  Diabetes screening. This is done by checking your  blood sugar (glucose) after you have not eaten for a while (fasting). You may have this done every 1-3 years.  Bone density scan. This is done to screen for osteoporosis. You may have this done starting at age 70.  Mammogram. This may be done every 1-2 years. Talk to your health care provider about how often you should have regular mammograms. Talk with your health care provider about your test results, treatment options, and if necessary, the need for more tests. Vaccines  Your health care provider may recommend certain vaccines, such as:  Influenza vaccine. This is recommended every year.  Tetanus, diphtheria, and acellular pertussis (Tdap, Td) vaccine. You may need a Td booster every 10 years.  Zoster vaccine. You may need this after age 65.  Pneumococcal 13-valent conjugate (PCV13) vaccine. One dose is recommended after age 76.  Pneumococcal polysaccharide (PPSV23) vaccine. One dose is recommended after age 57. Talk to your health care provider about which screenings and vaccines you need and how often you need them. This information is not intended to replace advice given to you by your health care provider. Make sure you discuss any questions you have with your health care provider. Document Released: 12/08/2015 Document Revised: 07/31/2016 Document Reviewed: 09/12/2015 Elsevier Interactive Patient Education  2017 [place] Prevention in the Home Falls can cause injuries. They can happen to people of all ages. There are many things you can do to make your home safe and to help prevent falls. What can I do on the outside of my home?  Regularly fix the edges of walkways and driveways and fix any cracks.  Remove anything that might make you trip as you walk through a door, such as a raised step or threshold.  Trim any bushes or trees on the path to your home.  Use bright outdoor lighting.  Clear any walking paths of anything that might make someone trip, such as rocks  or tools.  Regularly check to see if handrails are loose or broken. Make sure that both sides of any steps have handrails.  Any raised decks and porches should have guardrails on the edges.  Have any leaves, snow, or ice cleared regularly.  Use sand or salt on walking paths during winter.  Clean up any spills in your garage right away. This includes oil or grease spills. What can I do in the bathroom?  Use night lights.  Install grab bars by the toilet and in the tub and shower. Do not use towel bars as grab bars.  Use non-skid mats or decals in the tub or shower.  If you need to sit down in the shower, use a plastic, non-slip stool.  Keep the floor dry. Clean up any water that spills on the floor as soon as it happens.  Remove soap buildup in the tub or shower regularly.  Attach bath mats securely with double-sided non-slip rug tape.  Do not have throw rugs and other things on the floor that can make you trip. What can I do in the bedroom?  Use night lights.  Make sure that you have a light by your bed that is easy to reach.  Do not use any sheets or blankets that are too big for your bed. They should not hang down onto  the floor.  Have a firm chair that has side arms. You can use this for support while you get dressed.  Do not have throw rugs and other things on the floor that can make you trip. What can I do in the kitchen?  Clean up any spills right away.  Avoid walking on wet floors.  Keep items that you use a lot in easy-to-reach places.  If you need to reach something above you, use a strong step stool that has a grab bar.  Keep electrical cords out of the way.  Do not use floor polish or wax that makes floors slippery. If you must use wax, use non-skid floor wax.  Do not have throw rugs and other things on the floor that can make you trip. What can I do with my stairs?  Do not leave any items on the stairs.  Make sure that there are handrails on both  sides of the stairs and use them. Fix handrails that are broken or loose. Make sure that handrails are as long as the stairways.  Check any carpeting to make sure that it is firmly attached to the stairs. Fix any carpet that is loose or worn.  Avoid having throw rugs at the top or bottom of the stairs. If you do have throw rugs, attach them to the floor with carpet tape.  Make sure that you have a light switch at the top of the stairs and the bottom of the stairs. If you do not have them, ask someone to add them for you. What else can I do to help prevent falls?  Wear shoes that:  Do not have high heels.  Have rubber bottoms.  Are comfortable and fit you well.  Are closed at the toe. Do not wear sandals.  If you use a stepladder:  Make sure that it is fully opened. Do not climb a closed stepladder.  Make sure that both sides of the stepladder are locked into place.  Ask someone to hold it for you, if possible.  Clearly mark and make sure that you can see:  Any grab bars or handrails.  First and last steps.  Where the edge of each step is.  Use tools that help you move around (mobility aids) if they are needed. These include:  Canes.  Walkers.  Scooters.  Crutches.  Turn on the lights when you go into a dark area. Replace any light bulbs as soon as they burn out.  Set up your furniture so you have a clear path. Avoid moving your furniture around.  If any of your floors are uneven, fix them.  If there are any pets around you, be aware of where they are.  Review your medicines with your doctor. Some medicines can make you feel dizzy. This can increase your chance of falling. Ask your doctor what other things that you can do to help prevent falls. This information is not intended to replace advice given to you by your health care provider. Make sure you discuss any questions you have with your health care provider. Document Released: 09/07/2009 Document Revised:  04/18/2016 Document Reviewed: 12/16/2014 Elsevier Interactive Patient Education  2017 [employer].

## 2020-05-09 ENCOUNTER — Ambulatory Visit: Payer: PPO | Admitting: Nurse Practitioner

## 2020-05-09 ENCOUNTER — Encounter: Payer: Self-pay | Admitting: Nurse Practitioner

## 2020-05-09 VITALS — BP 122/72 | HR 78 | Ht 64.0 in | Wt 163.4 lb

## 2020-05-09 DIAGNOSIS — R195 Other fecal abnormalities: Secondary | ICD-10-CM | POA: Diagnosis not present

## 2020-05-09 NOTE — Patient Instructions (Signed)
If you are age 70 or older, your body mass index should be between 23-30. Your Body mass index is 28.04 kg/m. If this is out of the aforementioned range listed, please consider follow up with your Primary Care Provider.  If you are age 82 or younger, your body mass index should be between 19-25. Your Body mass index is 28.04 kg/m. If this is out of the aformentioned range listed, please consider follow up with your Primary Care Provider.   START Imodium 1 tablet every morning.  Call the office to schedule a follow up With [name], NP-C in 2 months

## 2020-05-09 NOTE — Progress Notes (Signed)
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  hysterectomy   # Chronic loose stool. Possibly IBS --Possibly IBS.  Frequently exacerbated by certain foods. Mild and transient cramping relieved with defecation. --Symptoms date back to 2019.  No blood in stool, no fevers, no associated weight loss. --With exception of 2 polyps there were no abnormalities on screening colonoscopy November 2020 --Trial of imodium, one qam --follow up in 2 months, sooner if needed.    # Hx of colon polyps.  --She is on 5-year recall colonoscopy list for November 2025  HPI:    Primary GI:  Sara Jarred, MD   Chief complaint : loose stool  Sara Sara [name] is a 69 yo Sara [name] with history of daytime loose stool dating back to 2019.  Since 2019 she has been having 3-4 soft to loose BMs a day. No blood in stool. Certain foods such as spinach pie, sausage or fatty food exacerbate symptoms but not on a consistent basis.   She only takes premarin, no other medications. She drinks only two cups of coffee in am and tea at lunch, no other caffeine. Avoiding lactose. Pepto and Gavison offer some improvement. She tied probiotics for a couple of weeks without benefit. Imodium once a week helps the most. She hasn't had any associated weight loss. Frequently gets mild, transient cramps relieved with BM.  About once a month for unclear reasons she has a day of normal BM.    Previous Endoscopic Evaluations: 10/25/19 screening colonoscopy --One 6 mm polyp in the cecum --One 6 mm polyp in the sigmoid colon --The examination was otherwise normal on direct and retroflexion views.+10/25/19 screening colonoscopy  Surgical [P], colon, cecum, polyp - SESSILE SERRATED POLYP(S) WITHOUT CYTOLOGIC DYSPLASIA.   Review of systems:     No chest pain, no SOB, no fevers, no urinary sx   Past Medical  History:  Diagnosis Date  . Anemia    as child  . ANXIETY   . Cataract    bilateral-removed  . Esophageal stricture   . GERD (gastroesophageal reflux disease)   . PLANTAR FASCIITIS, BILATERAL   . PONV (postoperative nausea and vomiting)   . SUPRAVENTRICULAR TACHYCARDIA   . Urge incontinence     Patient's surgical history, family medical history, social history, medications and allergies were all reviewed in Epic   Creatinine clearance cannot be calculated (Patient's most recent lab result is older than the maximum 21 days allowed.)  Current Outpatient Medications  Medication Sig Dispense Refill  . conjugated estrogens (PREMARIN) vaginal cream Place 1 Applicatorful vaginally every 14 (fourteen) days.     . Multiple Vitamin (MULTIVITAMIN) tablet Take 1 tablet by mouth daily.     No current facility-administered medications for this visit.    Physical Exam:     BP 122/72   Pulse 78   Ht 5\' 4"  (1.626 m)   Wt 163 lb 6 oz (74.1 kg)   BMI 28.04 kg/m   GENERAL:  Pleasant Sara [name] in NAD PSYCH: : Cooperative, normal affect CARDIAC:  RRR PULM: Normal respiratory effort, lungs CTA bilaterally, no wheezing ABDOMEN:  Nondistended, soft, nontender. No obvious masses, no hepatomegaly,  normal bowel sounds SKIN:  turgor, no lesions seen Musculoskeletal:  Normal muscle tone, normal strength NEURO: Alert and oriented x 3, no focal neurologic deficits   [name] , NP 05/09/2020, 9:48 AM

## 2020-05-10 NOTE — Progress Notes (Signed)
Addendum: Reviewed and agree with assessment and management plan. Elza Sortor M, MD  

## 2020-08-05 NOTE — Progress Notes (Signed)
Virtual Visit via telephone Note  I connected with Sara [name] on 08/07/20 at  1:15 PM EDT by telephone and verified that I am speaking with the correct person using two identifiers.   I discussed the limitations of evaluation and management by telemedicine and the availability of in person appointments. The patient expressed understanding and agreed to proceed.  Present for the visit:  Myself, Dr Billey Gosling, Landry Dyke.  The patient is currently at home and I am in the office.    No referring provider.    History of Present Illness: This is an acute visit for head congestion, nausea and dizziness.  Her symptoms started a couple of weeks ago.  She states 2-3 weeks ago she was nauseated and dizzy in the morning.  She had mild head congestion.  She took a decongestant and an advil later and she was fine.   The dizziness is a lightheaded feeling that makes her nauseous.  She feels it in the morning or if she sits later in the day and gets up she will also feel it.  Overall it does get better as the day goes on.  Denies fevers.  She feels a little shaky at times.    Her BP was a little high 150/? - now 139/70.  Usually her blood pressure has been well controlled.  O2 97-98   Has been doing some sanding of a piece of furniture.  She wears a mask when this happens so she does not think she is exposed to too much dust.  She has some nasal congestion, but no sinus pain or other respiratory symptoms.  She has noticed some urine odor.  She denies any dysuria, increasing frequency or other urine symptoms.  She has not had a UTI in several years.  She drinks a couple of cups of coffee for breakfast and tea in the afternoon and then something later on.  She does not drink a lot of water throughout the day.   Review of Systems  Constitutional: Negative for fever.  HENT: Positive for congestion. Negative for ear pain, sinus pain and sore throat.   Respiratory: Positive for shortness of  breath (anxiety related). Negative for cough and wheezing.   Gastrointestinal: Positive for nausea. Negative for abdominal pain and diarrhea.  Genitourinary: Negative for dysuria, frequency and hematuria.       Urine smells funky  Musculoskeletal: Negative for myalgias.  Neurological: Positive for dizziness and headaches (dull headache).      Social History   Socioeconomic History  . Marital status: Married    Spouse name: Not on file  . Number of children: Not on file  . Years of education: Not on file  . Highest education level: Not on file  Occupational History  . Occupation: Environmental health practitioner: [employer]  Tobacco Use  . Smoking status: Never Smoker  . Smokeless tobacco: Never Used  Vaping Use  . Vaping Use: Never used  Substance and Sexual Activity  . Alcohol use: Yes    Alcohol/week: 5.0 standard drinks    Types: 5 Glasses of wine per week    Comment: red wine 5 times per week  . Drug use: No  . Sexual activity: Not Currently    Partners: Male  Other Topics Concern  . Not on file  Social History Narrative  . Not on file   Social Determinants of Health   Financial Resource Strain:   . Difficulty of Paying Living Expenses:  Not on file  Food Insecurity:   . Worried About Charity fundraiser in the Last Year: Not on file  . Ran Out of Food in the Last Year: Not on file  Transportation Needs:   . Lack of Transportation (Medical): Not on file  . Lack of Transportation (Non-Medical): Not on file  Physical Activity:   . Days of Exercise per Week: Not on file  . Minutes of Exercise per Session: Not on file  Stress:   . Feeling of Stress : Not on file  Social Connections:   . Frequency of Communication with Friends and Family: Not on file  . Frequency of Social Gatherings with Friends and Family: Not on file  . Attends Religious Services: Not on file  . Active Member of Clubs or Organizations: Not on file  . Attends Archivist Meetings:  Not on file  . Marital Status: Not on file      Assessment and Plan:  See Problem List for Assessment and Plan of chronic medical problems.   Follow Up Instructions:    I discussed the assessment and treatment plan with the patient. The patient was provided an opportunity to ask questions and all were answered. The patient agreed with the plan and demonstrated an understanding of the instructions.   The patient was advised to call back or seek an in-person evaluation if the symptoms worsen or if the condition fails to improve as anticipated.  Time spent on telephone call: 12 minutes  Binnie Rail, MD

## 2020-08-07 ENCOUNTER — Telehealth (INDEPENDENT_AMBULATORY_CARE_PROVIDER_SITE_OTHER): Payer: PPO | Admitting: Internal Medicine

## 2020-08-07 ENCOUNTER — Encounter: Payer: Self-pay | Admitting: Internal Medicine

## 2020-08-07 DIAGNOSIS — R8290 Unspecified abnormal findings in urine: Secondary | ICD-10-CM | POA: Insufficient documentation

## 2020-08-07 DIAGNOSIS — R42 Dizziness and giddiness: Secondary | ICD-10-CM | POA: Diagnosis not present

## 2020-08-07 NOTE — Assessment & Plan Note (Signed)
Acute She has noticed an odor to her urine No other urine symptoms ? Related to dehydration versus possible infection Given her other symptoms it could be either Discussed options She feels well overall so at this point she will try pushing fluids If symptoms do not improve she will need an in person visit for further evaluation

## 2020-08-07 NOTE — Assessment & Plan Note (Signed)
Acute Episodic for 2-3 weeks and associated with some nausea Also reports being shaky at times ?  Dehydration,?  UTI No other concerning symptoms She does not drink very much fluids throughout the day and we discussed options at this point-we are limited because this is a phone call visit She will increase her fluids over the next several days and see if her symptoms improve-she will try to drink a lot of water If her symptoms do not improve she may need to come to the office for an in person evaluation so that we can do blood work and check her urine at that time Continue to monitor BP-blood pressure has been well controlled and she is not on any medication so I do not think it is orthostatic in nature She will call if her symptoms change or worsen

## 2020-08-09 DIAGNOSIS — L281 Prurigo nodularis: Secondary | ICD-10-CM | POA: Diagnosis not present

## 2020-08-09 DIAGNOSIS — L820 Inflamed seborrheic keratosis: Secondary | ICD-10-CM | POA: Diagnosis not present

## 2020-08-09 DIAGNOSIS — L821 Other seborrheic keratosis: Secondary | ICD-10-CM | POA: Diagnosis not present

## 2020-08-11 DIAGNOSIS — N898 Other specified noninflammatory disorders of vagina: Secondary | ICD-10-CM | POA: Diagnosis not present

## 2020-08-11 DIAGNOSIS — N815 Vaginal enterocele: Secondary | ICD-10-CM | POA: Diagnosis not present

## 2020-08-11 DIAGNOSIS — Z1231 Encounter for screening mammogram for malignant neoplasm of breast: Secondary | ICD-10-CM | POA: Diagnosis not present

## 2020-08-11 DIAGNOSIS — Z124 Encounter for screening for malignant neoplasm of cervix: Secondary | ICD-10-CM | POA: Diagnosis not present

## 2020-08-11 DIAGNOSIS — N3946 Mixed incontinence: Secondary | ICD-10-CM | POA: Diagnosis not present

## 2020-08-31 DIAGNOSIS — N8111 Cystocele, midline: Secondary | ICD-10-CM | POA: Diagnosis not present

## 2020-08-31 DIAGNOSIS — N898 Other specified noninflammatory disorders of vagina: Secondary | ICD-10-CM | POA: Diagnosis not present

## 2021-03-15 ENCOUNTER — Ambulatory Visit: Payer: PPO

## 2021-03-26 ENCOUNTER — Encounter: Payer: Self-pay | Admitting: Internal Medicine

## 2021-03-26 ENCOUNTER — Other Ambulatory Visit: Payer: Self-pay

## 2021-03-26 ENCOUNTER — Ambulatory Visit (INDEPENDENT_AMBULATORY_CARE_PROVIDER_SITE_OTHER): Payer: PPO | Admitting: Internal Medicine

## 2021-03-26 VITALS — BP 130/90 | HR 72 | Temp 98.4°F | Resp 18 | Ht 64.0 in | Wt 166.6 lb

## 2021-03-26 DIAGNOSIS — N3941 Urge incontinence: Secondary | ICD-10-CM | POA: Diagnosis not present

## 2021-03-26 DIAGNOSIS — Z0000 Encounter for general adult medical examination without abnormal findings: Secondary | ICD-10-CM

## 2021-03-26 LAB — CBC
HCT: 39.5 % (ref 36.0–46.0)
Hemoglobin: 13.2 g/dL (ref 12.0–15.0)
MCHC: 33.4 g/dL (ref 30.0–36.0)
MCV: 92.5 fl (ref 78.0–100.0)
Platelets: 209 10*3/uL (ref 150.0–400.0)
RBC: 4.27 Mil/uL (ref 3.87–5.11)
RDW: 13 % (ref 11.5–15.5)
WBC: 5.7 10*3/uL (ref 4.0–10.5)

## 2021-03-26 LAB — LIPID PANEL
Cholesterol: 173 mg/dL (ref 0–200)
HDL: 57.7 mg/dL (ref >39.00)
LDL Cholesterol: 87 mg/dL (ref 0–99)
NonHDL: 115.33
Total CHOL/HDL Ratio: 3
Triglycerides: 144 mg/dL (ref 0.0–149.0)
VLDL: 28.8 mg/dL (ref 0.0–40.0)

## 2021-03-26 LAB — COMPREHENSIVE METABOLIC PANEL
ALT: 17 U/L (ref 0–35)
AST: 18 U/L (ref 0–37)
Albumin: 4 g/dL (ref 3.5–5.2)
Alkaline Phosphatase: 71 U/L (ref 39–117)
BUN: 17 mg/dL (ref 6–23)
CO2: 28 mEq/L (ref 19–32)
Calcium: 9.2 mg/dL (ref 8.4–10.5)
Chloride: 106 mEq/L (ref 96–112)
Creatinine, Ser: 0.7 mg/dL (ref 0.40–1.20)
GFR: 87.08 mL/min (ref >60.00)
Glucose, Bld: 86 mg/dL (ref 70–99)
Potassium: 4.4 mEq/L (ref 3.5–5.1)
Sodium: 141 mEq/L (ref 135–145)
Total Bilirubin: 0.5 mg/dL (ref 0.2–1.2)
Total Protein: 6.6 g/dL (ref 6.0–8.3)

## 2021-03-26 LAB — HIGH SENSITIVITY CRP: CRP, High Sensitivity: 1.41 mg/L (ref 0.000–5.000)

## 2021-03-26 LAB — TSH: TSH: 1.29 u[IU]/mL (ref 0.35–4.50)

## 2021-03-26 NOTE — Patient Instructions (Addendum)
Health Maintenance, Female Adopting a healthy lifestyle and getting preventive care are important in promoting health and wellness. Ask your health care provider about:  The right schedule for you to have regular tests and exams.  Things you can do on your own to prevent diseases and keep yourself healthy. What should I know about diet, weight, and exercise? Eat a healthy diet  Eat a diet that includes plenty of vegetables, fruits, low-fat dairy products, and lean protein.  Do not eat a lot of foods that are high in solid fats, added sugars, or sodium.   Maintain a healthy weight Body mass index (BMI) is used to identify weight problems. It estimates body fat based on height and weight. Your health care provider can help determine your BMI and help you achieve or maintain a healthy weight. Get regular exercise Get regular exercise. This is one of the most important things you can do for your health. Most adults should:  Exercise for at least 150 minutes each week. The exercise should increase your heart rate and make you sweat (moderate-intensity exercise).  Do strengthening exercises at least twice a week. This is in addition to the moderate-intensity exercise.  Spend less time sitting. Even light physical activity can be beneficial. Watch cholesterol and blood lipids Have your blood tested for lipids and cholesterol at 71 years of age, then have this test every 5 years. Have your cholesterol levels checked more often if:  Your lipid or cholesterol levels are high.  You are older than 71 years of age.  You are at high risk for heart disease. What should I know about cancer screening? Depending on your health history and family history, you may need to have cancer screening at various ages. This may include screening for:  Breast cancer.  Cervical cancer.  Colorectal cancer.  Skin cancer.  Lung cancer. What should I know about heart disease, diabetes, and high blood  pressure? Blood pressure and heart disease  High blood pressure causes heart disease and increases the risk of stroke. This is more likely to develop in people who have high blood pressure readings, are of African descent, or are overweight.  Have your blood pressure checked: ? Every 3-5 years if you are 18-39 years of age. ? Every year if you are 40 years old or older. Diabetes Have regular diabetes screenings. This checks your fasting blood sugar level. Have the screening done:  Once every three years after age 40 if you are at a normal weight and have a low risk for diabetes.  More often and at a younger age if you are overweight or have a high risk for diabetes. What should I know about preventing infection? Hepatitis B If you have a higher risk for hepatitis B, you should be screened for this virus. Talk with your health care provider to find out if you are at risk for hepatitis B infection. Hepatitis C Testing is recommended for:  Everyone born from 1945 through 1965.  Anyone with known risk factors for hepatitis C. Sexually transmitted infections (STIs)  Get screened for STIs, including gonorrhea and chlamydia, if: ? You are sexually active and are younger than 71 years of age. ? You are older than 71 years of age and your health care provider tells you that you are at risk for this type of infection. ? Your sexual activity has changed since you were last screened, and you are at increased risk for chlamydia or gonorrhea. Ask your health care provider   if you are at risk.  Ask your health care provider about whether you are at high risk for HIV. Your health care provider may recommend a prescription medicine to help prevent HIV infection. If you choose to take medicine to prevent HIV, you should first get tested for HIV. You should then be tested every 3 months for as long as you are taking the medicine. Pregnancy  If you are about to stop having your period (premenopausal) and  you may become pregnant, seek counseling before you get pregnant.  Take 400 to 800 micrograms (mcg) of folic acid every day if you become pregnant.  Ask for birth control (contraception) if you want to prevent pregnancy. Osteoporosis and menopause Osteoporosis is a disease in which the bones lose minerals and strength with aging. This can result in bone fractures. If you are 65 years old or older, or if you are at risk for osteoporosis and fractures, ask your health care provider if you should:  Be screened for bone loss.  Take a calcium or vitamin D supplement to lower your risk of fractures.  Be given hormone replacement therapy (HRT) to treat symptoms of menopause. Follow these instructions at home: Lifestyle  Do not use any products that contain nicotine or tobacco, such as cigarettes, e-cigarettes, and chewing tobacco. If you need help quitting, ask your health care provider.  Do not use street drugs.  Do not share needles.  Ask your health care provider for help if you need support or information about quitting drugs. Alcohol use  Do not drink alcohol if: ? Your health care provider tells you not to drink. ? You are pregnant, may be pregnant, or are planning to become pregnant.  If you drink alcohol: ? Limit how much you use to 0-1 drink a day. ? Limit intake if you are breastfeeding.  Be aware of how much alcohol is in your drink. In the [place] one drink equals one 12 oz bottle of beer (355 mL), one 5 oz glass of wine (148 mL), or one 1 oz glass of hard liquor (44 mL). General instructions  Schedule regular health, dental, and eye exams.  Stay current with your vaccines.  Tell your health care provider if: ? You often feel depressed. ? You have ever been abused or do not feel safe at home. Summary  Adopting a healthy lifestyle and getting preventive care are important in promoting health and wellness.  Follow your health care provider's instructions about healthy  diet, exercising, and getting tested or screened for diseases.  Follow your health care provider's instructions on monitoring your cholesterol and blood pressure. This information is not intended to replace advice given to you by your health care provider. Make sure you discuss any questions you have with your health care provider. Document Revised: 11/04/2018 Document Reviewed: 11/04/2018 Elsevier Patient Education  2021 Elsevier Inc.  

## 2021-03-26 NOTE — Progress Notes (Signed)
   Subjective:   Patient ID: Sara [name], female    DOB: 07-27-1951, 70 y.o.   MRN: [id number]  HPI The patient is a 70 YO female coming in for physical.  PMH, [place], social history reviewed and updated  Review of Systems  Constitutional: Negative.   HENT: Negative.   Eyes: Negative.   Respiratory: Negative for cough, chest tightness and shortness of breath.   Cardiovascular: Negative for chest pain, palpitations and leg swelling.  Gastrointestinal: Negative for abdominal distention, abdominal pain, constipation, diarrhea, nausea and vomiting.  Musculoskeletal: Negative.   Skin: Negative.   Neurological: Negative.   Psychiatric/Behavioral: Negative.     Objective:  Physical Exam Constitutional:      Appearance: She is well-developed.  HENT:     Head: Normocephalic and atraumatic.  Cardiovascular:     Rate and Rhythm: Normal rate and regular rhythm.  Pulmonary:     Effort: Pulmonary effort is normal. No respiratory distress.     Breath sounds: Normal breath sounds. No wheezing or rales.  Abdominal:     General: Bowel sounds are normal. There is no distension.     Palpations: Abdomen is soft.     Tenderness: There is no abdominal tenderness. There is no rebound.  Musculoskeletal:     Cervical back: Normal range of motion.  Skin:    General: Skin is warm and dry.  Neurological:     Mental Status: She is alert and oriented to person, place, and time.     Coordination: Coordination normal.     Vitals:   03/26/21 1335  BP: 130/90  Pulse: 72  Resp: 18  Temp: 98.4 F (36.9 C)  TempSrc: Oral  SpO2: 98%  Weight: 166 lb 9.6 oz (75.6 kg)  Height: 5\' 4"  (1.626 m)    This visit occurred during the SARS-CoV-2 public health emergency.  Safety protocols were in place, including screening questions prior to the visit, additional usage of staff PPE, and extensive cleaning of exam room while observing appropriate contact time as indicated for disinfecting solutions.    Assessment & Plan:

## 2021-03-27 NOTE — Assessment & Plan Note (Signed)
Flu shot counseled yearly. Covid-19 booster due counseled. Pneumonia complete. Shingrix counseled to get at pharmacy. Tetanus due declines. Colonoscopy due 2025. Mammogram due declines, pap smear aged out and dexa due 2023. Counseled about sun safety and mole surveillance. Counseled about the dangers of distracted driving. Given 10 year screening recommendations.

## 2021-03-27 NOTE — Assessment & Plan Note (Signed)
Referral to urogynecology

## 2021-04-13 ENCOUNTER — Encounter: Payer: Self-pay | Admitting: Cardiology

## 2021-04-13 DIAGNOSIS — H35033 Hypertensive retinopathy, bilateral: Secondary | ICD-10-CM | POA: Diagnosis not present

## 2021-04-13 DIAGNOSIS — H532 Diplopia: Secondary | ICD-10-CM | POA: Diagnosis not present

## 2021-04-13 DIAGNOSIS — H0520 Unspecified exophthalmos: Secondary | ICD-10-CM | POA: Diagnosis not present

## 2021-04-13 DIAGNOSIS — E0500 Thyrotoxicosis with diffuse goiter without thyrotoxic crisis or storm: Secondary | ICD-10-CM | POA: Diagnosis not present

## 2021-04-13 DIAGNOSIS — H26493 Other secondary cataract, bilateral: Secondary | ICD-10-CM | POA: Diagnosis not present

## 2021-04-17 ENCOUNTER — Other Ambulatory Visit: Payer: Self-pay | Admitting: Surgery

## 2021-04-17 DIAGNOSIS — H0520 Unspecified exophthalmos: Secondary | ICD-10-CM

## 2021-04-28 ENCOUNTER — Other Ambulatory Visit: Payer: Self-pay

## 2021-04-28 ENCOUNTER — Ambulatory Visit
Admission: RE | Admit: 2021-04-28 | Discharge: 2021-04-28 | Disposition: A | Discharge status: Home / Self Care | Payer: PPO | Source: Ambulatory Visit | Attending: Surgery | Admitting: Surgery

## 2021-04-28 DIAGNOSIS — H532 Diplopia: Secondary | ICD-10-CM | POA: Diagnosis not present

## 2021-04-28 DIAGNOSIS — H0520 Unspecified exophthalmos: Secondary | ICD-10-CM | POA: Diagnosis not present

## 2021-05-22 DIAGNOSIS — N3941 Urge incontinence: Secondary | ICD-10-CM | POA: Diagnosis not present

## 2021-05-22 DIAGNOSIS — R350 Frequency of micturition: Secondary | ICD-10-CM | POA: Diagnosis not present

## 2021-05-24 DIAGNOSIS — H0520 Unspecified exophthalmos: Secondary | ICD-10-CM | POA: Diagnosis not present

## 2021-05-24 DIAGNOSIS — H532 Diplopia: Secondary | ICD-10-CM | POA: Diagnosis not present

## 2021-05-25 ENCOUNTER — Encounter: Payer: Self-pay | Admitting: Neurology

## 2021-06-07 DIAGNOSIS — N3941 Urge incontinence: Secondary | ICD-10-CM | POA: Diagnosis not present

## 2021-06-08 ENCOUNTER — Other Ambulatory Visit: Payer: Self-pay

## 2021-06-08 ENCOUNTER — Encounter: Payer: Self-pay | Admitting: Neurology

## 2021-06-08 ENCOUNTER — Ambulatory Visit: Payer: PPO | Admitting: Neurology

## 2021-06-08 VITALS — BP 126/60 | HR 56 | Ht 64.5 in | Wt 166.0 lb

## 2021-06-08 DIAGNOSIS — H532 Diplopia: Secondary | ICD-10-CM | POA: Diagnosis not present

## 2021-06-08 DIAGNOSIS — H02402 Unspecified ptosis of left eyelid: Secondary | ICD-10-CM

## 2021-06-08 MED ORDER — PYRIDOSTIGMINE BROMIDE 60 MG PO TABS: ORAL_TABLET | ORAL | 1 refills | Status: DC

## 2021-06-08 NOTE — Patient Instructions (Addendum)
Check myasthenia antibodies  Start mestinon 60mg  at 6pm daily.  Call with an update 3-4 week

## 2021-06-08 NOTE — Progress Notes (Signed)
[facility] Clinic Note - Initial Visit   Date: 06/08/21  Sara [name] MRN: [id number] DOB: 12-Jun-1951   Dear Dr. Kathlen Mody:  Thank you for your kind referral of Sara [name] for consultation of double vision. Although her history is well known to you, please allow [place] to reiterate it for the purpose of our medical record. The patient was accompanied to the clinic by self.  History of Present Illness: Sara [name] is a 70 y.o. right-handed female with GERD presenting for evaluation of double vision.   Starting around 2018, she began having intermittent double vision, which is worse in low light, such as nighttime driving or when she is very tired.  Images are diagonal from each other. She stopped driving at night time because of this.   Double vision is resolved if she closes one eye.  She has some right eye fullness, but denies have droopy eyelids.  No difficulty with swallowing, talking, or limb weakness. Thyroid studies has been normal. CT of the orbit was normal.   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   She lives at home with husband.  Retired from [employer] work. 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   Out-side paper records, electronic medical record, and images have been reviewed where available and summarized as:  Lab Results  Component Value Date   VITAMINB12 421 12/19/2015   Lab Results  Component Value Date   TSH 1.29 03/26/2021   Lab Results  Component Value Date   ESRSEDRATE 21 03/15/2011    Past Medical History:  Diagnosis Date   Anemia    as child   ANXIETY    Cataract    bilateral-removed   Esophageal stricture    GERD (gastroesophageal reflux disease)    PLANTAR FASCIITIS, BILATERAL    PONV (postoperative nausea and vomiting)    SUPRAVENTRICULAR TACHYCARDIA    Urge incontinence     Past Surgical History:  Procedure Laterality  Date   Adenosine Myoview  03/14/05   Cather ablation  04/2007   EYE SURGERY Bilateral 10-2010   cataracts with lens replacments   HERNIA REPAIR  15 yrs ago   NECK LIFT  05/2014   plastic  surgery for a neck lift   VAGINAL HYSTERECTOMY  yrs ago     Medications:  Outpatient Encounter Medications as of 06/08/2021  Medication Sig   conjugated estrogens (PREMARIN) vaginal cream Place 1 Applicatorful vaginally every 14 (fourteen) days.    No facility-administered encounter medications on file as of 06/08/2021.    Allergies:  Allergies  Allergen Reactions   Tramadol Nausea And Vomiting    Dizzy also    Family History: Family History  Problem Relation Age of Onset   Arthritis Mother    Hypertension Mother    Coronary artery disease Father    Heart disease Father    Breast cancer Maternal Grandmother    Stroke Paternal Grandmother    Coronary artery disease Paternal Grandfather    Stomach cancer Neg Hx    Colon cancer Neg Hx    Colon polyps Neg Hx    Esophageal cancer Neg Hx    Rectal cancer Neg Hx     Social History: Social History   Tobacco Use   Smoking status: Never   Smokeless tobacco: Never  Vaping Use   Vaping Use: Never used  Substance Use Topics   Alcohol use: Yes    Alcohol/week:  5.0 standard drinks    Types: 5 Glasses of wine per week    Comment: red wine 5 times per week   Drug use: No   Social History   Social History Narrative   Not on file    Vital Signs:  BP 126/60   Pulse (!) 56   Ht 5' 4.5" (1.638 m)   Wt 166 lb (75.3 kg)   SpO2 96%   BMI 28.05 kg/m   Neurological Exam: MENTAL STATUS including orientation to time, place, person, recent and remote memory, attention span and concentration, language, and fund of knowledge is normal.  Speech is not dysarthric.  CRANIAL NERVES: II:  No visual field defects.     III-IV-VI: Pupils equal round and reactive to light.  Normal conjugate, extra-ocular eye movements in all directions of gaze (pt  reports double vision at end-gaze bilaterally).  No nystagmus.  Mild left ptosis with sustained upgaze.    V:  Normal facial sensation.    VII:  Normal facial symmetry and movements.   VIII:  Normal hearing and vestibular function.   IX-X:  Normal palatal movement.   XI:  Normal shoulder shrug and head rotation.   XII:  Normal tongue strength and range of motion, no deviation or fasciculation.  MOTOR:  No atrophy, fasciculations or abnormal movements.  No pronator drift.  Neck flexion 5/5. Upper Extremity:  Right  Left  Deltoid  5/5   5/5   Biceps  5/5   5/5   Triceps  5/5   5/5   Finger extensors  5/5   5/5   Finger flexors  5/5   5/5   Dorsal interossei  5/5   5/5   Abductor pollicis  5/5   5/5   Tone (Ashworth scale)  0  0   Lower Extremity:  Right  Left  Hip flexors  5/5   5/5   Hip extensors  5/5   5/5   Knee flexors  5/5   5/5   Knee extensors  5/5   5/5   Dorsiflexors  5/5   5/5   Plantarflexors  5/5   5/5   Toe extensors  5/5   5/5   Toe flexors  5/5   5/5   Tone (Ashworth scale)  0  0   MSRs:  Right        Left                  brachioradialis 2+  2+  biceps 2+  2+  triceps 2+  2+  patellar 2+  2+  ankle jerk 2+  2+  Hoffman no  no  plantar response down  down   SENSORY:  Normal and symmetric perception of light touch, pinprick, vibration, and proprioception.   COORDINATION/GAIT: Normal finger-to- nose-finger.  Intact rapid alternating movements bilaterally.  Gait narrow based and stable. Tandem and stressed gait intact.    IMPRESSION: Intermittent, fatigable monocular diplopia and left ptosis, suggestive of ocular myasthenia gravis.  - Check AChR antibody panel  - Trial of mestinon 60mg  at 6pm.  Side effects discussed  - Consider SFEMG, going forward  - Patient to call/MyChart message with update in 3-4 weeks 2. History of ocular migraines  Thank you for allowing me to participate in patient's care.  If I can answer any additional questions, I would be  pleased to do so.    Sincerely,    Greysen Swanton K. Posey Pronto, DO

## 2021-06-13 DIAGNOSIS — N3941 Urge incontinence: Secondary | ICD-10-CM | POA: Diagnosis not present

## 2021-06-13 DIAGNOSIS — R350 Frequency of micturition: Secondary | ICD-10-CM | POA: Diagnosis not present

## 2021-06-18 ENCOUNTER — Other Ambulatory Visit: Payer: Self-pay

## 2021-06-18 ENCOUNTER — Other Ambulatory Visit (INDEPENDENT_AMBULATORY_CARE_PROVIDER_SITE_OTHER): Payer: PPO

## 2021-06-18 DIAGNOSIS — H532 Diplopia: Secondary | ICD-10-CM

## 2021-06-26 LAB — MYASTHENIA GRAVIS PANEL 2
A CHR BINDING ABS: <0.3 nmol/L
ACHR Blocking Abs: <15 % Inhibition (ref <15)
Acetylchol Modul Ab: 4 % Inhibition

## 2021-06-27 ENCOUNTER — Telehealth: Payer: Self-pay | Admitting: Neurology

## 2021-06-27 MED ORDER — PYRIDOSTIGMINE BROMIDE 60 MG PO TABS: ORAL_TABLET | ORAL | 5 refills | Status: DC

## 2021-06-27 NOTE — Telephone Encounter (Signed)
-----   Message from [place], [place] sent at 06/27/2021 10:39 AM EDT ----- Spoken to patient and notified Dr Serita Grit comments. Verbalized understanding. Patient stated that there are some improvement. So she wants to give the medication more time. She is taking 1 tablet daily at 4 pm, it seems to work better than at 6 pm. Patient do not want to do more testing at this time. She would like a refill if that is okay until she can schedule a follow up appointment.

## 2021-06-27 NOTE — Telephone Encounter (Signed)
Spoken to patient and notified that refills have been sent to pharmacy.

## 2021-06-27 NOTE — Telephone Encounter (Signed)
Sure, that would be fine. Mestinon refilled.  Patient will need follow-up in 2-3 months.

## 2021-07-25 DIAGNOSIS — N3941 Urge incontinence: Secondary | ICD-10-CM | POA: Diagnosis not present

## 2021-08-24 DIAGNOSIS — Z01411 Encounter for gynecological examination (general) (routine) with abnormal findings: Secondary | ICD-10-CM | POA: Diagnosis not present

## 2021-08-24 DIAGNOSIS — Z1231 Encounter for screening mammogram for malignant neoplasm of breast: Secondary | ICD-10-CM | POA: Diagnosis not present

## 2021-08-24 DIAGNOSIS — Z6827 Body mass index (BMI) 27.0-27.9, adult: Secondary | ICD-10-CM | POA: Diagnosis not present

## 2021-08-24 DIAGNOSIS — N815 Vaginal enterocele: Secondary | ICD-10-CM | POA: Diagnosis not present

## 2021-08-24 DIAGNOSIS — Z124 Encounter for screening for malignant neoplasm of cervix: Secondary | ICD-10-CM | POA: Diagnosis not present

## 2021-08-24 DIAGNOSIS — Z01419 Encounter for gynecological examination (general) (routine) without abnormal findings: Secondary | ICD-10-CM | POA: Diagnosis not present

## 2021-08-24 DIAGNOSIS — N952 Postmenopausal atrophic vaginitis: Secondary | ICD-10-CM | POA: Diagnosis not present

## 2021-08-24 DIAGNOSIS — M8580 Other specified disorders of bone density and structure, unspecified site: Secondary | ICD-10-CM | POA: Diagnosis not present

## 2021-08-24 LAB — HM MAMMOGRAPHY: HM Mammogram: NORMAL (ref 0–4)

## 2021-08-29 ENCOUNTER — Other Ambulatory Visit: Payer: Self-pay | Admitting: Obstetrics & Gynecology

## 2021-08-29 DIAGNOSIS — M8580 Other specified disorders of bone density and structure, unspecified site: Secondary | ICD-10-CM

## 2021-09-10 ENCOUNTER — Ambulatory Visit: Payer: PPO | Admitting: Neurology

## 2021-09-10 ENCOUNTER — Encounter: Payer: Self-pay | Admitting: Neurology

## 2021-09-10 ENCOUNTER — Other Ambulatory Visit: Payer: Self-pay

## 2021-09-10 VITALS — BP 158/76 | HR 83 | Ht 64.5 in | Wt 167.0 lb

## 2021-09-10 DIAGNOSIS — H02402 Unspecified ptosis of left eyelid: Secondary | ICD-10-CM | POA: Diagnosis not present

## 2021-09-10 DIAGNOSIS — H532 Diplopia: Secondary | ICD-10-CM | POA: Diagnosis not present

## 2021-09-10 NOTE — Progress Notes (Signed)
Follow-up Visit   Date: 09/10/21   Sara [name] MRN: [id number] DOB: 02-20-1951   Interim History: Sara [name] is a 70 y.o. right-handed Caucasian female with GERD returning to the clinic for follow-up of diplopia.  The patient was accompanied to the clinic by self.  History of present illness: Starting around 2018, she began having intermittent double vision, which is worse in low light, such as nighttime driving or when she is very tired.  Images are diagonal from each other. She stopped driving at night time because of this.   Double vision is resolved if she closes one eye.  She has some right eye fullness, but denies have droopy eyelids.  No difficulty with swallowing, talking, or limb weakness. Thyroid studies has been normal. CT of the orbit was normal.   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   She lives at home with husband.  Retired from [employer] work. 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   UPDATE 09/10/2021:  She tried taking mestinon 60mg  at 6p and she felt that maybe it helped for about a month.  She continued to take the medication, but did have constant benefit, so stopped it.  She continues to have intermittent double vision about 3-4 times per week, usually in the evening.  AChR antibodies were negative. No droopy eyelids, difficulty swallowing/talking, or weakness. Overall, symptoms have not progressed over the past few years.   Medications:  Current Outpatient Medications on File Prior to Visit  Medication Sig Dispense Refill   conjugated estrogens (PREMARIN) vaginal cream Place 1 Applicatorful vaginally every 14 (fourteen) days.      Vibegron (GEMTESA) 75 MG TABS      pyridostigmine (MESTINON) 60 MG tablet Take 1 tablet 6pm (Patient not taking: Reported on 09/10/2021) 30 tablet 5   No current facility-administered medications on file prior to visit.    Allergies:   Allergies  Allergen Reactions   Tramadol Nausea And Vomiting    Dizzy also    Vital Signs:  BP (!) 158/76   Pulse 83   Ht 5' 4.5" (1.638 m)   Wt 167 lb (75.8 kg)   SpO2 95%   BMI 28.22 kg/m   Neurological Exam: MENTAL STATUS including orientation to time, place, person, recent and remote memory, attention span and concentration, language, and fund of knowledge is normal.  Speech is not dysarthric.  CRANIAL NERVES:  No visual field defects.  Pupils equal round and reactive to light.  Normal conjugate, extra-ocular eye movements in all directions of gaze.  Left ptosis without sustained upgaze. Face is symmetric. Palate elevates symmetrically.  Tongue is midline.  Facial muscles are 5/5.   MOTOR:  Motor strength is 5/5 in all extremities, no fatigability.  No atrophy, fasciculations or abnormal movements.  No pronator drift.  Tone is normal.    COORDINATION/GAIT:   Gait narrow based and stable.   Data: AChR antibody 06/18/2021:  Negative  IMPRESSION/PLAN: Monocular diplopia, intermittent.  AChR negative. - No significant benefit with mestinon - Discussed pursing SFEMG, however, patient would like to continue to monitor and if symptoms become daily, then reconsider additional testing, which is reasonable given the mild nature of her symptoms  2. History of ocular migraines  Return to clinic in 6 months  Thank you for allowing me to participate in patient's care.  If I can answer any additional questions, I would be pleased to do so.  Sincerely,    Mattia Osterman K. Posey Pronto, DO

## 2021-09-10 NOTE — Patient Instructions (Signed)
Return to clinic in 6 months.

## 2021-09-11 DIAGNOSIS — N951 Menopausal and female climacteric states: Secondary | ICD-10-CM | POA: Diagnosis not present

## 2021-09-11 DIAGNOSIS — N815 Vaginal enterocele: Secondary | ICD-10-CM | POA: Diagnosis not present

## 2021-09-14 ENCOUNTER — Other Ambulatory Visit: Payer: Self-pay

## 2021-09-14 ENCOUNTER — Ambulatory Visit
Admission: RE | Admit: 2021-09-14 | Discharge: 2021-09-14 | Disposition: A | Discharge status: Home / Self Care | Payer: PPO | Source: Ambulatory Visit | Attending: Obstetrics & Gynecology | Admitting: Obstetrics & Gynecology

## 2021-09-14 DIAGNOSIS — M810 Age-related osteoporosis without current pathological fracture: Secondary | ICD-10-CM | POA: Diagnosis not present

## 2021-09-14 DIAGNOSIS — Z780 Asymptomatic menopausal state: Secondary | ICD-10-CM | POA: Diagnosis not present

## 2021-09-14 DIAGNOSIS — M8588 Other specified disorders of bone density and structure, other site: Secondary | ICD-10-CM | POA: Diagnosis not present

## 2021-09-14 DIAGNOSIS — M8580 Other specified disorders of bone density and structure, unspecified site: Secondary | ICD-10-CM

## 2021-09-16 ENCOUNTER — Ambulatory Visit
Admission: EM | Admit: 2021-09-16 | Discharge: 2021-09-16 | Disposition: A | Discharge status: Home / Self Care | Payer: PPO | Attending: Internal Medicine | Admitting: Internal Medicine

## 2021-09-16 ENCOUNTER — Other Ambulatory Visit: Payer: Self-pay

## 2021-09-16 ENCOUNTER — Encounter: Payer: Self-pay | Admitting: Emergency Medicine

## 2021-09-16 DIAGNOSIS — W5503XA Scratched by cat, initial encounter: Secondary | ICD-10-CM

## 2021-09-16 DIAGNOSIS — L03113 Cellulitis of right upper limb: Secondary | ICD-10-CM | POA: Diagnosis not present

## 2021-09-16 DIAGNOSIS — Z23 Encounter for immunization: Secondary | ICD-10-CM

## 2021-09-16 HISTORY — DX: Age-related osteoporosis without current pathological fracture: M81.0

## 2021-09-16 MED ORDER — AZITHROMYCIN 500 MG PO TABS: ORAL_TABLET | ORAL | 0 refills | Status: DC

## 2021-09-16 MED ORDER — TETANUS-DIPHTH-ACELL PERTUSSIS 5-2.5-18.5 LF-MCG/0.5 IM SUSY
0.5000 mL | PREFILLED_SYRINGE | Freq: Once | INTRAMUSCULAR | Status: AC
  Administered 2021-09-16: 0.5 mL via INTRAMUSCULAR

## 2021-09-16 MED ORDER — AZITHROMYCIN 500 MG PO TABS: ORAL_TABLET | ORAL | 0 refills | Status: AC

## 2021-09-16 NOTE — ED Provider Notes (Signed)
[facility]    CSN: [id number] Arrival date & time: 09/16/21  1008      History   Chief Complaint Chief Complaint  Patient presents with   Hand Injury    HPI Sara [name] is a 70 y.o. female.   Patient presents with cat scratch to right hand that she is concerned is infected.  Patient reports that a stray cat scratched the dorsal surface of her right hand yesterday.  Patient denies a bite to the hand or any saliva that affected the wound.  Patient reports that she noticed some redness and swelling this morning.  Denies any fever, chills, body aches, swollen lymph nodes.   Hand Injury  Past Medical History:  Diagnosis Date   Anemia    as child   ANXIETY    Cataract    bilateral-removed   Esophageal stricture    GERD (gastroesophageal reflux disease)    Osteoporosis    PLANTAR FASCIITIS, BILATERAL    PONV (postoperative nausea and vomiting)    SUPRAVENTRICULAR TACHYCARDIA    Urge incontinence     Patient Active Problem List   Diagnosis Date Noted   Bunion 04/17/2015   Hammer toe of left foot 04/17/2015   Routine general medical examination at a health care facility 01/13/2013   URGE INCONTINENCE 03/01/2009    Past Surgical History:  Procedure Laterality Date   Adenosine Myoview  03/14/05   Cather ablation  04/2007   EYE SURGERY Bilateral 10-2010   cataracts with lens replacments   HERNIA REPAIR  15 yrs ago   NECK LIFT  05/2014   plastic  surgery for a neck lift   VAGINAL HYSTERECTOMY  yrs ago    OB History   No obstetric history on file.      Home Medications    Prior to Admission medications   Medication Sig Start Date End Date Taking? Authorizing Provider  azithromycin (ZITHROMAX) 500 MG tablet Take 1 tablet (500 mg total) by mouth daily for 1 day, THEN 0.5 tablets (250 mg total) daily for 4 days. 09/16/21 09/21/21  Teodora Medici, FNP  conjugated estrogens (PREMARIN) vaginal cream Place 1 Applicatorful vaginally every 14  (fourteen) days.     [provider]  pyridostigmine (MESTINON) 60 MG tablet Take 1 tablet 6pm Patient not taking: Reported on 09/10/2021 06/27/21   Alda Berthold, DO  Vibegron [facility]) 75 MG TABS Take one tablet daily 07/25/21   [provider]    Family History Family History  Problem Relation Age of Onset   Arthritis Mother    Hypertension Mother    Coronary artery disease Father    Heart disease Father    Breast cancer Maternal Grandmother    Stroke Paternal Grandmother    Coronary artery disease Paternal Grandfather    Stomach cancer Neg Hx    Colon cancer Neg Hx    Colon polyps Neg Hx    Esophageal cancer Neg Hx    Rectal cancer Neg Hx     Social History Social History   Tobacco Use   Smoking status: Never   Smokeless tobacco: Never  Vaping Use   Vaping Use: Never used  Substance Use Topics   Alcohol use: Yes    Alcohol/week: 5.0 standard drinks    Types: 5 Glasses of wine per week    Comment: red wine 5 times per week   Drug use: No     Allergies   Tramadol   Review of Systems  Review of Systems Per HPI  Physical Exam Triage Vital Signs ED Triage Vitals [09/16/21 1129]  Enc Vitals Group     BP (!) 152/61     Pulse Rate 64     Resp 16     Temp 97.7 F (36.5 C)     Temp Source Oral     SpO2 97 %     Weight      Height      Head Circumference      Peak Flow      Pain Score 4     Pain Loc      Pain Edu?      Excl. in [place]?    No data found.  Updated Vital Signs BP (!) 152/61 (BP Location: Left Arm)   Pulse 64   Temp 97.7 F (36.5 C) (Oral)   Resp 16   SpO2 97%   Visual Acuity Right Eye Distance:   Left Eye Distance:   Bilateral Distance:    Right Eye Near:   Left Eye Near:    Bilateral Near:     Physical Exam Constitutional:      General: She is not in acute distress.    Appearance: Normal appearance. She is not toxic-appearing or diaphoretic.  HENT:     Head: Normocephalic and atraumatic.  Eyes:      Extraocular Movements: Extraocular movements intact.     Conjunctiva/sclera: Conjunctivae normal.  Pulmonary:     Effort: Pulmonary effort is normal.  Lymphadenopathy:     Comments: No lymphadenopathy.   Skin:    General: Skin is warm and dry.     Findings: Erythema present.     Comments: Area of erythema and mild swelling noted to dorsal surface of right hand.  No puncture wound, scratch, abrasion noted.  Neurovascular intact.  Neurological:     General: No focal deficit present.     Mental Status: She is alert and oriented to person, place, and time. Mental status is at baseline.  Psychiatric:        Mood and Affect: Mood normal.        Behavior: Behavior normal.        Thought Content: Thought content normal.        Judgment: Judgment normal.     UC Treatments / Results  Labs (all labs ordered are listed, but only abnormal results are displayed) Labs Reviewed - No data to display  EKG   Radiology No results found.  Procedures Procedures (including critical care time)  Medications Ordered in UC Medications  Tdap (BOOSTRIX) injection 0.5 mL (0.5 mLs Intramuscular Given 09/16/21 1226)    Initial Impression / Assessment and Plan / UC Course  I have reviewed the triage vital signs and the nursing notes.  Pertinent labs & imaging results that were available during my care of the patient were reviewed by me and considered in my medical decision making (see chart for details).     Cat scratch does appear to be infected.  No concerns for systemic infection and there is no Lymphadenopathy.  Will treat with azithromycin antibiotic since this is a scratch as opposed to bite and we are more concerned for Bartonella as opposed to Pasteurella.  Advised patient to monitor hand closely for worsening infection.  No red flags seen on exam. Discussed strict return precautions. Patient verbalized understanding and is agreeable with plan. Tetanus vaccine updated today as well as patient  reports that she has not had one in  the last 5 years.  Final Clinical Impressions(s) / UC Diagnoses   Final diagnoses:  Cat scratch  Cellulitis of right hand     Discharge Instructions      Your cat scratch is infected.  You have been prescribed antibiotic to treat this.  Please follow-up if symptoms persist.     ED Prescriptions     Medication Sig Dispense Auth. Provider   azithromycin (ZITHROMAX) 500 MG tablet  (Status: Discontinued) Take 1 tablet (500 mg total) by mouth daily for 1 day, THEN 0.5 tablets (250 mg total) daily for 4 days. 3 tablet [place], [place] E, Archie   azithromycin (ZITHROMAX) 500 MG tablet Take 1 tablet (500 mg total) by mouth daily for 1 day, THEN 0.5 tablets (250 mg total) daily for 4 days. 3 tablet [place], Michele Rockers, [place]      PDMP not reviewed this encounter.   Teodora Medici, [place] 09/16/21 1247

## 2021-09-16 NOTE — ED Triage Notes (Signed)
Was scratched by stray cat yesterday morning. Today right hand is swollen

## 2021-09-16 NOTE — Discharge Instructions (Addendum)
Your cat scratch is infected.  You have been prescribed antibiotic to treat this.  Please follow-up if symptoms persist.

## 2021-09-28 DIAGNOSIS — M810 Age-related osteoporosis without current pathological fracture: Secondary | ICD-10-CM | POA: Diagnosis not present

## 2021-10-15 ENCOUNTER — Other Ambulatory Visit: Payer: Self-pay | Admitting: Internal Medicine

## 2021-12-24 ENCOUNTER — Ambulatory Visit (INDEPENDENT_AMBULATORY_CARE_PROVIDER_SITE_OTHER): Payer: PPO | Admitting: Internal Medicine

## 2021-12-24 ENCOUNTER — Encounter: Payer: Self-pay | Admitting: Internal Medicine

## 2021-12-24 ENCOUNTER — Other Ambulatory Visit: Payer: Self-pay

## 2021-12-24 DIAGNOSIS — M810 Age-related osteoporosis without current pathological fracture: Secondary | ICD-10-CM | POA: Diagnosis not present

## 2021-12-24 DIAGNOSIS — K219 Gastro-esophageal reflux disease without esophagitis: Secondary | ICD-10-CM

## 2021-12-24 MED ORDER — FAMOTIDINE 40 MG PO TABS: 40.0000 mg | ORAL_TABLET | Freq: Every day | ORAL | 3 refills | Status: DC

## 2021-12-24 NOTE — Assessment & Plan Note (Signed)
FRAX score 17% all fracture risk and 5% hip fracture risk. Advised her to start fosamax which gyn has prescribed. Then repeat DEXA in 2 years.

## 2021-12-24 NOTE — Patient Instructions (Signed)
We have sent in the pepcid.

## 2021-12-24 NOTE — Progress Notes (Signed)
° °  Subjective:   Patient ID: Sara [name], female    DOB: 01-23-1951, 71 y.o.   MRN: [id number]  Gastroesophageal Reflux She reports no abdominal pain, no chest pain, no coughing or no nausea.  The patient is a 71 YO female coming in for GERD and new osteoporosis.   Review of Systems  Constitutional: Negative.   HENT: Negative.    Eyes: Negative.   Respiratory:  Negative for cough, chest tightness and shortness of breath.   Cardiovascular:  Negative for chest pain, palpitations and leg swelling.  Gastrointestinal:  Negative for abdominal distention, abdominal pain, constipation, diarrhea, nausea and vomiting.       GERD  Musculoskeletal: Negative.   Skin: Negative.   Neurological: Negative.   Psychiatric/Behavioral: Negative.     Objective:  Physical Exam Constitutional:      Appearance: She is well-developed.  HENT:     Head: Normocephalic and atraumatic.  Cardiovascular:     Rate and Rhythm: Normal rate and regular rhythm.  Pulmonary:     Effort: Pulmonary effort is normal. No respiratory distress.     Breath sounds: Normal breath sounds. No wheezing or rales.  Abdominal:     General: Bowel sounds are normal. There is no distension.     Palpations: Abdomen is soft.     Tenderness: There is no abdominal tenderness. There is no rebound.  Musculoskeletal:     Cervical back: Normal range of motion.  Skin:    General: Skin is warm and dry.  Neurological:     Mental Status: She is alert and oriented to person, place, and time.     Coordination: Coordination normal.    Vitals:   12/24/21 1410  BP: 128/82  Pulse: 70  Resp: 18  SpO2: 98%  Weight: 163 lb 9.6 oz (74.2 kg)  Height: 5' 4.5" (1.638 m)   This visit occurred during the SARS-CoV-2 public health emergency.  Safety protocols were in place, including screening questions prior to the visit, additional usage of staff PPE, and extensive cleaning of exam room while observing appropriate contact time as indicated for  disinfecting solutions.   Assessment & Plan:  Visit time 25 minutes in face to face communication with patient and coordination of care, additional 10 minutes spent in record review, coordination or care, ordering tests, communicating/referring to other healthcare professionals, documenting in medical records all on the same day of the visit for total time 35 minutes spent on the visit.

## 2021-12-24 NOTE — Assessment & Plan Note (Signed)
Rx pepcid 40 mg qhs to take prn. She typically takes 2-3 times per week.

## 2022-01-16 DIAGNOSIS — H524 Presbyopia: Secondary | ICD-10-CM | POA: Diagnosis not present

## 2022-01-16 DIAGNOSIS — Z961 Presence of intraocular lens: Secondary | ICD-10-CM | POA: Diagnosis not present

## 2022-01-16 DIAGNOSIS — H26493 Other secondary cataract, bilateral: Secondary | ICD-10-CM | POA: Diagnosis not present

## 2022-01-16 DIAGNOSIS — H0520 Unspecified exophthalmos: Secondary | ICD-10-CM | POA: Diagnosis not present

## 2022-01-16 DIAGNOSIS — H532 Diplopia: Secondary | ICD-10-CM | POA: Diagnosis not present

## 2022-01-22 DIAGNOSIS — E559 Vitamin D deficiency, unspecified: Secondary | ICD-10-CM | POA: Diagnosis not present

## 2022-03-11 ENCOUNTER — Ambulatory Visit: Payer: PPO | Admitting: Neurology

## 2022-03-25 ENCOUNTER — Ambulatory Visit (INDEPENDENT_AMBULATORY_CARE_PROVIDER_SITE_OTHER): Payer: PPO

## 2022-03-25 DIAGNOSIS — Z0000 Encounter for general adult medical examination without abnormal findings: Secondary | ICD-10-CM | POA: Diagnosis not present

## 2022-03-25 NOTE — Progress Notes (Signed)
? ?Subjective:  ? Sara [name] is a 71 y.o. female who presents for Medicare Annual (Subsequent) preventive examination. ? ?Virtual Visit via Telephone Note ? ?I connected with  Sara [name] on 03/25/22 at  9:15 AM EDT by telephone and verified that I am speaking with the correct person using two identifiers. ? ?Location: ?Patient: home ?Provider: [facility] ?Persons participating in the virtual visit: patient/Nurse Health Advisor ?  ?I discussed the limitations, risks, security and privacy concerns of performing an evaluation and management service by telephone and the availability of in person appointments. The patient expressed understanding and agreed to proceed. ? ?Interactive audio and video telecommunications were attempted between this nurse and patient, however failed, due to patient having technical difficulties OR patient did not have access to video capability.  We continued and completed visit with audio only. ? ?Some vital signs may be absent or patient reported.  ? ?Clemetine Marker, LPN ? ? ?Review of Systems    ? ?Cardiac Risk Factors include: advanced age (>65mn, >>37women) ? ?   ?Objective:  ?  ?There were no vitals filed for this visit. ?There is no height or weight on file to calculate BMI. ? ? ?  03/25/2022  ?  9:45 AM 09/10/2021  ?  9:24 AM 03/15/2020  ?  9:49 AM 10/25/2019  ?  7:53 AM 02/03/2019  ?  1:22 PM 02/24/2018  ? 11:44 AM 01/29/2018  ?  1:24 PM  ?Advanced Directives  ?Does Patient Have a Medical Advance Directive? Yes Yes Yes Yes Yes Yes Yes  ?Type of AParamed[place]of [place]ount [place] will [place] will;Out of facility DNR (pink MOST or yellow form) [place]Living will H[place] HouseLiving will HBert[place]ceanviewLiving will Living will [place]Living will  ?Does patient want to make changes to medical advance directive?   No - Patient declined No - Patient declined  No - Patient  declined   ?Copy of [place] Chart? No - copy requested  No - copy requested No - copy requested No - copy requested  No - copy requested  ? ? ?Current Medications (verified) ?Outpatient Encounter Medications as of 03/25/2022  ?Medication Sig  ? alendronate (FOSAMAX) 70 MG tablet Take 70 mg by mouth once a week.  ? estradiol (ESTRACE) 0.1 MG/GM vaginal cream Place 0.5 g vaginally 2 (two) times a week.  ? famotidine (PEPCID) 40 MG tablet Take 1 tablet (40 mg total) by mouth daily.  ? Multiple Vitamin (MULTIVITAMIN ADULT PO) Take by mouth.  ? Vibegron (GEMTESA) 75 MG TABS Take one tablet daily  ? [DISCONTINUED] conjugated estrogens (PREMARIN) vaginal cream Place 1 Applicatorful vaginally every 14 (fourteen) days.   ? [DISCONTINUED] Vitamin D, Ergocalciferol, (DRISDOL) 1.25 MG (50000 UNIT) CAPS capsule Take 50,000 Units by mouth once a week.  ? ?No facility-administered encounter medications on file as of 03/25/2022.  ? ? ?Allergies (verified) ?Tramadol  ? ?History: ?Past Medical History:  ?Diagnosis Date  ? Anemia   ? as child  ? ANXIETY   ? Cataract   ? bilateral-removed  ? Esophageal stricture   ? GERD (gastroesophageal reflux disease)   ? Osteoporosis   ? PLANTAR FASCIITIS, BILATERAL   ? PONV (postoperative nausea and vomiting)   ? SUPRAVENTRICULAR TACHYCARDIA   ? Urge incontinence   ? ?Past Surgical History:  ?Procedure Laterality Date  ? Adenosine Myoview  03/14/05  ? Cather ablation  04/2007  ?  EYE SURGERY Bilateral 10-2010  ? cataracts with lens replacments  ? HERNIA REPAIR  15 yrs ago  ? NECK LIFT  05/2014  ? plastic  surgery for a neck lift  ? VAGINAL HYSTERECTOMY  yrs ago  ? ?Family History  ?Problem Relation Age of Onset  ? Arthritis Mother   ? Hypertension Mother   ? Coronary artery disease Father   ? Heart disease Father   ? Breast cancer Maternal Grandmother   ? Stroke Paternal Grandmother   ? Coronary artery disease Paternal Grandfather   ? Stomach cancer Neg Hx   ? Colon cancer Neg Hx    ? Colon polyps Neg Hx   ? Esophageal cancer Neg Hx   ? Rectal cancer Neg Hx   ? ?Social History  ? ?Socioeconomic History  ? Marital status: Married  ?  Spouse name: Not on file  ? Number of children: 0  ? Years of education: Not on file  ? Highest education level: Not on file  ?Occupational History  ? Occupation: Radiation protection practitioner  ?  Employer: [employer]  ?Tobacco Use  ? Smoking status: Never  ? Smokeless tobacco: Never  ?Vaping Use  ? Vaping Use: Never used  ?Substance and Sexual Activity  ? Alcohol use: Yes  ?  Alcohol/week: 1.0 standard drink  ?  Types: 1 Glasses of wine per week  ?  Comment: occasionally  ? Drug use: No  ? Sexual activity: Not Currently  ?  Partners: Male  ?Other Topics Concern  ? Not on file  ?Social History Narrative  ? 1 step daughter, 2 grandchildren and 1 great grandchild  ? ?Social Determinants of Health  ? ?Financial Resource Strain: Low Risk   ? Difficulty of Paying Living Expenses: Not hard at all  ?Food Insecurity: No Food Insecurity  ? Worried About Charity fundraiser in the Last Year: Never true  ? Ran Out of Food in the Last Year: Never true  ?Transportation Needs: No Transportation Needs  ? Lack of Transportation (Medical): No  ? Lack of Transportation (Non-Medical): No  ?Physical Activity: Inactive  ? Days of Exercise per Week: 0 days  ? Minutes of Exercise per Session: 0 min  ?Stress: No Stress Concern Present  ? Feeling of Stress : Not at all  ?Social Connections: Socially Integrated  ? Frequency of Communication with Friends and Family: More than three times a week  ? Frequency of Social Gatherings with Friends and Family: More than three times a week  ? Attends Religious Services: More than 4 times per year  ? Active Member of Clubs or Organizations: Yes  ? Attends Archivist Meetings: More than 4 times per year  ? Marital Status: Married  ? ? ?Tobacco Counseling ?Counseling given: Not Answered ? ? ?Clinical Intake: ? ?Pre-visit preparation completed:  Yes ? ?Pain : No/denies pain ? ?  ? ?Nutritional Risks: None ?Diabetes: No ? ?How often do you need to have someone help you when you read instructions, pamphlets, or other written materials from your doctor or pharmacy?: 1 - Never ? ? ?Interpreter Needed?: No ? ?Information entered by :: Clemetine Marker LPN ? ? ?Activities of Daily Living ? ?  03/25/2022  ?  9:45 AM 03/26/2021  ?  1:38 PM  ?In your present state of health, do you have any difficulty performing the following activities:  ?Hearing? 0 0  ?Vision? 0 0  ?Difficulty concentrating or making decisions? 0 0  ?Walking or climbing  stairs? 0 0  ?Dressing or bathing? 0 0  ?Doing errands, shopping? 0 0  ?Preparing Food and eating ? N   ?Using the Toilet? N   ?In the past six months, have you accidently leaked urine? Y   ?Do you have problems with loss of bowel control? N   ?Managing your Medications? N   ?Managing your Finances? N   ?Housekeeping or managing your Housekeeping? N   ? ? ?Patient Care Team: ?Hoyt Koch, MD as PCP - General (Internal Medicine) ?Azucena Fallen, MD as Consulting Physician (Obstetrics and Gynecology) ?Alda Berthold, DO as Consulting Physician (Neurology) ? ?Indicate any recent Medical Services you may have received from other than [facility] providers in the past year (date may be approximate). ? ?   ?Assessment:  ? This is a routine wellness examination for Bowman. ? ?Hearing/Vision screen ?Hearing Screening - Comments:: Pt denies hearing difficulty ?Vision Screening - Comments:: Annual vision screenings Dr. Tasia Catchings [facility] ? ?Dietary issues and exercise activities discussed: ?Current Exercise Habits: The patient does not participate in regular exercise at present, Exercise limited by: None identified ? ? Goals Addressed   ? ?  ?  ?  ?  ? This Visit's Progress  ?  Client understands the importance of follow-up with providers by attending scheduled visits   On track  ?  DIET - INCREASE WATER INTAKE     ?  Recommend drinking 6-8  glasses of water per day  ?  ?  Patient Stated   On track  ?  Maintain current health status. Enjoy life and family. ? ?  ? ?  ? ?Depression Screen ? ?  03/25/2022  ?  9:44 AM 03/26/2021  ?  1:39 PM 03/15/2020  ?  9

## 2022-03-25 NOTE — Patient Instructions (Signed)
Sara [name] , ?Thank you for taking time to come for your Medicare Wellness Visit. I appreciate your ongoing commitment to your health goals. Please review the following plan we discussed and let me know if I can assist you in the future.  ? ?Screening recommendations/referrals: ?Colonoscopy: done 10/25/19. Repeat 09/2024 ?Mammogram: done 08/24/21 ?Bone Density: done 09/14/21 ?Recommended yearly ophthalmology/optometry visit for glaucoma screening and checkup ?Recommended yearly dental visit for hygiene and checkup ? ?Vaccinations: ?Influenza vaccine: done 08/30/21 ?Pneumococcal vaccine: done 12/19/15 ?Tdap vaccine: done 09/16/21 ?Shingles vaccine: Shingrix discussed. Please contact your pharmacy for coverage information.  ?Covid-19:done 12/06/19, 12/27/19, 09/04/20, 04/11/21 & 09/19/21 ? ?Advanced directives: Please bring a copy of your health care power of attorney and living will to the office at your convenience.  ? ?Conditions/risks identified: Recommend increasing physical activity  ? ?Next appointment: Follow up in one year for your annual wellness visit  ? ? ?Preventive Care 14 Years and Older, Female ?Preventive care refers to lifestyle choices and visits with your health care provider that can promote health and wellness. ?What does preventive care include? ?A yearly physical exam. This is also called an annual well check. ?Dental exams once or twice a year. ?Routine eye exams. Ask your health care provider how often you should have your eyes checked. ?Personal lifestyle choices, including: ?Daily care of your teeth and gums. ?Regular physical activity. ?Eating a healthy diet. ?Avoiding tobacco and drug use. ?Limiting alcohol use. ?Practicing safe sex. ?Taking low-dose aspirin every day. ?Taking vitamin and mineral supplements as recommended by your health care provider. ?What happens during an annual well check? ?The services and screenings done by your health care provider during your annual well check will depend  on your age, overall health, lifestyle risk factors, and family history of disease. ?Counseling  ?Your health care provider may ask you questions about your: ?Alcohol use. ?Tobacco use. ?Drug use. ?Emotional well-being. ?Home and relationship well-being. ?Sexual activity. ?Eating habits. ?History of falls. ?Memory and ability to understand (cognition). ?Work and work Statistician. ?Reproductive health. ?Screening  ?You may have the following tests or measurements: ?Height, weight, and BMI. ?Blood pressure. ?Lipid and cholesterol levels. These may be checked every 5 years, or more frequently if you are over 70 years old. ?Skin check. ?Lung cancer screening. You may have this screening every year starting at age 29 if you have a 30-pack-year history of smoking and currently smoke or have quit within the past 15 years. ?Fecal occult blood test (FOBT) of the stool. You may have this test every year starting at age 76. ?Flexible sigmoidoscopy or colonoscopy. You may have a sigmoidoscopy every 5 years or a colonoscopy every 10 years starting at age 19. ?Hepatitis C blood test. ?Hepatitis B blood test. ?Sexually transmitted disease (STD) testing. ?Diabetes screening. This is done by checking your blood sugar (glucose) after you have not eaten for a while (fasting). You may have this done every 1-3 years. ?Bone density scan. This is done to screen for osteoporosis. You may have this done starting at age 61. ?Mammogram. This may be done every 1-2 years. Talk to your health care provider about how often you should have regular mammograms. ?Talk with your health care provider about your test results, treatment options, and if necessary, the need for more tests. ?Vaccines  ?Your health care provider may recommend certain vaccines, such as: ?Influenza vaccine. This is recommended every year. ?Tetanus, diphtheria, and acellular pertussis (Tdap, Td) vaccine. You may need a Td booster every 10  years. ?Zoster vaccine. You may need  this after age 19. ?Pneumococcal 13-valent conjugate (PCV13) vaccine. One dose is recommended after age 11. ?Pneumococcal polysaccharide (PPSV23) vaccine. One dose is recommended after age 57. ?Talk to your health care provider about which screenings and vaccines you need and how often you need them. ?This information is not intended to replace advice given to you by your health care provider. Make sure you discuss any questions you have with your health care provider. ?Document Released: 12/08/2015 Document Revised: 07/31/2016 Document Reviewed: 09/12/2015 ?Elsevier Interactive Patient Education ? 2017 [place]. ? ?Fall Prevention in the Home ?Falls can cause injuries. They can happen to people of all ages. There are many things you can do to make your home safe and to help prevent falls. ?What can I do on the outside of my home? ?Regularly fix the edges of walkways and driveways and fix any cracks. ?Remove anything that might make you trip as you walk through a door, such as a raised step or threshold. ?Trim any bushes or trees on the path to your home. ?Use bright outdoor lighting. ?Clear any walking paths of anything that might make someone trip, such as rocks or tools. ?Regularly check to see if handrails are loose or broken. Make sure that both sides of any steps have handrails. ?Any raised decks and porches should have guardrails on the edges. ?Have any leaves, snow, or ice cleared regularly. ?Use sand or salt on walking paths during winter. ?Clean up any spills in your garage right away. This includes oil or grease spills. ?What can I do in the bathroom? ?Use night lights. ?Install grab bars by the toilet and in the tub and shower. Do not use towel bars as grab bars. ?Use non-skid mats or decals in the tub or shower. ?If you need to sit down in the shower, use a plastic, non-slip stool. ?Keep the floor dry. Clean up any water that spills on the floor as soon as it happens. ?Remove soap buildup in the tub  or shower regularly. ?Attach bath mats securely with double-sided non-slip rug tape. ?Do not have throw rugs and other things on the floor that can make you trip. ?What can I do in the bedroom? ?Use night lights. ?Make sure that you have a light by your bed that is easy to reach. ?Do not use any sheets or blankets that are too big for your bed. They should not hang down onto the floor. ?Have a firm chair that has side arms. You can use this for support while you get dressed. ?Do not have throw rugs and other things on the floor that can make you trip. ?What can I do in the kitchen? ?Clean up any spills right away. ?Avoid walking on wet floors. ?Keep items that you use a lot in easy-to-reach places. ?If you need to reach something above you, use a strong step stool that has a grab bar. ?Keep electrical cords out of the way. ?Do not use floor polish or wax that makes floors slippery. If you must use wax, use non-skid floor wax. ?Do not have throw rugs and other things on the floor that can make you trip. ?What can I do with my stairs? ?Do not leave any items on the stairs. ?Make sure that there are handrails on both sides of the stairs and use them. Fix handrails that are broken or loose. Make sure that handrails are as long as the stairways. ?Check any carpeting to make sure  that it is firmly attached to the stairs. Fix any carpet that is loose or worn. ?Avoid having throw rugs at the top or bottom of the stairs. If you do have throw rugs, attach them to the floor with carpet tape. ?Make sure that you have a light switch at the top of the stairs and the bottom of the stairs. If you do not have them, ask someone to add them for you. ?What else can I do to help prevent falls? ?Wear shoes that: ?Do not have high heels. ?Have rubber bottoms. ?Are comfortable and fit you well. ?Are closed at the toe. Do not wear sandals. ?If you use a stepladder: ?Make sure that it is fully opened. Do not climb a closed stepladder. ?Make  sure that both sides of the stepladder are locked into place. ?Ask someone to hold it for you, if possible. ?Clearly mark and make sure that you can see: ?Any grab bars or handrails. ?First and last s

## 2022-04-11 DIAGNOSIS — D2239 Melanocytic nevi of other parts of face: Secondary | ICD-10-CM | POA: Diagnosis not present

## 2022-04-11 DIAGNOSIS — D485 Neoplasm of uncertain behavior of skin: Secondary | ICD-10-CM | POA: Diagnosis not present

## 2022-04-11 DIAGNOSIS — L814 Other melanin hyperpigmentation: Secondary | ICD-10-CM | POA: Diagnosis not present

## 2022-05-14 ENCOUNTER — Ambulatory Visit (INDEPENDENT_AMBULATORY_CARE_PROVIDER_SITE_OTHER): Payer: PPO | Admitting: Internal Medicine

## 2022-05-14 ENCOUNTER — Encounter: Payer: Self-pay | Admitting: Internal Medicine

## 2022-05-14 VITALS — BP 132/82 | HR 76 | Temp 98.0°F | Resp 18 | Ht 64.5 in | Wt 165.0 lb

## 2022-05-14 DIAGNOSIS — M810 Age-related osteoporosis without current pathological fracture: Secondary | ICD-10-CM

## 2022-05-14 DIAGNOSIS — I10 Essential (primary) hypertension: Secondary | ICD-10-CM

## 2022-05-14 DIAGNOSIS — Z136 Encounter for screening for cardiovascular disorders: Secondary | ICD-10-CM

## 2022-05-14 DIAGNOSIS — K219 Gastro-esophageal reflux disease without esophagitis: Secondary | ICD-10-CM

## 2022-05-14 DIAGNOSIS — Z0000 Encounter for general adult medical examination without abnormal findings: Secondary | ICD-10-CM

## 2022-05-14 DIAGNOSIS — N3941 Urge incontinence: Secondary | ICD-10-CM

## 2022-05-14 LAB — COMPREHENSIVE METABOLIC PANEL
ALT: 16 U/L (ref 0–35)
AST: 17 U/L (ref 0–37)
Albumin: 4.1 g/dL (ref 3.5–5.2)
Alkaline Phosphatase: 59 U/L (ref 39–117)
BUN: 14 mg/dL (ref 6–23)
CO2: 29 mEq/L (ref 19–32)
Calcium: 9.6 mg/dL (ref 8.4–10.5)
Chloride: 104 mEq/L (ref 96–112)
Creatinine, Ser: 0.75 mg/dL (ref 0.40–1.20)
GFR: 79.52 mL/min (ref >60.00)
Glucose, Bld: 81 mg/dL (ref 70–99)
Potassium: 4.1 mEq/L (ref 3.5–5.1)
Sodium: 139 mEq/L (ref 135–145)
Total Bilirubin: 0.5 mg/dL (ref 0.2–1.2)
Total Protein: 7.1 g/dL (ref 6.0–8.3)

## 2022-05-14 LAB — CBC
HCT: 40.6 % (ref 36.0–46.0)
Hemoglobin: 13.7 g/dL (ref 12.0–15.0)
MCHC: 33.7 g/dL (ref 30.0–36.0)
MCV: 92.1 fl (ref 78.0–100.0)
Platelets: 238 10*3/uL (ref 150.0–400.0)
RBC: 4.41 Mil/uL (ref 3.87–5.11)
RDW: 13.2 % (ref 11.5–15.5)
WBC: 5.4 10*3/uL (ref 4.0–10.5)

## 2022-05-14 LAB — TSH: TSH: 1.43 u[IU]/mL (ref 0.35–5.50)

## 2022-05-14 LAB — LIPID PANEL
Cholesterol: 197 mg/dL (ref 0–200)
HDL: 61.6 mg/dL (ref >39.00)
LDL Cholesterol: 105 mg/dL — ABNORMAL HIGH (ref 0–99)
NonHDL: 135.73
Total CHOL/HDL Ratio: 3
Triglycerides: 153 mg/dL — ABNORMAL HIGH (ref 0.0–149.0)
VLDL: 30.6 mg/dL (ref 0.0–40.0)

## 2022-05-14 LAB — VITAMIN B12: Vitamin B-12: 235 pg/mL (ref 211–911)

## 2022-05-14 LAB — VITAMIN D 25 HYDROXY (VIT D DEFICIENCY, FRACTURES): VITD: 32.18 ng/mL (ref 30.00–100.00)

## 2022-05-14 NOTE — Assessment & Plan Note (Signed)
Flu shot yearly. Covid-19 counseled. Pneumonia complete. Shingrix counseled. Tetanus up to date. Colonoscopy up to date. Mammogram up to date, pap smear aged out and dexa up to date. Counseled about sun safety and mole surveillance. Counseled about the dangers of distracted driving. Given 10 year screening recommendations.

## 2022-05-14 NOTE — Assessment & Plan Note (Signed)
Taking fosamax since January 2023. Checking CBC and CMP and vitamin D. Adjust as needed.

## 2022-05-14 NOTE — Patient Instructions (Addendum)
Your EKG is the same as before. We will check the labs today.

## 2022-05-14 NOTE — Progress Notes (Signed)
   Subjective:   Patient ID: Sara [name], female    DOB: 1951-05-30, 71 y.o.   MRN: [id number]  Hypertension Pertinent negatives include no chest pain, palpitations or shortness of breath.   The patient is here for physical.  PMH, [place], social history reviewed and updated  Review of Systems  Constitutional: Negative.   HENT: Negative.    Eyes: Negative.   Respiratory:  Negative for cough, chest tightness and shortness of breath.   Cardiovascular:  Negative for chest pain, palpitations and leg swelling.  Gastrointestinal:  Negative for abdominal distention, abdominal pain, constipation, diarrhea, nausea and vomiting.  Musculoskeletal: Negative.   Skin: Negative.   Neurological: Negative.   Psychiatric/Behavioral: Negative.      Objective:  Physical Exam Constitutional:      Appearance: She is well-developed.  HENT:     Head: Normocephalic and atraumatic.  Cardiovascular:     Rate and Rhythm: Normal rate and regular rhythm.  Pulmonary:     Effort: Pulmonary effort is normal. No respiratory distress.     Breath sounds: Normal breath sounds. No wheezing or rales.  Abdominal:     General: Bowel sounds are normal. There is no distension.     Palpations: Abdomen is soft.     Tenderness: There is no abdominal tenderness. There is no rebound.  Musculoskeletal:     Cervical back: Normal range of motion.  Skin:    General: Skin is warm and dry.  Neurological:     Mental Status: She is alert and oriented to person, place, and time.     Coordination: Coordination normal.    Vitals:   05/14/22 0854 05/14/22 0927  BP: (!) 155/88 132/82  Pulse: 76   Resp: 18   Temp: 98 F (36.7 C)   SpO2: 94%   Weight: 165 lb (74.8 kg)   Height: 5' 4.5" (1.638 m)    EKG: Rate 52, axis right, interval normal, sinus brady, no st or t wave changes, no significant change compared to prior 2016  Assessment & Plan:

## 2022-05-14 NOTE — Assessment & Plan Note (Signed)
Stable on gemtesa 75 mg daily. Continue.

## 2022-05-14 NOTE — Assessment & Plan Note (Signed)
Stable taking pepcid a few times a week.

## 2022-06-18 ENCOUNTER — Encounter: Payer: Self-pay | Admitting: Internal Medicine

## 2022-06-18 ENCOUNTER — Telehealth (INDEPENDENT_AMBULATORY_CARE_PROVIDER_SITE_OTHER): Payer: PPO | Admitting: Internal Medicine

## 2022-06-18 DIAGNOSIS — U071 COVID-19: Secondary | ICD-10-CM | POA: Diagnosis not present

## 2022-06-18 MED ORDER — MOLNUPIRAVIR 200 MG PO CAPS: 4.0000 | ORAL_CAPSULE | Freq: Two times a day (BID) | ORAL | 0 refills | Status: AC

## 2022-06-18 NOTE — Assessment & Plan Note (Signed)
Acute Symptoms started 2 days ago-today is day 3 of symptoms Tested positive yesterday at home Symptoms mild in nature Continue Tylenol, ibuprofen as needed.  Discussed she can take any over-the-counter cold medication if needed Continue increased rest and fluids Discussed antiviral treatments and possible side effects.  We will start molnupiravir 800 mg twice daily x5 days She will continue to quarantine herself from her husband If she develops any concerning symptoms or has any questions she will contact us

## 2022-06-18 NOTE — Patient Instructions (Signed)
     Blood work was ordered.     Medications changes include :   none   Your prescription(s) have been sent to your pharmacy.    A referral was ordered for [facility] for a colonoscopy.     Someone from that office will call you to schedule an appointment.    Return in about 6 months (around 12/26/2022) for Physical Exam.   [facility] Phone: [phone]  

## 2022-06-18 NOTE — Progress Notes (Signed)
Virtual Visit via Video Note  I connected with Sara [name] on 06/18/22 at  3:40 PM EDT by a video enabled telemedicine application and verified that I am speaking with the correct person using two identifiers.   I discussed the limitations of evaluation and management by telemedicine and the availability of in person appointments. The patient expressed understanding and agreed to proceed.  Present for the visit:  Myself, Dr Billey Gosling, Landry Dyke.  The patient is currently at home and I am in the office.    No referring provider.    History of Present Illness: This is an acute visit for COVID.  Her symptoms started 7/23-2 days ago.  She tested positive yesterday.  Her symptoms started with some mild congestion and drainage that caused a scratchy sore throat.  She states chills, fever up to 100.4, burning in her eyes, a cough that sounds croupy, and unsteadiness or feeling shaky inside and fatigue.  She has been taking Tylenol, advil   O2 96-97%,  BP 140's/111  Never had covid.  Had 5 vaccines for covid  She is concerned because her husband is 29 and she does not want to give it to him.  They have been staying away from each other.  Review of Systems  Constitutional:  Positive for chills, fever (100.4) and malaise/fatigue.  HENT:  Positive for congestion and sore throat.        No change in taste or smell  Eyes:        Eye burn  Respiratory:  Positive for cough (sounds croupy). Negative for shortness of breath and wheezing.   Cardiovascular:  Negative for chest pain.  Gastrointestinal:  Negative for diarrhea, nausea and vomiting.  Musculoskeletal:  Negative for myalgias.  Neurological:  Negative for dizziness and headaches.       Feels shaky and uneasy on feet      Social History   Socioeconomic History   Marital status: Married    Spouse name: Not on file   Number of children: 0   Years of education: Not on file   Highest education level: Not on file   Occupational History   Occupation: Radiation protection practitioner    Employer: [employer]  Tobacco Use   Smoking status: Never   Smokeless tobacco: Never  Vaping Use   Vaping Use: Never used  Substance and Sexual Activity   Alcohol use: Yes    Alcohol/week: 1.0 standard drink of alcohol    Types: 1 Glasses of wine per week    Comment: occasionally   Drug use: No   Sexual activity: Not Currently    Partners: Male  Other Topics Concern   Not on file  Social History Narrative   1 step daughter, 2 grandchildren and 1 great grandchild   Social Determinants of Health   Financial Resource Strain: Low Risk  (03/25/2022)   Overall Financial Resource Strain (CARDIA)    Difficulty of Paying Living Expenses: Not hard at all  Food Insecurity: No Food Insecurity (03/25/2022)   Hunger Vital Sign    Worried About Running Out of Food in the Last Year: Never true    Romulus in the Last Year: Never true  Transportation Needs: No Transportation Needs (03/25/2022)   PRAPARE - Hydrologist (Medical): No    Lack of Transportation (Non-Medical): No  Physical Activity: Inactive (03/25/2022)   Exercise Vital Sign    Days of Exercise per Week: 0 days  Minutes of Exercise per Session: 0 min  Stress: No Stress Concern Present (03/25/2022)   [place]    Feeling of Stress : Not at all  Social Connections: [place] (03/25/2022)   Social Connection and Isolation Panel [NHANES]    Frequency of Communication with Friends and Family: More than three times a week    Frequency of Social Gatherings with Friends and Family: More than three times a week    Attends Religious Services: More than 4 times per year    Active Member of [employer] or Organizations: Yes    Attends Music therapist: More than 4 times per year    Marital Status: Married     Observations/Objective: Appears well in NAD Breathing  normally, able to speak in full sentences Skin appears warm and dry  Assessment and Plan:  See Problem List for Assessment and Plan of chronic medical problems.   Follow Up Instructions:    I discussed the assessment and treatment plan with the patient. The patient was provided an opportunity to ask questions and all were answered. The patient agreed with the plan and demonstrated an understanding of the instructions.   The patient was advised to call back or seek an in-person evaluation if the symptoms worsen or if the condition fails to improve as anticipated.    Binnie Rail, MD

## 2022-06-25 ENCOUNTER — Encounter: Payer: Self-pay | Admitting: Emergency Medicine

## 2022-06-25 ENCOUNTER — Other Ambulatory Visit: Payer: Self-pay

## 2022-06-25 ENCOUNTER — Ambulatory Visit
Admission: EM | Admit: 2022-06-25 | Discharge: 2022-06-25 | Disposition: A | Discharge status: Home / Self Care | Payer: PPO | Attending: Physician Assistant | Admitting: Physician Assistant

## 2022-06-25 DIAGNOSIS — W5501XA Bitten by cat, initial encounter: Secondary | ICD-10-CM

## 2022-06-25 DIAGNOSIS — S61431A Puncture wound without foreign body of right hand, initial encounter: Secondary | ICD-10-CM | POA: Diagnosis not present

## 2022-06-25 MED ORDER — AMOXICILLIN-POT CLAVULANATE 875-125 MG PO TABS: 1.0000 | ORAL_TABLET | Freq: Two times a day (BID) | ORAL | 0 refills | Status: DC

## 2022-06-25 NOTE — ED Provider Notes (Signed)
[facility]    CSN: [id number] Arrival date & time: 06/25/22  1138      History   Chief Complaint Chief Complaint  Patient presents with   Animal Bite    HPI Sara [name] is a 71 y.o. female.   Patient here today for evaluation of a cat bite to her right hand that occurred yesterday.  She reports that she did not see the cat bite her but thinks with the wound this is what happened.  The cat is a stray cat that she has been feeding for several years.  She is unaware of vaccine history of.  She does see the cat daily and feels that animal control will be able to capture For quarantine.  The history is provided by the patient.    Past Medical History:  Diagnosis Date   Anemia    as child   ANXIETY    Cataract    bilateral-removed   Esophageal stricture    GERD (gastroesophageal reflux disease)    Osteoporosis    PLANTAR FASCIITIS, BILATERAL    PONV (postoperative nausea and vomiting)    SUPRAVENTRICULAR TACHYCARDIA    Urge incontinence     Patient Active Problem List   Diagnosis Date Noted   COVID 06/18/2022   Osteoporosis 12/24/2021   Bunion 04/17/2015   Hammer toe of left foot 04/17/2015   Routine general medical examination at a health care facility 01/13/2013   GERD (gastroesophageal reflux disease) 12/04/2012   URGE INCONTINENCE 03/01/2009    Past Surgical History:  Procedure Laterality Date   Adenosine Myoview  03/14/05   Cather ablation  04/2007   EYE SURGERY Bilateral 10-2010   cataracts with lens replacments   HERNIA REPAIR  15 yrs ago   NECK LIFT  05/2014   plastic  surgery for a neck lift   VAGINAL HYSTERECTOMY  yrs ago    OB History   No obstetric history on file.      Home Medications    Prior to Admission medications   Medication Sig Start Date End Date Taking? Authorizing Provider  amoxicillin-clavulanate (AUGMENTIN) 875-125 MG tablet Take 1 tablet by mouth every 12 (twelve) hours. 06/25/22  Yes Francene Finders, PA-C   alendronate (FOSAMAX) 70 MG tablet Take 70 mg by mouth once a week. 09/28/21   [provider]  estradiol (ESTRACE) 0.1 MG/GM vaginal cream Place 0.5 g vaginally 2 (two) times a week. 03/19/22   [provider]  famotidine (PEPCID) 40 MG tablet Take 1 tablet (40 mg total) by mouth daily. 12/24/21   Hoyt Koch, MD  Multiple Vitamin (MULTIVITAMIN ADULT PO) Take by mouth.    [provider]  Vibegron (GEMTESA) 52 MG TABS Take one tablet daily 07/25/21   [provider]    Family History Family History  Problem Relation Age of Onset   Arthritis Mother    Hypertension Mother    Coronary artery disease Father    Heart disease Father    Breast cancer Maternal Grandmother    Stroke Paternal Grandmother    Coronary artery disease Paternal Grandfather    Stomach cancer Neg Hx    Colon cancer Neg Hx    Colon polyps Neg Hx    Esophageal cancer Neg Hx    Rectal cancer Neg Hx     Social History Social History   Tobacco Use   Smoking status: Never   Smokeless tobacco: Never  Vaping Use   Vaping Use: Never  used  Substance Use Topics   Alcohol use: Yes    Alcohol/week: 1.0 standard drink of alcohol    Types: 1 Glasses of wine per week    Comment: occasionally   Drug use: No     Allergies   Tramadol   Review of Systems Review of Systems  Constitutional:  Negative for chills and fever.  Eyes:  Negative for discharge and redness.  Gastrointestinal:  Negative for nausea and vomiting.  Skin:  Positive for color change and wound.  Neurological:  Negative for numbness.     Physical Exam Triage Vital Signs ED Triage Vitals  Enc Vitals Group     BP      Pulse      Resp      Temp      Temp src      SpO2      Weight      Height      Head Circumference      Peak Flow      Pain Score      Pain Loc      Pain Edu?      Excl. in [place]?    No data found.  Updated Vital Signs BP 135/85 (BP Location: Left Arm)   Pulse 84   Temp 98.3  F (36.8 C) (Oral)   Resp 18   SpO2 98%      Physical Exam Vitals and nursing note reviewed.  Constitutional:      General: She is not in acute distress.    Appearance: Normal appearance. She is not ill-appearing.  HENT:     Head: Normocephalic and atraumatic.  Eyes:     Conjunctiva/sclera: Conjunctivae normal.  Cardiovascular:     Rate and Rhythm: Normal rate.  Pulmonary:     Effort: Pulmonary effort is normal.  Musculoskeletal:     Comments: Diffuse swelling to dorsal aspect of right hand with erythema.  Few puncture wounds noted to dorsal hand without bleeding or drainage.  Full range of motion of right hand and wrist  Skin:    Capillary Refill: Normal cap refill to right fingers Neurological:     Mental Status: She is alert.     Comments: Sensation intact to right fingers distally  Psychiatric:        Mood and Affect: Mood normal.        Behavior: Behavior normal.        Thought Content: Thought content normal.      UC Treatments / Results  Labs (all labs ordered are listed, but only abnormal results are displayed) Labs Reviewed - No data to display  EKG   Radiology No results found.  Procedures Procedures (including critical care time)  Medications Ordered in UC Medications - No data to display  Initial Impression / Assessment and Plan / UC Course  I have reviewed the triage vital signs and the nursing notes.  Pertinent labs & imaging results that were available during my care of the patient were reviewed by me and considered in my medical decision making (see chart for details).    Augmentin provided to cover possible infection due to cat bite and recommended she contact animal control to capture Her quarantine.  Strongly advised she report for rabies vaccinations if animal control cannot capture.  Patient expresses understanding.  Encouraged follow-up if symptoms do not improve or worsen.  Final Clinical Impressions(s) / UC Diagnoses   Final  diagnoses:  Cat bite, initial encounter  Discharge Instructions   None    ED Prescriptions     Medication Sig Dispense Auth. Provider   amoxicillin-clavulanate (AUGMENTIN) 875-125 MG tablet Take 1 tablet by mouth every 12 (twelve) hours. 14 tablet Francene Finders, PA-C      PDMP not reviewed this encounter.   Francene Finders, PA-C 06/25/22 1225

## 2022-06-25 NOTE — ED Triage Notes (Signed)
Pt here for cat bite to right wrist from known stray cat; unknown vaccine history but animal control has been contacted and follow up guidelines given

## 2022-09-24 DIAGNOSIS — R32 Unspecified urinary incontinence: Secondary | ICD-10-CM | POA: Diagnosis not present

## 2022-09-24 DIAGNOSIS — Z6828 Body mass index (BMI) 28.0-28.9, adult: Secondary | ICD-10-CM | POA: Diagnosis not present

## 2022-09-24 DIAGNOSIS — Z124 Encounter for screening for malignant neoplasm of cervix: Secondary | ICD-10-CM | POA: Diagnosis not present

## 2022-09-24 DIAGNOSIS — Z90711 Acquired absence of uterus with remaining cervical stump: Secondary | ICD-10-CM | POA: Diagnosis not present

## 2022-09-24 DIAGNOSIS — Z01419 Encounter for gynecological examination (general) (routine) without abnormal findings: Secondary | ICD-10-CM | POA: Diagnosis not present

## 2022-09-24 DIAGNOSIS — Z01411 Encounter for gynecological examination (general) (routine) with abnormal findings: Secondary | ICD-10-CM | POA: Diagnosis not present

## 2022-09-24 DIAGNOSIS — M810 Age-related osteoporosis without current pathological fracture: Secondary | ICD-10-CM | POA: Diagnosis not present

## 2022-09-24 DIAGNOSIS — N815 Vaginal enterocele: Secondary | ICD-10-CM | POA: Diagnosis not present

## 2022-09-24 DIAGNOSIS — N952 Postmenopausal atrophic vaginitis: Secondary | ICD-10-CM | POA: Diagnosis not present

## 2022-09-24 DIAGNOSIS — N898 Other specified noninflammatory disorders of vagina: Secondary | ICD-10-CM | POA: Diagnosis not present

## 2022-09-24 DIAGNOSIS — Z1231 Encounter for screening mammogram for malignant neoplasm of breast: Secondary | ICD-10-CM | POA: Diagnosis not present

## 2022-10-22 DIAGNOSIS — N815 Vaginal enterocele: Secondary | ICD-10-CM | POA: Diagnosis not present

## 2022-10-22 DIAGNOSIS — Z4689 Encounter for fitting and adjustment of other specified devices: Secondary | ICD-10-CM | POA: Diagnosis not present

## 2022-10-22 DIAGNOSIS — N898 Other specified noninflammatory disorders of vagina: Secondary | ICD-10-CM | POA: Diagnosis not present

## 2022-10-25 ENCOUNTER — Ambulatory Visit (INDEPENDENT_AMBULATORY_CARE_PROVIDER_SITE_OTHER): Payer: PPO | Admitting: Neurology

## 2022-10-25 ENCOUNTER — Encounter: Payer: Self-pay | Admitting: Neurology

## 2022-10-25 VITALS — BP 122/68 | HR 66 | Ht 64.5 in | Wt 166.0 lb

## 2022-10-25 DIAGNOSIS — H02402 Unspecified ptosis of left eyelid: Secondary | ICD-10-CM | POA: Diagnosis not present

## 2022-10-25 DIAGNOSIS — H532 Diplopia: Secondary | ICD-10-CM | POA: Diagnosis not present

## 2022-10-25 NOTE — Progress Notes (Signed)
Follow-up Visit   Date: 10/25/22   Sara [name] MRN: [id number] DOB: 25-Oct-1951   Interim History: Sara [name] is a 71 y.o. right-handed Caucasian female with GERD returning to the clinic for follow-up of diplopia.  The patient was accompanied to the clinic by self.  IMPRESSION/PLAN: Monocular diplopia with left ptosis (mild).  AChR negative and no improvement with mestinon. - Refer for single fiber EMG to evaluate for MG  2. History of ocular migraines  Further recommendations pending results.   ------------------------------------------------------------- History of present illness: Starting around 2018, she began having intermittent double vision, which is worse in low light, such as nighttime driving or when she is very tired.  Images are diagonal from each other. She stopped driving at night time because of this.   Double vision is resolved if she closes one eye.  She has some right eye fullness, but denies have droopy eyelids.  No difficulty with swallowing, talking, or limb weakness. Thyroid studies has been normal. CT of the orbit was normal.   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   She lives at home with husband.  Retired from [employer] work. 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   UPDATE 09/10/2021:  She tried taking mestinon '60mg'$  at 6p and she felt that maybe it helped for about a month.  She continued to take the medication, but did have constant benefit, so stopped it.  She continues to have intermittent double vision about 3-4 times per week, usually in the evening.  AChR antibodies were negative. No droopy eyelids, difficulty swallowing/talking, or weakness. Overall, symptoms have not progressed over the past few years.   UPDATE 10/25/2022:  She continues to have double vision with images diagonal from each other which is worse in the evening and in low light.   Previously, she used to experience this 3-4 times per week, but over the past year, it has started to occur every night, which she notices more when looking at far objects, such as watching TV.  No droopiness of the eyes, difficulty swallowing/talking, or limb weakness.  She has tried mestinon again, but did not appreciate any change.   Medications:  Current Outpatient Medications on File Prior to Visit  Medication Sig Dispense Refill   estradiol (ESTRACE) 0.1 MG/GM vaginal cream Place 0.5 g vaginally 2 (two) times a week.     famotidine (PEPCID) 40 MG tablet Take 1 tablet (40 mg total) by mouth daily. 90 tablet 3   Multiple Vitamin (MULTIVITAMIN ADULT PO) Take by mouth.     oxybutynin (DITROPAN-XL) 10 MG 24 hr tablet Take 1 tablet every day by oral route for 90 days.     alendronate (FOSAMAX) 70 MG tablet Take 70 mg by mouth once a week.     amoxicillin-clavulanate (AUGMENTIN) 875-125 MG tablet Take 1 tablet by mouth every 12 (twelve) hours. 14 tablet 0   Vibegron (GEMTESA) 75 MG TABS Take one tablet daily     No current facility-administered medications on file prior to visit.    Allergies:  Allergies  Allergen Reactions   Tramadol Nausea And Vomiting    Dizzy also    Vital Signs:  BP 122/68   Pulse 66   Ht 5' 4.5" (1.638 m)   Wt 166 lb (75.3 kg)   SpO2 97%   BMI 28.05 kg/m   Neurological Exam: MENTAL STATUS including orientation to time, place, person, recent and remote  memory, attention span and concentration, language, and fund of knowledge is normal.  Speech is not dysarthric.  CRANIAL NERVES:  No visual field defects.  Pupils equal round and reactive to light.  Normal conjugate, extra-ocular eye movements in all directions of gaze.  Left ptosis without sustained upgaze. Face is symmetric. Palate elevates symmetrically.  Tongue is midline.  Facial muscles are 5/5.   MOTOR:  Motor strength is 5/5 in all extremities, no fatigability.  No atrophy, fasciculations or abnormal  movements.  No pronator drift.  Tone is normal.    COORDINATION/GAIT:   Gait narrow based and stable.   Data: AChR antibody 06/18/2021:  Negative    Thank you for allowing me to participate in patient's care.  If I can answer any additional questions, I would be pleased to do so.    Sincerely,    Willys Salvino K. Posey Pronto, DO

## 2022-10-25 NOTE — Patient Instructions (Signed)
We will refer you for to [facility] in [place] for single fiber EMG  If you do not hear from them within 2-4 weeks to schedule appointment, please call my office

## 2022-12-04 ENCOUNTER — Telehealth: Payer: Self-pay

## 2022-12-04 NOTE — Telephone Encounter (Signed)
Error

## 2022-12-05 ENCOUNTER — Telehealth: Payer: Self-pay

## 2022-12-05 NOTE — Telephone Encounter (Signed)
[facility] and asked if they have received patients referral and they have. Stated that they just haven't contacted patient yet. I asked if it would be ok for patient to call them and get herself scheduled? And Amanda stated,Yes.   Called patient and informed her of above. Patient requested I send her the phone number in [place]. Message sent in [place] with [facility] phone number.

## 2023-01-10 DIAGNOSIS — H532 Diplopia: Secondary | ICD-10-CM | POA: Diagnosis not present

## 2023-01-22 DIAGNOSIS — N815 Vaginal enterocele: Secondary | ICD-10-CM | POA: Diagnosis not present

## 2023-01-22 DIAGNOSIS — Z4689 Encounter for fitting and adjustment of other specified devices: Secondary | ICD-10-CM | POA: Diagnosis not present

## 2023-01-23 DIAGNOSIS — H0520 Unspecified exophthalmos: Secondary | ICD-10-CM | POA: Diagnosis not present

## 2023-01-23 DIAGNOSIS — H524 Presbyopia: Secondary | ICD-10-CM | POA: Diagnosis not present

## 2023-01-23 DIAGNOSIS — H532 Diplopia: Secondary | ICD-10-CM | POA: Diagnosis not present

## 2023-01-23 LAB — HM DIABETES EYE EXAM

## 2023-02-09 ENCOUNTER — Other Ambulatory Visit: Payer: Self-pay

## 2023-02-09 ENCOUNTER — Encounter: Payer: Self-pay | Admitting: Emergency Medicine

## 2023-02-09 ENCOUNTER — Ambulatory Visit
Admission: EM | Admit: 2023-02-09 | Discharge: 2023-02-09 | Disposition: A | Discharge status: Home / Self Care | Payer: PPO | Attending: Internal Medicine | Admitting: Internal Medicine

## 2023-02-09 DIAGNOSIS — W5501XA Bitten by cat, initial encounter: Secondary | ICD-10-CM

## 2023-02-09 DIAGNOSIS — S61258A Open bite of other finger without damage to nail, initial encounter: Secondary | ICD-10-CM | POA: Diagnosis not present

## 2023-02-09 MED ORDER — AMOXICILLIN-POT CLAVULANATE 875-125 MG PO TABS: 1.0000 | ORAL_TABLET | Freq: Two times a day (BID) | ORAL | 0 refills | Status: DC

## 2023-02-09 NOTE — ED Triage Notes (Signed)
Pt here for cat bite to left hand that happened last night; known animal with current rabies vaccines; hand is painful and red

## 2023-02-09 NOTE — Discharge Instructions (Addendum)
Rest and elevate the affected painful area.  Apply cold compresses intermittently as needed.  As pain recedes, begin normal activities slowly as tolerated.  Call if symptoms persist.

## 2023-02-09 NOTE — ED Provider Notes (Signed)
[facility]    CSN: [id number] Arrival date & time: 02/09/23  0818      History   Chief Complaint Chief Complaint  Patient presents with   Animal Bite    HPI Sara [name] is a 72 y.o. female comes to the urgent care with cat bite of the left index finger.  Cat is fully vaccinated against rabies.  The bite was a provoked bite.  Patient is describing painful swelling of the left index finger.  Pain is of moderate severity.  Pain is throbbing and it is associated with erythema of the left index finger as well as swelling of the entire left index finger.  Puncture wound noticed over the PIP of the left index finger.  No fever or chills.  HPI  Past Medical History:  Diagnosis Date   Anemia    as child   ANXIETY    Cataract    bilateral-removed   Esophageal stricture    GERD (gastroesophageal reflux disease)    Osteoporosis    PLANTAR FASCIITIS, BILATERAL    PONV (postoperative nausea and vomiting)    SUPRAVENTRICULAR TACHYCARDIA    Urge incontinence     Patient Active Problem List   Diagnosis Date Noted   COVID 06/18/2022   Osteoporosis 12/24/2021   Bunion 04/17/2015   Hammer toe of left foot 04/17/2015   Routine general medical examination at a health care facility 01/13/2013   GERD (gastroesophageal reflux disease) 12/04/2012   URGE INCONTINENCE 03/01/2009    Past Surgical History:  Procedure Laterality Date   Adenosine Myoview  03/14/05   Cather ablation  04/2007   EYE SURGERY Bilateral 10-2010   cataracts with lens replacments   HERNIA REPAIR  15 yrs ago   NECK LIFT  05/2014   plastic  surgery for a neck lift   VAGINAL HYSTERECTOMY  yrs ago    OB History   No obstetric history on file.      Home Medications    Prior to Admission medications   Medication Sig Start Date End Date Taking? Authorizing Provider  amoxicillin-clavulanate (AUGMENTIN) 875-125 MG tablet Take 1 tablet by mouth every 12 (twelve) hours. 02/09/23  Yes Alpheus Stiff, Myrene Galas, MD  alendronate (FOSAMAX) 70 MG tablet Take 70 mg by mouth once a week. 09/28/21   [provider]  estradiol (ESTRACE) 0.1 MG/GM vaginal cream Place 0.5 g vaginally 2 (two) times a week. 03/19/22   [provider]  famotidine (PEPCID) 40 MG tablet Take 1 tablet (40 mg total) by mouth daily. 12/24/21   Hoyt Koch, MD  Multiple Vitamin (MULTIVITAMIN ADULT PO) Take by mouth.    [provider]  oxybutynin (DITROPAN-XL) 10 MG 24 hr tablet Take 1 tablet every day by oral route for 90 days. 09/24/22   [provider]  Vibegron (GEMTESA) 75 MG TABS Take one tablet daily 07/25/21   [provider]    Family History Family History  Problem Relation Age of Onset   Arthritis Mother    Hypertension Mother    Coronary artery disease Father    Heart disease Father    Breast cancer Maternal Grandmother    Stroke Paternal Grandmother    Coronary artery disease Paternal Grandfather    Stomach cancer Neg Hx    Colon cancer Neg Hx    Colon polyps Neg Hx    Esophageal cancer Neg Hx    Rectal cancer Neg Hx     Social History Social History  Tobacco Use   Smoking status: Never   Smokeless tobacco: Never  Vaping Use   Vaping Use: Never used  Substance Use Topics   Alcohol use: Yes    Alcohol/week: 1.0 standard drink of alcohol    Types: 1 Glasses of wine per week    Comment: occasionally   Drug use: No     Allergies   Tramadol   Review of Systems Review of Systems  Musculoskeletal:  Positive for arthralgias and joint swelling.  Skin:  Positive for color change and wound. Negative for rash.     Physical Exam Triage Vital Signs ED Triage Vitals  Enc Vitals Group     BP 02/09/23 0850 (!) 157/57     Pulse Rate 02/09/23 0850 75     Resp 02/09/23 0850 18     Temp 02/09/23 0850 98.5 F (36.9 C)     Temp Source 02/09/23 0850 Oral     SpO2 02/09/23 0850 95 %     Weight --      Height --      Head Circumference --      Peak  Flow --      Pain Score 02/09/23 0851 6     Pain Loc --      Pain Edu? --      Excl. in [place]? --    No data found.  Updated Vital Signs BP (!) 157/57 (BP Location: Left Arm)   Pulse 75   Temp 98.5 F (36.9 C) (Oral)   Resp 18   SpO2 95%   Visual Acuity Right Eye Distance:   Left Eye Distance:   Bilateral Distance:    Right Eye Near:   Left Eye Near:    Bilateral Near:     Physical Exam Vitals and nursing note reviewed.  Constitutional:      Appearance: Normal appearance.  Musculoskeletal:     Comments: Range of motion is limited by pain and swelling of the left index finger.  Erythema of the left index finger.  No discharge noted.  Capillary refill is less than 2 seconds in the left index finger.  Neurological:     Mental Status: She is alert.      UC Treatments / Results  Labs (all labs ordered are listed, but only abnormal results are displayed) Labs Reviewed - No data to display  EKG   Radiology No results found.  Procedures Procedures (including critical care time)  Medications Ordered in UC Medications - No data to display  Initial Impression / Assessment and Plan / UC Course  I have reviewed the triage vital signs and the nursing notes.  Pertinent labs & imaging results that were available during my care of the patient were reviewed by me and considered in my medical decision making (see chart for details).     1.  Cat bite of the left index finger: Cat is fully vaccinated against rabies Augmentin 875-125 mg twice daily for 7 days Ibuprofen as needed for pain Icing of the left index finger Return to urgent care if symptoms worsen. Final Clinical Impressions(s) / UC Diagnoses   Final diagnoses:  Cat bite of index finger, initial encounter     Discharge Instructions      Rest and elevate the affected painful area.   Apply cold compresses intermittently as needed.   As pain recedes, begin normal activities slowly as tolerated.   Call if  symptoms persist.    ED Prescriptions  Medication Sig Dispense Auth. Provider   amoxicillin-clavulanate (AUGMENTIN) 875-125 MG tablet Take 1 tablet by mouth every 12 (twelve) hours. 14 tablet Brittanya Winburn, Myrene Galas, MD      PDMP not reviewed this encounter.   Chase Picket, MD 02/09/23 [phone]

## 2023-02-14 ENCOUNTER — Telehealth (INDEPENDENT_AMBULATORY_CARE_PROVIDER_SITE_OTHER): Payer: PPO | Admitting: Nurse Practitioner

## 2023-02-14 DIAGNOSIS — S61251A Open bite of left index finger without damage to nail, initial encounter: Secondary | ICD-10-CM

## 2023-02-14 DIAGNOSIS — W5501XS Bitten by cat, sequela: Secondary | ICD-10-CM

## 2023-02-14 DIAGNOSIS — W5501XA Bitten by cat, initial encounter: Secondary | ICD-10-CM | POA: Insufficient documentation

## 2023-02-14 MED ORDER — AMOXICILLIN-POT CLAVULANATE 875-125 MG PO TABS: 1.0000 | ORAL_TABLET | Freq: Two times a day (BID) | ORAL | 0 refills | Status: DC

## 2023-02-14 NOTE — Progress Notes (Signed)
   Established Patient Office Visit  An audio/visual tele-health visit was completed today for this patient. I connected with  Sara [name] on 02/14/23 utilizing audio/visual technology and verified that I am speaking with the correct person using two identifiers. The patient was located at their home, and I was located at the office of [place] [facility] during the encounter. I discussed the limitations of evaluation and management by telemedicine. The patient expressed understanding and agreed to proceed.     Subjective   Patient ID: Sara [name], female    DOB: 07/13/1951  Age: 72 y.o. MRN: [id number]  Chief Complaint  Patient presents with   Animal Bite    Left hand index figure (amoxicillin which is about to run out ) went to urgent care, happened Saturday night and went to Sunday morning. Still swelling, can't bend. Oozing some pus. Milky green, clear sometimes Been soaking hot salt water      6 days ago her cat bit her left index finger. She cleaned the area thoroughly, but noticed swelling the day after the bite occurred. Went to Urgent Care and was treated with augmentin x 7 days. Cat is vaccinated against rabies. Reports that initially the swelling got worse, but has since been improving. She is concerned that she may need a longer course of the antibiotic as she will run out of it in 2 days. Pain is managed with OTC advil.     ROS: See HPI    Objective:     There were no vitals taken for this visit.   Physical Exam Comprehensive physical exam not completed today as office visit was conducted remotely.  Patient appears well on video. She does show me her finger, video quality somewhat grainy but can see redness and swelling of that finger. Patient is able to move finger. Patient was alert and oriented, and appeared to have appropriate judgment.   No results found for any visits on 02/14/23.    The 10-year ASCVD risk score (Arnett DK, et al., 2019)  is: 18.6%    Assessment & Plan:   Problem List Items Addressed This Visit       Other   Cat bite - Primary    Acute, infection from cat bite is improving, but I agree patient would benefit from additional course of augmentin. Additional 7 days sent to pharmacy patient educated to keep taking antibiotic until symptoms resolve and then to take for an additional 2 days. Told to proceed to ER if she experiences fever, worsening swelling, drainage, pain. She reports her understanding.       Relevant Medications   amoxicillin-clavulanate (AUGMENTIN) 875-125 MG tablet    Return if symptoms worsen or fail to improve.    Ailene Ards, NP

## 2023-02-14 NOTE — Assessment & Plan Note (Signed)
Acute, infection from cat bite is improving, but I agree patient would benefit from additional course of augmentin. Additional 7 days sent to pharmacy patient educated to keep taking antibiotic until symptoms resolve and then to take for an additional 2 days. Told to proceed to ER if she experiences fever, worsening swelling, drainage, pain. She reports her understanding.

## 2023-02-24 ENCOUNTER — Encounter: Payer: Self-pay | Admitting: Nurse Practitioner

## 2023-02-25 ENCOUNTER — Emergency Department (HOSPITAL_BASED_OUTPATIENT_CLINIC_OR_DEPARTMENT_OTHER): Payer: PPO | Admitting: Radiology

## 2023-02-25 ENCOUNTER — Other Ambulatory Visit: Payer: Self-pay

## 2023-02-25 ENCOUNTER — Encounter (HOSPITAL_BASED_OUTPATIENT_CLINIC_OR_DEPARTMENT_OTHER): Payer: Self-pay

## 2023-02-25 ENCOUNTER — Inpatient Hospital Stay (HOSPITAL_BASED_OUTPATIENT_CLINIC_OR_DEPARTMENT_OTHER)
Admission: EM | Admit: 2023-02-25 | Discharge: 2023-02-27 | DRG: 982 | Disposition: A | Discharge status: Home / Self Care | Payer: PPO | Attending: Internal Medicine | Admitting: Internal Medicine

## 2023-02-25 DIAGNOSIS — M810 Age-related osteoporosis without current pathological fracture: Secondary | ICD-10-CM | POA: Diagnosis present

## 2023-02-25 DIAGNOSIS — L089 Local infection of the skin and subcutaneous tissue, unspecified: Principal | ICD-10-CM

## 2023-02-25 DIAGNOSIS — Z823 Family history of stroke: Secondary | ICD-10-CM

## 2023-02-25 DIAGNOSIS — S61251D Open bite of left index finger without damage to nail, subsequent encounter: Secondary | ICD-10-CM | POA: Diagnosis not present

## 2023-02-25 DIAGNOSIS — I471 Supraventricular tachycardia, unspecified: Secondary | ICD-10-CM | POA: Diagnosis present

## 2023-02-25 DIAGNOSIS — Z8261 Family history of arthritis: Secondary | ICD-10-CM | POA: Diagnosis not present

## 2023-02-25 DIAGNOSIS — W5501XD Bitten by cat, subsequent encounter: Secondary | ICD-10-CM | POA: Diagnosis not present

## 2023-02-25 DIAGNOSIS — L03012 Cellulitis of left finger: Principal | ICD-10-CM | POA: Diagnosis present

## 2023-02-25 DIAGNOSIS — Z803 Family history of malignant neoplasm of breast: Secondary | ICD-10-CM | POA: Diagnosis not present

## 2023-02-25 DIAGNOSIS — Z8249 Family history of ischemic heart disease and other diseases of the circulatory system: Secondary | ICD-10-CM | POA: Diagnosis not present

## 2023-02-25 DIAGNOSIS — W5501XA Bitten by cat, initial encounter: Secondary | ICD-10-CM | POA: Diagnosis not present

## 2023-02-25 DIAGNOSIS — M869 Osteomyelitis, unspecified: Secondary | ICD-10-CM

## 2023-02-25 DIAGNOSIS — K219 Gastro-esophageal reflux disease without esophagitis: Secondary | ICD-10-CM | POA: Diagnosis present

## 2023-02-25 DIAGNOSIS — Z79899 Other long term (current) drug therapy: Secondary | ICD-10-CM | POA: Diagnosis not present

## 2023-02-25 DIAGNOSIS — Z7983 Long term (current) use of bisphosphonates: Secondary | ICD-10-CM

## 2023-02-25 DIAGNOSIS — S61251A Open bite of left index finger without damage to nail, initial encounter: Secondary | ICD-10-CM | POA: Diagnosis not present

## 2023-02-25 DIAGNOSIS — Z7409 Other reduced mobility: Secondary | ICD-10-CM | POA: Diagnosis present

## 2023-02-25 DIAGNOSIS — M86142 Other acute osteomyelitis, left hand: Secondary | ICD-10-CM | POA: Diagnosis not present

## 2023-02-25 DIAGNOSIS — Z885 Allergy status to narcotic agent status: Secondary | ICD-10-CM | POA: Diagnosis not present

## 2023-02-25 DIAGNOSIS — M1990 Unspecified osteoarthritis, unspecified site: Secondary | ICD-10-CM | POA: Diagnosis present

## 2023-02-25 DIAGNOSIS — M65842 Other synovitis and tenosynovitis, left hand: Secondary | ICD-10-CM | POA: Diagnosis present

## 2023-02-25 DIAGNOSIS — M7989 Other specified soft tissue disorders: Secondary | ICD-10-CM | POA: Diagnosis not present

## 2023-02-25 DIAGNOSIS — F419 Anxiety disorder, unspecified: Secondary | ICD-10-CM | POA: Diagnosis present

## 2023-02-25 LAB — CBC WITH DIFFERENTIAL/PLATELET
Abs Immature Granulocytes: 0.02 10*3/uL (ref 0.00–0.07)
Basophils Absolute: 0 10*3/uL (ref 0.0–0.1)
Basophils Relative: 1 %
Eosinophils Absolute: 0.2 10*3/uL (ref 0.0–0.5)
Eosinophils Relative: 3 %
HCT: 40.8 % (ref 36.0–46.0)
Hemoglobin: 13.6 g/dL (ref 12.0–15.0)
Immature Granulocytes: 0 %
Lymphocytes Relative: 35 %
Lymphs Abs: 2 10*3/uL (ref 0.7–4.0)
MCH: 30.9 pg (ref 26.0–34.0)
MCHC: 33.3 g/dL (ref 30.0–36.0)
MCV: 92.7 fL (ref 80.0–100.0)
Monocytes Absolute: 0.5 10*3/uL (ref 0.1–1.0)
Monocytes Relative: 8 %
Neutro Abs: 3.1 10*3/uL (ref 1.7–7.7)
Neutrophils Relative %: 53 %
Platelets: 240 10*3/uL (ref 150–400)
RBC: 4.4 MIL/uL (ref 3.87–5.11)
RDW: 12.4 % (ref 11.5–15.5)
WBC: 5.8 10*3/uL (ref 4.0–10.5)
nRBC: 0 % (ref 0.0–0.2)

## 2023-02-25 LAB — BASIC METABOLIC PANEL
Anion gap: 7 (ref 5–15)
BUN: 14 mg/dL (ref 8–23)
CO2: 29 mmol/L (ref 22–32)
Calcium: 10 mg/dL (ref 8.9–10.3)
Chloride: 103 mmol/L (ref 98–111)
Creatinine, Ser: 0.65 mg/dL (ref 0.44–1.00)
GFR, Estimated: >60 mL/min (ref >60)
Glucose, Bld: 86 mg/dL (ref 70–99)
Potassium: 4.4 mmol/L (ref 3.5–5.1)
Sodium: 139 mmol/L (ref 135–145)

## 2023-02-25 LAB — C-REACTIVE PROTEIN: CRP: 0.6 mg/dL (ref <1.0)

## 2023-02-25 LAB — SEDIMENTATION RATE: Sed Rate: 38 mm/hr — ABNORMAL HIGH (ref 0–22)

## 2023-02-25 MED ORDER — VANCOMYCIN HCL 1250 MG/250ML IV SOLN
1250.0000 mg | INTRAVENOUS | Status: DC
  Administered 2023-02-26 – 2023-02-27 (×2): 1250 mg via INTRAVENOUS
  Filled 2023-02-25 (×3): qty 250

## 2023-02-25 MED ORDER — LACTATED RINGERS IV BOLUS
1000.0000 mL | Freq: Once | INTRAVENOUS | Status: AC
  Administered 2023-02-25: 1000 mL via INTRAVENOUS

## 2023-02-25 MED ORDER — SODIUM CHLORIDE 0.9 % IV SOLN
2.0000 g | Freq: Three times a day (TID) | INTRAVENOUS | Status: DC
  Administered 2023-02-26 – 2023-02-27 (×4): 2 g via INTRAVENOUS
  Filled 2023-02-25 (×4): qty 12.5

## 2023-02-25 MED ORDER — VANCOMYCIN HCL IN DEXTROSE 1-5 GM/200ML-% IV SOLN
1000.0000 mg | Freq: Once | INTRAVENOUS | Status: AC
  Administered 2023-02-25: 1000 mg via INTRAVENOUS
  Filled 2023-02-25: qty 200

## 2023-02-25 MED ORDER — VANCOMYCIN HCL 500 MG IV SOLR
500.0000 mg | Freq: Once | INTRAVENOUS | Status: AC
  Administered 2023-02-25: 500 mg via INTRAVENOUS

## 2023-02-25 MED ORDER — SODIUM CHLORIDE 0.9 % IV SOLN
2.0000 g | Freq: Once | INTRAVENOUS | Status: AC
  Administered 2023-02-25: 2 g via INTRAVENOUS
  Filled 2023-02-25: qty 12.5

## 2023-02-25 NOTE — Progress Notes (Signed)
Pharmacy Antibiotic Note  Sara [name] is a 72 y.o. female admitted on 02/25/2023 with  possible osteomyelitis  s/p recent cat bite. Pharmacy has been consulted for vancomycin and cefepime dosing. Pt recently on a course of augmentin. Pt is afebrile and WBC is WNL. Scr is WNL.   Plan: Vanc 1500mg  IV x 1 then 1250mg  IV Q24H  Cefepime 2g IV Q8H F/u renal fxn, C&S, clinical status and peak/trough at [facility] De-escalate as able  Height: 5\' 5"  (165.1 cm) Weight: 72.6 kg (160 lb) IBW/kg (Calculated) : 57  Temp (24hrs), Avg:98.4 F (36.9 C), Min:98.4 F (36.9 C), Max:98.4 F (36.9 C)  Recent Labs  Lab 02/25/23 1511  WBC 5.8  CREATININE 0.65    Estimated Creatinine Clearance: 62.5 mL/min (by C-G formula based on SCr of 0.65 mg/dL).    Allergies  Allergen Reactions   Tramadol Nausea And Vomiting    Dizzy also    Antimicrobials this admission: Vanc 4/2>> Cefepime 4/2>>  Dose adjustments this admission: N/A  Microbiology results: Pending  Thank you for allowing pharmacy to be a part of this patient's care.  Anira Senegal, Rande Lawman 02/25/2023 3:53 PM

## 2023-02-25 NOTE — Discharge Instructions (Signed)
Your XR imaging was concerning for osteomyelitis of the finger. We are starting you on antibiotics due to this concern. You are being transported to [facility] for hand surgery consultation.

## 2023-02-25 NOTE — ED Triage Notes (Signed)
Patient arrives to ED POV C/O Left Index finger pain. Per pt she was bit by a cat x2 weeks ago and was prescribed antibiotics and pt now has swelling to Left index finger, redness and painful to touch. No other complaints at this time.

## 2023-02-25 NOTE — ED Notes (Signed)
Called [facility] -- informed that the patient Bed Assignment is Ready 

## 2023-02-25 NOTE — ED Provider Notes (Signed)
[facility] Provider Note   CSN: [id number] Arrival date & time: 02/25/23  1200     History  Chief Complaint  Patient presents with   Animal Bite    Sara [name] is a 72 y.o. female.   Animal Bite Patient presents with unresolved swelling and tenderness of left index finger which she has completed 2 rounds of Augmentin (total 14 days).  She endorses recent sloughing of the skin however swelling and redness have improved.  Denies fever.  She has been unable to bend the to distal joints of that finger without significant pain.  She endorses completing both rounds of antibiotics and that the original cat bite was from her house cat that is fully vaccinated.  She is unaware if the cat is missing any teeth after that bite.     Home Medications Prior to Admission medications   Medication Sig Start Date End Date Taking? Authorizing Provider  alendronate (FOSAMAX) 70 MG tablet Take 70 mg by mouth once a week. 09/28/21   [provider]  amoxicillin-clavulanate (AUGMENTIN) 875-125 MG tablet Take 1 tablet by mouth every 12 (twelve) hours. 02/14/23   Ailene Ards, NP  estradiol (ESTRACE) 0.1 MG/GM vaginal cream Place 0.5 g vaginally 2 (two) times a week. 03/19/22   [provider]  famotidine (PEPCID) 40 MG tablet Take 1 tablet (40 mg total) by mouth daily. 12/24/21   Hoyt Koch, MD  Multiple Vitamin (MULTIVITAMIN ADULT PO) Take by mouth.    [provider]  oxybutynin (DITROPAN-XL) 10 MG 24 hr tablet Take 1 tablet every day by oral route for 90 days. 09/24/22   [provider]  Vibegron (GEMTESA) 75 MG TABS Take one tablet daily 07/25/21   [provider]      Allergies    Tramadol    Review of Systems   Review of Systems  All other systems reviewed and are negative.  Physical Exam Updated Vital Signs BP (!) 160/60   Pulse (!) 58   Temp 98.4 F (36.9 C) (Oral)   Resp 17   Ht 5\' 5"   (1.651 m)   Wt 72.6 kg   SpO2 98%   BMI 26.63 kg/m  General: Well-appearing, NAD Left index finger: Swelling and significant tenderness tenderness over left index finger PIP joint, unable to flex DIP or PIP without significant tenderness and guarding, sloughing of skin up to MCP of affected digit      ED Results / Procedures / Treatments   Labs (all labs ordered are listed, but only abnormal results are displayed) Labs Reviewed  BASIC METABOLIC PANEL  CBC WITH DIFFERENTIAL/PLATELET    EKG None  Radiology No results found.  Procedures Procedures    Medications Ordered in ED Medications - No data to display  ED Course/ Medical Decision Making/ A&P                             Medical Decision Making  72 year old female presenting for continued management after cat bite of left index finger around PIP joint.  Concerning for septic joint, foreign body, osteomyelitis.  Upon chart review, patient was seen in urgent care on 02/09/2023 for cat bite of left index finger.  Patient was provided Augmentin 875-125 mg twice daily x 7 days.  She was then seen on 02/14/2023 and prescribed additional 7 days.  Given concern, discussed with hand surgery Hilbert Odor, PA.  He advised transfer to [facility] for evaluation by hand surgery.  Discussed with [facility] and placed transfer order.        Final Clinical Impression(s) / ED Diagnoses Final diagnoses:  None    Rx / DC Orders ED Discharge Orders     None         Wells Guiles, DO 02/25/23 1522    Regan Lemming, MD 02/25/23 1614    Regan Lemming, MD 02/25/23 1615    Regan Lemming, MD 02/26/23 [phone]

## 2023-02-25 NOTE — ED Notes (Signed)
[facility] at bedside to transport pt to  

## 2023-02-26 ENCOUNTER — Encounter (HOSPITAL_COMMUNITY): Payer: Self-pay | Admitting: Student

## 2023-02-26 ENCOUNTER — Other Ambulatory Visit: Payer: Self-pay

## 2023-02-26 ENCOUNTER — Inpatient Hospital Stay (HOSPITAL_COMMUNITY): Payer: PPO | Admitting: Anesthesiology

## 2023-02-26 ENCOUNTER — Encounter (HOSPITAL_COMMUNITY)
Admission: EM | Disposition: A | Discharge status: Home / Self Care | Payer: Self-pay | Source: Home / Self Care | Attending: Internal Medicine

## 2023-02-26 DIAGNOSIS — Z885 Allergy status to narcotic agent status: Secondary | ICD-10-CM | POA: Diagnosis not present

## 2023-02-26 DIAGNOSIS — Z7409 Other reduced mobility: Secondary | ICD-10-CM | POA: Diagnosis present

## 2023-02-26 DIAGNOSIS — Z79899 Other long term (current) drug therapy: Secondary | ICD-10-CM | POA: Diagnosis not present

## 2023-02-26 DIAGNOSIS — M65842 Other synovitis and tenosynovitis, left hand: Secondary | ICD-10-CM | POA: Diagnosis present

## 2023-02-26 DIAGNOSIS — I471 Supraventricular tachycardia, unspecified: Secondary | ICD-10-CM | POA: Diagnosis present

## 2023-02-26 DIAGNOSIS — F419 Anxiety disorder, unspecified: Secondary | ICD-10-CM | POA: Diagnosis present

## 2023-02-26 DIAGNOSIS — L03012 Cellulitis of left finger: Secondary | ICD-10-CM | POA: Diagnosis present

## 2023-02-26 DIAGNOSIS — Z7983 Long term (current) use of bisphosphonates: Secondary | ICD-10-CM | POA: Diagnosis not present

## 2023-02-26 DIAGNOSIS — W5501XA Bitten by cat, initial encounter: Secondary | ICD-10-CM | POA: Diagnosis not present

## 2023-02-26 DIAGNOSIS — K219 Gastro-esophageal reflux disease without esophagitis: Secondary | ICD-10-CM | POA: Diagnosis present

## 2023-02-26 DIAGNOSIS — M810 Age-related osteoporosis without current pathological fracture: Secondary | ICD-10-CM | POA: Diagnosis present

## 2023-02-26 DIAGNOSIS — S61251D Open bite of left index finger without damage to nail, subsequent encounter: Secondary | ICD-10-CM | POA: Diagnosis not present

## 2023-02-26 DIAGNOSIS — Z8261 Family history of arthritis: Secondary | ICD-10-CM | POA: Diagnosis not present

## 2023-02-26 DIAGNOSIS — W5501XD Bitten by cat, subsequent encounter: Secondary | ICD-10-CM | POA: Diagnosis not present

## 2023-02-26 DIAGNOSIS — M1990 Unspecified osteoarthritis, unspecified site: Secondary | ICD-10-CM | POA: Diagnosis present

## 2023-02-26 DIAGNOSIS — Z803 Family history of malignant neoplasm of breast: Secondary | ICD-10-CM | POA: Diagnosis not present

## 2023-02-26 DIAGNOSIS — Z8249 Family history of ischemic heart disease and other diseases of the circulatory system: Secondary | ICD-10-CM | POA: Diagnosis not present

## 2023-02-26 DIAGNOSIS — S61251A Open bite of left index finger without damage to nail, initial encounter: Secondary | ICD-10-CM

## 2023-02-26 DIAGNOSIS — Z823 Family history of stroke: Secondary | ICD-10-CM | POA: Diagnosis not present

## 2023-02-26 HISTORY — PX: I & D EXTREMITY: SHX5045

## 2023-02-26 LAB — CBC
HCT: 38.8 % (ref 36.0–46.0)
Hemoglobin: 13.3 g/dL (ref 12.0–15.0)
MCH: 31.4 pg (ref 26.0–34.0)
MCHC: 34.3 g/dL (ref 30.0–36.0)
MCV: 91.5 fL (ref 80.0–100.0)
Platelets: 229 10*3/uL (ref 150–400)
RBC: 4.24 MIL/uL (ref 3.87–5.11)
RDW: 12.3 % (ref 11.5–15.5)
WBC: 5.9 10*3/uL (ref 4.0–10.5)
nRBC: 0 % (ref 0.0–0.2)

## 2023-02-26 LAB — BASIC METABOLIC PANEL
Anion gap: 9 (ref 5–15)
BUN: 10 mg/dL (ref 8–23)
CO2: 23 mmol/L (ref 22–32)
Calcium: 8.9 mg/dL (ref 8.9–10.3)
Chloride: 104 mmol/L (ref 98–111)
Creatinine, Ser: 0.69 mg/dL (ref 0.44–1.00)
GFR, Estimated: >60 mL/min (ref >60)
Glucose, Bld: 95 mg/dL (ref 70–99)
Potassium: 4.2 mmol/L (ref 3.5–5.1)
Sodium: 136 mmol/L (ref 135–145)

## 2023-02-26 LAB — MRSA NEXT GEN BY PCR, NASAL: MRSA by PCR Next Gen: NOT DETECTED

## 2023-02-26 SURGERY — IRRIGATION AND DEBRIDEMENT EXTREMITY
Anesthesia: General | Laterality: Left

## 2023-02-26 MED ORDER — LIDOCAINE 2% (20 MG/ML) 5 ML SYRINGE
INTRAMUSCULAR | Status: AC
  Filled 2023-02-26: qty 5

## 2023-02-26 MED ORDER — FENTANYL CITRATE (PF) 250 MCG/5ML IJ SOLN
INTRAMUSCULAR | Status: AC
  Filled 2023-02-26: qty 5

## 2023-02-26 MED ORDER — OXYCODONE HCL 5 MG PO TABS: 5.0000 mg | ORAL_TABLET | Freq: Once | ORAL | Status: DC | PRN

## 2023-02-26 MED ORDER — BUPIVACAINE HCL (PF) 0.25 % IJ SOLN
INTRAMUSCULAR | Status: AC
  Filled 2023-02-26: qty 30

## 2023-02-26 MED ORDER — LIDOCAINE 2% (20 MG/ML) 5 ML SYRINGE
INTRAMUSCULAR | Status: DC | PRN
  Administered 2023-02-26: 20 mg via INTRAVENOUS

## 2023-02-26 MED ORDER — MIDAZOLAM HCL 2 MG/2ML IJ SOLN: 0.5000 mg | Freq: Once | INTRAMUSCULAR | Status: DC | PRN

## 2023-02-26 MED ORDER — CHLORHEXIDINE GLUCONATE 0.12 % MT SOLN: 15.0000 mL | Freq: Once | OROMUCOSAL | Status: AC

## 2023-02-26 MED ORDER — ONDANSETRON HCL 4 MG PO TABS: 4.0000 mg | ORAL_TABLET | Freq: Four times a day (QID) | ORAL | Status: DC | PRN

## 2023-02-26 MED ORDER — ORAL CARE MOUTH RINSE: 15.0000 mL | Freq: Once | OROMUCOSAL | Status: AC

## 2023-02-26 MED ORDER — ACETAMINOPHEN 10 MG/ML IV SOLN
INTRAVENOUS | Status: DC | PRN
  Administered 2023-02-26: 1000 mg via INTRAVENOUS

## 2023-02-26 MED ORDER — SODIUM CHLORIDE 0.9 % IR SOLN: Status: DC | PRN

## 2023-02-26 MED ORDER — ACETAMINOPHEN 650 MG RE SUPP: 650.0000 mg | Freq: Four times a day (QID) | RECTAL | Status: DC | PRN

## 2023-02-26 MED ORDER — ACETAMINOPHEN 10 MG/ML IV SOLN
INTRAVENOUS | Status: AC
  Filled 2023-02-26: qty 100

## 2023-02-26 MED ORDER — MIDAZOLAM HCL 2 MG/2ML IJ SOLN
INTRAMUSCULAR | Status: DC | PRN
  Administered 2023-02-26: 2 mg via INTRAVENOUS

## 2023-02-26 MED ORDER — DEXAMETHASONE SODIUM PHOSPHATE 10 MG/ML IJ SOLN
INTRAMUSCULAR | Status: DC | PRN
  Administered 2023-02-26: 10 mg via INTRAVENOUS

## 2023-02-26 MED ORDER — ONDANSETRON HCL 4 MG/2ML IJ SOLN
INTRAMUSCULAR | Status: DC | PRN
  Administered 2023-02-26: 4 mg via INTRAVENOUS

## 2023-02-26 MED ORDER — 0.9 % SODIUM CHLORIDE (POUR BTL) OPTIME
TOPICAL | Status: DC | PRN
  Administered 2023-02-26: 1000 mL

## 2023-02-26 MED ORDER — PROPOFOL 10 MG/ML IV BOLUS
INTRAVENOUS | Status: DC | PRN
  Administered 2023-02-26: 100 mg via INTRAVENOUS

## 2023-02-26 MED ORDER — ACETAMINOPHEN 325 MG PO TABS: 650.0000 mg | ORAL_TABLET | Freq: Four times a day (QID) | ORAL | Status: DC | PRN

## 2023-02-26 MED ORDER — MIDAZOLAM HCL 2 MG/2ML IJ SOLN
INTRAMUSCULAR | Status: AC
  Filled 2023-02-26: qty 2

## 2023-02-26 MED ORDER — ONDANSETRON HCL 4 MG/2ML IJ SOLN: 4.0000 mg | Freq: Four times a day (QID) | INTRAMUSCULAR | Status: DC | PRN

## 2023-02-26 MED ORDER — MEPERIDINE HCL 25 MG/ML IJ SOLN: 6.2500 mg | INTRAMUSCULAR | Status: DC | PRN

## 2023-02-26 MED ORDER — FENTANYL CITRATE (PF) 100 MCG/2ML IJ SOLN: 25.0000 ug | INTRAMUSCULAR | Status: DC | PRN

## 2023-02-26 MED ORDER — DEXAMETHASONE SODIUM PHOSPHATE 10 MG/ML IJ SOLN
INTRAMUSCULAR | Status: AC
  Filled 2023-02-26: qty 1

## 2023-02-26 MED ORDER — CHLORHEXIDINE GLUCONATE 0.12 % MT SOLN
OROMUCOSAL | Status: AC
  Administered 2023-02-26: 15 mL via OROMUCOSAL
  Filled 2023-02-26: qty 15

## 2023-02-26 MED ORDER — ONDANSETRON HCL 4 MG/2ML IJ SOLN
INTRAMUSCULAR | Status: AC
  Filled 2023-02-26: qty 2

## 2023-02-26 MED ORDER — BUPIVACAINE HCL (PF) 0.25 % IJ SOLN
INTRAMUSCULAR | Status: DC | PRN
  Administered 2023-02-26: 9 mL

## 2023-02-26 MED ORDER — LACTATED RINGERS IV SOLN: INTRAVENOUS | Status: DC

## 2023-02-26 MED ORDER — PROPOFOL 10 MG/ML IV BOLUS
INTRAVENOUS | Status: AC
  Filled 2023-02-26: qty 20

## 2023-02-26 MED ORDER — OXYCODONE HCL 5 MG/5ML PO SOLN: 5.0000 mg | Freq: Once | ORAL | Status: DC | PRN

## 2023-02-26 MED ORDER — PROMETHAZINE HCL 25 MG/ML IJ SOLN: 6.2500 mg | INTRAMUSCULAR | Status: DC | PRN

## 2023-02-26 SURGICAL SUPPLY — 45 items
BAG COUNTER SPONGE SURGICOUNT (BAG) ×1 IMPLANT
BAG DECANTER FOR FLEXI CONT (MISCELLANEOUS) ×1 IMPLANT
BAG SPNG CNTER NS LX DISP (BAG) ×1
BNDG CMPR 5X3 KNIT ELC UNQ LF (GAUZE/BANDAGES/DRESSINGS)
BNDG COHESIVE 1X5 TAN STRL LF (GAUZE/BANDAGES/DRESSINGS) IMPLANT
BNDG ELASTIC 3INX 5YD STR LF (GAUZE/BANDAGES/DRESSINGS) IMPLANT
BNDG ELASTIC 4X5.8 VLCR STR LF (GAUZE/BANDAGES/DRESSINGS) IMPLANT
BNDG GAUZE DERMACEA FLUFF 4 (GAUZE/BANDAGES/DRESSINGS) IMPLANT
BNDG GZE DERMACEA 4 6PLY (GAUZE/BANDAGES/DRESSINGS)
CORD BIPOLAR FORCEPS 12FT (ELECTRODE) IMPLANT
CUFF TOURN SGL QUICK 18X4 (TOURNIQUET CUFF) IMPLANT
DRAPE SURG 17X23 STRL (DRAPES) ×1 IMPLANT
ELECT REM PT RETURN 9FT ADLT (ELECTROSURGICAL)
ELECTRODE REM PT RTRN 9FT ADLT (ELECTROSURGICAL) IMPLANT
GAUZE PACKING IODOFORM 1/4X15 (PACKING) IMPLANT
GAUZE SPONGE 4X4 12PLY STRL (GAUZE/BANDAGES/DRESSINGS) ×1 IMPLANT
GAUZE XEROFORM 1X8 LF (GAUZE/BANDAGES/DRESSINGS) ×1 IMPLANT
GLOVE BIOGEL M 8.0 STRL (GLOVE) ×1 IMPLANT
GOWN STRL REUS W/ TWL LRG LVL3 (GOWN DISPOSABLE) ×2 IMPLANT
GOWN STRL REUS W/TWL LRG LVL3 (GOWN DISPOSABLE) ×2
HANDPIECE INTERPULSE COAX TIP (DISPOSABLE)
IV NS IRRIG 3000ML ARTHROMATIC (IV SOLUTION) IMPLANT
KIT BASIN OR (CUSTOM PROCEDURE TRAY) ×1 IMPLANT
KIT TURNOVER KIT B (KITS) ×1 IMPLANT
MANIFOLD NEPTUNE II (INSTRUMENTS) ×1 IMPLANT
NDL HYPO 25GX1X1/2 BEV (NEEDLE) IMPLANT
NEEDLE HYPO 25GX1X1/2 BEV (NEEDLE) IMPLANT
NS IRRIG 1000ML POUR BTL (IV SOLUTION) ×1 IMPLANT
PACK ORTHO EXTREMITY (CUSTOM PROCEDURE TRAY) ×1 IMPLANT
PAD ARMBOARD 7.5X6 YLW CONV (MISCELLANEOUS) ×2 IMPLANT
PAD CAST 4YDX4 CTTN HI CHSV (CAST SUPPLIES) IMPLANT
PADDING CAST COTTON 4X4 STRL (CAST SUPPLIES)
SET CYSTO W/LG BORE CLAMP LF (SET/KITS/TRAYS/PACK) IMPLANT
SET HNDPC FAN SPRY TIP SCT (DISPOSABLE) IMPLANT
SOAP 2 % CHG 4 OZ (WOUND CARE) ×1 IMPLANT
SPONGE T-LAP 18X18 ~~LOC~~+RFID (SPONGE) IMPLANT
SPONGE T-LAP 4X18 ~~LOC~~+RFID (SPONGE) ×1 IMPLANT
SUT ETHILON 5 0 PS 2 18 (SUTURE) IMPLANT
SWAB CULTURE ESWAB REG 1ML (MISCELLANEOUS) IMPLANT
SYR CONTROL 10ML LL (SYRINGE) IMPLANT
TOWEL GREEN STERILE (TOWEL DISPOSABLE) ×1 IMPLANT
TOWEL GREEN STERILE FF (TOWEL DISPOSABLE) ×1 IMPLANT
TUBE CONNECTING 12X1/4 (SUCTIONS) ×1 IMPLANT
WATER STERILE IRR 1000ML POUR (IV SOLUTION) ×1 IMPLANT
YANKAUER SUCT BULB TIP NO VENT (SUCTIONS) ×1 IMPLANT

## 2023-02-26 NOTE — H&P (Signed)
Reason for Consult:cat bite to L index finger/infection Referring Physician: ER/hospitalist  CC:My cat bit me  HPI:  Sara [name] is an 72 y.o. right handed female who presents with   continued redness, swelling, pain, loss of motion to LIF.  She was bitten by cat on dorsal surface; presented to ER, has been on 2 rounds of antibiotics and still has symptoms/signs of infection.     .   Pain is rated at   6 /10 and is described as sharp/dull.  Pain is intermittent mostly with motion.  Pain is made better by rest/immobilization, worse with motion.   Associated signs/symptoms: denies Previous treatment:  antibiotics x 2 Augmentin  Past Medical History:  Diagnosis Date   Anemia    as child   ANXIETY    Cataract    bilateral-removed   Esophageal stricture    GERD (gastroesophageal reflux disease)    Osteoporosis    PLANTAR FASCIITIS, BILATERAL    PONV (postoperative nausea and vomiting)    SUPRAVENTRICULAR TACHYCARDIA    Urge incontinence     Past Surgical History:  Procedure Laterality Date   Adenosine Myoview  03/14/05   Cather ablation  04/2007   EYE SURGERY Bilateral 10-2010   cataracts with lens replacments   HERNIA REPAIR  15 yrs ago   NECK LIFT  05/2014   plastic  surgery for a neck lift   VAGINAL HYSTERECTOMY  yrs ago    Family History  Problem Relation Age of Onset   Arthritis Mother    Hypertension Mother    Coronary artery disease Father    Heart disease Father    Breast cancer Maternal Grandmother    Stroke Paternal Grandmother    Coronary artery disease Paternal Grandfather    Stomach cancer Neg Hx    Colon cancer Neg Hx    Colon polyps Neg Hx    Esophageal cancer Neg Hx    Rectal cancer Neg Hx     Social History:  reports that she has never smoked. She has never used smokeless tobacco. She reports current alcohol use of about 1.0 standard drink of alcohol per week. She reports that she does not use drugs.  Allergies:  Allergies  Allergen Reactions    Tramadol Nausea And Vomiting    Dizzy also    Medications: I have reviewed the patient's current medications.  Results for orders placed or performed during the hospital encounter of 02/25/23 (from the past 48 hour(s))  Basic metabolic panel     Status: None   Collection Time: 02/25/23  3:11 PM  Result Value Ref Range   Sodium 139 135 - 145 mmol/L   Potassium 4.4 3.5 - 5.1 mmol/L   Chloride 103 98 - 111 mmol/L   CO2 29 22 - 32 mmol/L   Glucose, Bld 86 70 - 99 mg/dL    Comment: Glucose reference range applies only to samples taken after fasting for at least 8 hours.   BUN 14 8 - 23 mg/dL   Creatinine, Ser 0.65 0.44 - 1.00 mg/dL   Calcium 10.0 8.9 - 10.3 mg/dL   GFR, Estimated >60 >60 mL/min    Comment: (NOTE) Calculated using the CKD-EPI Creatinine Equation (2021)    Anion gap 7 5 - 15    Comment: Performed at [facility], [address] [place], [place] [address]  CBC with Differential     Status: None   Collection Time: 02/25/23  3:11 PM  Result Value Ref Range  WBC 5.8 4.0 - 10.5 K/uL   RBC 4.40 3.87 - 5.11 MIL/uL   Hemoglobin 13.6 12.0 - 15.0 g/dL   HCT 40.8 36.0 - 46.0 %   MCV 92.7 80.0 - 100.0 fL   MCH 30.9 26.0 - 34.0 pg   MCHC 33.3 30.0 - 36.0 g/dL   RDW 12.4 11.5 - 15.5 %   Platelets 240 150 - 400 K/uL   nRBC 0.0 0.0 - 0.2 %   Neutrophils Relative % 53 %   Neutro Abs 3.1 1.7 - 7.7 K/uL   Lymphocytes Relative 35 %   Lymphs Abs 2.0 0.7 - 4.0 K/uL   Monocytes Relative 8 %   Monocytes Absolute 0.5 0.1 - 1.0 K/uL   Eosinophils Relative 3 %   Eosinophils Absolute 0.2 0.0 - 0.5 K/uL   Basophils Relative 1 %   Basophils Absolute 0.0 0.0 - 0.1 K/uL   Immature Granulocytes 0 %   Abs Immature Granulocytes 0.02 0.00 - 0.07 K/uL    Comment: Performed at [facility], [address], [place], [place] [address]  Sedimentation rate     Status: Abnormal   Collection Time: 02/25/23  4:11 PM  Result Value Ref Range   Sed Rate 38  (H) 0 - 22 mm/hr    Comment: Performed at [facility], [place], [place] [address]  C-reactive protein     Status: None   Collection Time: 02/25/23  4:11 PM  Result Value Ref Range   CRP 0.6 <1.0 mg/dL    Comment: Performed at [facility], [address] [place], [place] [address]  Blood culture (routine x 2)     Status: None (Preliminary result)   Collection Time: 02/25/23  4:11 PM   Specimen: BLOOD LEFT ARM  Result Value Ref Range   Specimen Description      BLOOD LEFT ARM Performed at [facility] [address], [place], [place] [address]    Special Requests      Blood Culture adequate volume BOTTLES DRAWN AEROBIC AND ANAEROBIC Performed at [facility], [address], [place], [place] [address]    Culture      NO GROWTH < 24 HOURS Performed at [facility], [place] [address] [place], [place] [address]    Report Status PENDING   MRSA Next Gen by PCR, Nasal     Status: None   Collection Time: 02/26/23 12:38 AM   Specimen: Nasal Mucosa; Nasal Swab  Result Value Ref Range   MRSA by PCR Next Gen NOT DETECTED NOT DETECTED    Comment: (NOTE) The GeneXpert MRSA Assay (FDA approved for NASAL specimens only), is one component of a comprehensive MRSA colonization surveillance program. It is not intended to diagnose MRSA infection nor to guide or monitor treatment for MRSA infections. Test performance is not FDA approved in patients less than 73 years old. Performed at [facility], [place] [address], [place], [place] [address]     DG Finger Index Left  Result Date: 02/25/2023 CLINICAL DATA:  Cat bite.  Second digit EXAM: LEFT INDEX FINGER 3V COMPARISON:  None Available. FINDINGS: Osteopenia. Soft tissue swelling identified particularly along the proximal interphalangeal joint. No acute fracture or dislocation. No radiopaque foreign body. There is subtle cortical lucency along the base of the middle  phalanx of the second digit laterally. Small area of bony erosion is possible. IMPRESSION: Soft tissue swelling with subtle erosive appearance of the proximal  interphalangeal joint. This has a differential including focal infection/osteomyelitis with the history of cat bite. Electronically Signed   By: Jill Side M.D.   On: 02/25/2023 15:21    Pertinent items noted in HPI and remainder of comprehensive ROS otherwise negative. Temp:  [97.7 F (36.5 C)-98.6 F (37 C)] 97.7 F (36.5 C) (04/03 0823) Pulse Rate:  [55-64] 58 (04/03 0823) Resp:  [16-18] 16 (04/03 0823) BP: (108-165)/(51-95) 147/57 (04/03 0823) SpO2:  [93 %-98 %] 96 % (04/03 0823) Weight:  [72.6 kg] 72.6 kg (04/02 1226) General appearance: alert and cooperative Resp: clear to auscultation bilaterally Cardio: regular rate and rhythm GI: soft, non-tender; bowel sounds normal; no masses,  no organomegaly Extremities: LIF with redness, swelling around PIPJ, loss of flexion of finger, pain with attempted motion, sensation wnl, no open wound, but erythema centered around dorsal ulnar pip   Assessment: Probable pipj joint infection from cat bite Plan: Will plan OR joint waashout I have discussed this treatment plan in detail with patient, including the risks of the recommended treatment or surgery, the benefits and the alternatives.  The patient understands that additional treatment may be necessary.  Kazi Montoro C Xander Jutras 02/26/2023, 10:31 AM

## 2023-02-26 NOTE — H&P (Signed)
Reason for Consult:cat bite to L index finger/infection Referring Physician: ER/hospitalist  CC:My cat bit me  HPI:  Sara [name] is an 72 y.o. right handed female who presents with   continued redness, swelling, pain, loss of motion to LIF.  She was bitten by cat on dorsal surface; presented to ER, has been on 2 rounds of antibiotics and still has symptoms/signs of infection.     .   Pain is rated at   6 /10 and is described as sharp/dull.  Pain is intermittent mostly with motion.  Pain is made better by rest/immobilization, worse with motion.   Associated signs/symptoms: denies Previous treatment:  antibiotics x 2 Augmentin  Past Medical History:  Diagnosis Date   Anemia    as child   ANXIETY    Cataract    bilateral-removed   Esophageal stricture    GERD (gastroesophageal reflux disease)    Osteoporosis    PLANTAR FASCIITIS, BILATERAL    PONV (postoperative nausea and vomiting)    SUPRAVENTRICULAR TACHYCARDIA    Urge incontinence     Past Surgical History:  Procedure Laterality Date   Adenosine Myoview  03/14/05   Cather ablation  04/2007   EYE SURGERY Bilateral 10-2010   cataracts with lens replacments   HERNIA REPAIR  15 yrs ago   NECK LIFT  05/2014   plastic  surgery for a neck lift   VAGINAL HYSTERECTOMY  yrs ago    Family History  Problem Relation Age of Onset   Arthritis Mother    Hypertension Mother    Coronary artery disease Father    Heart disease Father    Breast cancer Maternal Grandmother    Stroke Paternal Grandmother    Coronary artery disease Paternal Grandfather    Stomach cancer Neg Hx    Colon cancer Neg Hx    Colon polyps Neg Hx    Esophageal cancer Neg Hx    Rectal cancer Neg Hx     Social History:  reports that she has never smoked. She has never used smokeless tobacco. She reports current alcohol use of about 1.0 standard drink of alcohol per week. She reports that she does not use drugs.  Allergies:  Allergies  Allergen Reactions    Tramadol Nausea And Vomiting    Dizzy also    Medications: I have reviewed the patient's current medications.  Results for orders placed or performed during the hospital encounter of 02/25/23 (from the past 48 hour(s))  Basic metabolic panel     Status: None   Collection Time: 02/25/23  3:11 PM  Result Value Ref Range   Sodium 139 135 - 145 mmol/L   Potassium 4.4 3.5 - 5.1 mmol/L   Chloride 103 98 - 111 mmol/L   CO2 29 22 - 32 mmol/L   Glucose, Bld 86 70 - 99 mg/dL    Comment: Glucose reference range applies only to samples taken after fasting for at least 8 hours.   BUN 14 8 - 23 mg/dL   Creatinine, Ser 0.65 0.44 - 1.00 mg/dL   Calcium 10.0 8.9 - 10.3 mg/dL   GFR, Estimated >60 >60 mL/min    Comment: (NOTE) Calculated using the CKD-EPI Creatinine Equation (2021)    Anion gap 7 5 - 15    Comment: Performed at [facility], [address] [place], [place] [address]  CBC with Differential     Status: None   Collection Time: 02/25/23  3:11 PM  Result Value Ref Range  WBC 5.8 4.0 - 10.5 K/uL   RBC 4.40 3.87 - 5.11 MIL/uL   Hemoglobin 13.6 12.0 - 15.0 g/dL   HCT 40.8 36.0 - 46.0 %   MCV 92.7 80.0 - 100.0 fL   MCH 30.9 26.0 - 34.0 pg   MCHC 33.3 30.0 - 36.0 g/dL   RDW 12.4 11.5 - 15.5 %   Platelets 240 150 - 400 K/uL   nRBC 0.0 0.0 - 0.2 %   Neutrophils Relative % 53 %   Neutro Abs 3.1 1.7 - 7.7 K/uL   Lymphocytes Relative 35 %   Lymphs Abs 2.0 0.7 - 4.0 K/uL   Monocytes Relative 8 %   Monocytes Absolute 0.5 0.1 - 1.0 K/uL   Eosinophils Relative 3 %   Eosinophils Absolute 0.2 0.0 - 0.5 K/uL   Basophils Relative 1 %   Basophils Absolute 0.0 0.0 - 0.1 K/uL   Immature Granulocytes 0 %   Abs Immature Granulocytes 0.02 0.00 - 0.07 K/uL    Comment: Performed at [facility], [address] [place], [place] [address]  Sedimentation rate     Status: Abnormal   Collection Time: 02/25/23  4:11 PM  Result Value Ref Range   Sed Rate 38  (H) 0 - 22 mm/hr    Comment: Performed at [facility], [place], [place] [address]  C-reactive protein     Status: None   Collection Time: 02/25/23  4:11 PM  Result Value Ref Range   CRP 0.6 <1.0 mg/dL    Comment: Performed at [facility], [address], [place], [place] [address]  Blood culture (routine x 2)     Status: None (Preliminary result)   Collection Time: 02/25/23  4:11 PM   Specimen: BLOOD LEFT ARM  Result Value Ref Range   Specimen Description      BLOOD LEFT ARM Performed at [facility] [address] [place], [place] [address]    Special Requests      Blood Culture adequate volume BOTTLES DRAWN AEROBIC AND ANAEROBIC Performed at [facility], [address], [place], [place] [address]    Culture      NO GROWTH < 24 HOURS Performed at [facility], [place] [address] [place], [place] [address]    Report Status PENDING   MRSA Next Gen by PCR, Nasal     Status: None   Collection Time: 02/26/23 12:38 AM   Specimen: Nasal Mucosa; Nasal Swab  Result Value Ref Range   MRSA by PCR Next Gen NOT DETECTED NOT DETECTED    Comment: (NOTE) The GeneXpert MRSA Assay (FDA approved for NASAL specimens only), is one component of a comprehensive MRSA colonization surveillance program. It is not intended to diagnose MRSA infection nor to guide or monitor treatment for MRSA infections. Test performance is not FDA approved in patients less than 33 years old. Performed at [facility], [place] [address], [place],  [address]     DG Finger Index Left  Result Date: 02/25/2023 CLINICAL DATA:  Cat bite.  Second digit EXAM: LEFT INDEX FINGER 3V COMPARISON:  None Available. FINDINGS: Osteopenia. Soft tissue swelling identified particularly along the proximal interphalangeal joint. No acute fracture or dislocation. No radiopaque foreign body. There is subtle cortical lucency along the base of the middle  phalanx of the second digit laterally. Small area of bony erosion is possible. IMPRESSION: Soft tissue swelling with subtle erosive appearance of the proximal  interphalangeal joint. This has a differential including focal infection/osteomyelitis with the history of cat bite. Electronically Signed   By: Jill Side M.D.   On: 02/25/2023 15:21    Pertinent items noted in HPI and remainder of comprehensive ROS otherwise negative. Temp:  [97.7 F (36.5 C)-98.6 F (37 C)] 98.6 F (37 C) (04/03 0600) Pulse Rate:  [55-64] 61 (04/03 0600) Resp:  [16-18] 16 (04/03 0600) BP: (108-165)/(51-95) 135/69 (04/03 0600) SpO2:  [93 %-98 %] 98 % (04/03 0600) Weight:  [72.6 kg] 72.6 kg (04/02 1226) General appearance: alert and cooperative Resp: clear to auscultation bilaterally Cardio: regular rate and rhythm GI: soft, non-tender; bowel sounds normal; no masses,  no organomegaly Extremities: LIF with redness, swelling around PIPJ, loss of flexion of finger, pain with attempted motion, sensation wnl, no open wound, but erythema centered around dorsal ulnar pip   Assessment: Probable pipj joint infection from cat bite Plan: Will plan OR joint waashout I have discussed this treatment plan in detail with patient, including the risks of the recommended treatment or surgery, the benefits and the alternatives.  The patient understands that additional treatment may be necessary.  Nicholous Girgenti C Kevron Patella 02/26/2023, 8:02 AM

## 2023-02-26 NOTE — H&P (Signed)
History and Physical    Patient: Sara [name] DOB: [birth year] DOA: 02/25/2023 DOS: the patient was seen and examined on 02/26/2023 PCP: Hoyt Koch, MD  Patient coming from: Home  Chief Complaint:  Chief Complaint  Patient presents with   Animal Bite   HPI: Sara [name] is a 72 y.o. female with medical history significant of GERD, SVT.  Pt in to ED with c/o ongoing swelling and tenderness of L index finger despite 2 rounds of augmentin (14 days total).  Infection secondary to a cat bite from her house cat that is fully vaccinated.  Redness and swelling present, and now she is having difficulty with mobility of the finger.    Review of Systems: As mentioned in the history of present illness. All other systems reviewed and are negative. Past Medical History:  Diagnosis Date   Anemia    as child   ANXIETY    Cataract    bilateral-removed   Esophageal stricture    GERD (gastroesophageal reflux disease)    Osteoporosis    PLANTAR FASCIITIS, BILATERAL    PONV (postoperative nausea and vomiting)    SUPRAVENTRICULAR TACHYCARDIA    Urge incontinence    Past Surgical History:  Procedure Laterality Date   Adenosine Myoview  03/14/05   Cather ablation  04/2007   EYE SURGERY Bilateral 10-2010   cataracts with lens replacments   HERNIA REPAIR  15 yrs ago   NECK LIFT  05/2014   plastic  surgery for a neck lift   VAGINAL HYSTERECTOMY  yrs ago   Social History:  reports that she has never smoked. She has never used smokeless tobacco. She reports current alcohol use of about 1.0 standard drink of alcohol per week. She reports that she does not use drugs.  Allergies  Allergen Reactions   Tramadol Nausea And Vomiting    Dizzy also    Family History  Problem Relation Age of Onset   Arthritis Mother    Hypertension Mother    Coronary artery disease Father    Heart disease Father    Breast cancer Maternal Grandmother    Stroke Paternal Grandmother     Coronary artery disease Paternal Grandfather    Stomach cancer Neg Hx    Colon cancer Neg Hx    Colon polyps Neg Hx    Esophageal cancer Neg Hx    Rectal cancer Neg Hx     Prior to Admission medications   Medication Sig Start Date End Date Taking? Authorizing Provider  alendronate (FOSAMAX) 70 MG tablet Take 70 mg by mouth once a week. 09/28/21  Yes [provider]  amoxicillin-clavulanate (AUGMENTIN) 875-125 MG tablet Take 1 tablet by mouth every 12 (twelve) hours. 02/14/23  Yes Ailene Ards, NP  estradiol (ESTRACE) 0.1 MG/GM vaginal cream Place 0.5 g vaginally 2 (two) times a week. 03/19/22  Yes [provider]  famotidine (PEPCID) 40 MG tablet Take 1 tablet (40 mg total) by mouth daily. 12/24/21  Yes Hoyt Koch, MD  Multiple Vitamin (MULTIVITAMIN ADULT PO) Take by mouth.   Yes [provider]  oxybutynin (DITROPAN-XL) 10 MG 24 hr tablet Take 1 tablet every day by oral route for 90 days. 09/24/22  Yes [provider]  Vibegron (GEMTESA) 75 MG TABS Take one tablet daily 07/25/21  Yes [provider]    Physical Exam: Vitals:   02/25/23 2200 02/25/23 2215 02/25/23 2233 02/25/23 2300  BP: 124/62 (!) 130/53 108/66 (!) 145/52  Pulse: (!) 56 (!) 55 (!) 55 (!) 59  Resp:    16  Temp:    97.7 F (36.5 C)  TempSrc:    Oral  SpO2: 95% 94% 93% 98%  Weight:      Height:       Constitutional: NAD, calm, comfortable Respiratory: clear to auscultation bilaterally, no wheezing, no crackles. Normal respiratory effort. No accessory muscle use.  Cardiovascular: Regular rate and rhythm, no murmurs / rubs / gallops. No extremity edema. 2+ pedal pulses. No carotid bruits.  Abdomen: no tenderness, no masses palpated. No hepatosplenomegaly. Bowel sounds positive.  Musculoskeletal: Difficulty with movement of finger Skin:     Neurologic: CN 2-12 grossly intact. Sensation intact, DTR normal. Strength 5/5 in all 4.  Psychiatric: Normal judgment and  insight. Alert and oriented x 3. Normal mood.   Data Reviewed:     X ray: IMPRESSION: Soft tissue swelling with subtle erosive appearance of the proximal interphalangeal joint. This has a differential including focal infection/osteomyelitis with the history of cat bite.  ESR 38  CRP 0.6  CBC and CMP are unremarkable (WNL)  Assessment and Plan: * Cellulitis of left index finger Cellulitis of L index finger in setting of cat bite.  Concern for possible deeper infection including osteomyelitis based on X rays. Failed outpt amino pcn EDP started pt on cefepime + vanc, will leave her on these for the moment EDP d/w Silvestre Gunner PA-C (on call for hand) I sent text message to Dr. Lenon Curt that patient had arrived. Day team may want to give Dr. Lenon Curt a call in AM to make sure he got the message. Will keep pt NPO      Advance Care Planning:   Code Status: Full Code  Consults: Hand surgery as above  Family Communication: No family in room  Severity of Illness: The appropriate patient status for this patient is OBSERVATION. Observation status is judged to be reasonable and necessary in order to provide the required intensity of service to ensure the patient's safety. The patient's presenting symptoms, physical exam findings, and initial radiographic and laboratory data in the context of their medical condition is felt to place them at decreased risk for further clinical deterioration. Furthermore, it is anticipated that the patient will be medically stable for discharge from the hospital within 2 midnights of admission.   Author: Etta Quill., DO 02/26/2023 2:22 AM  For on call review [email or web address].

## 2023-02-26 NOTE — Hospital Course (Signed)
Same day note Sara [name] is a 72 y.o. female with medical history significant of GERD, SVT presented hospital with swelling and tenderness of the left index finger despite 2 courses of Augmentin for 14 days.  Patient had sustained a cat bite from her house cat which was fully vaccinated but then continued to have redness swelling with difficulty mobility of the finger.  X-ray of the finger showed erosive appearance of the proximal interphalangeal joint ESR was elevated at 36 CRP 0.6.  CBC and CMP were unremarkable.  Patient seen and examined at bedside.  Patient was admitted to the hospital for left index finger swelling redness and difficulty mobility  At the time of my evaluation, patient complains of  Physical examination reveals  Laboratory data and imaging was reviewed  Assessment and Plan.  Cellulitis of left index finger Status post cat bite, failed outpatient treatment for 14 days with Augmentin.  Has been empirically started on cefepime and vancomycin.  Orthopedic Dr. Lenon Curt has been notified.  Follow orthopedic recommendation.  No Charge  Signed,  Delila Pereyra, MD [facility]

## 2023-02-26 NOTE — Assessment & Plan Note (Addendum)
Cellulitis of L index finger in setting of cat bite.  Concern for possible deeper infection including osteomyelitis based on X rays. Failed outpt amino pcn EDP started pt on cefepime + vanc, will leave her on these for the moment EDP d/w Silvestre Gunner PA-C (on call for hand) I sent text message to Dr. Lenon Curt that patient had arrived. Day team may want to give Dr. Lenon Curt a call in AM to make sure he got the message. Will keep pt NPO

## 2023-02-26 NOTE — Op Note (Unsigned)
NAME: Sara [name], Sara [name] MEDICAL RECORD NO: [id number] ACCOUNT NO: [id number] DATE OF BIRTH: October 29, 1951 FACILITY: [facility] LOCATION: [facility] PHYSICIAN: Nisha Dhami C. Lenon Curt, MD  Operative Report   DATE OF PROCEDURE: 02/26/2023  PREOPERATIVE DIAGNOSIS:  Cat bite infection of the left index finger.  POSTOPERATIVE DIAGNOSIS:  Cat bite infection of the left index finger.  PROCEDURE: 1.  Incision and drainage of cat bite wound to the left index finger. 2.  Arthrotomy of the PIP joint with washout and debridement of synovium of the PIP joint.  INDICATIONS:  The patient is a 72 year old female with history of cat bite to the left index finger.  She was on antibiotics and has not progressed and she still has pain and symptoms of PIP joint infection.  Risks, benefits and alternatives of surgery  were discussed with her and she agreed with this course of action.  Consent was obtained.  DESCRIPTION OF PROCEDURE:  The patient was taken back to the operating room and placed supine on the operating room table.  A timeout was performed.  Anesthesia was administered without difficulty.  The left upper extremity was prepped and draped in the  normal sterile fashion.  A lazy S incision was made over the area of most erythema ulnarly over the PIP joint.  Careful dissection was carried down to the extensor tendon apparatus.  There was some thickened synovial tissue overlying in the soft tissue.   This was gently debrided and sent for culture.  Next, the extensor tendon was opened sharply, exposing the PIP joint.  Cultures were taken of the synovial fluid and tissue within the PIP joint.  There was some thickened synovium, which was debrided.   There did appear to be at least one area ulnarly on the proximal phalanx head with a little erosion, whether this was from early arthritis or from the infection could not determine.  After debridement of some of the thickened synovium, irrigation of the  joint was then performed  thoroughly with about a liter of saline solution.  Next, bipolar was used to cauterize some of the synovium.  The extensor tendon was closed with 4-0 FiberWire and the skin was closed with 5-0 nylon.  A sterile dressing was  placed.  Marcaine was infiltrated at the base of the finger for postoperative pain control.  The patient was awakened and recovered without difficulty.  ESTIMATED BLOOD LOSS: Minimal.  SPECIMENS: Cultures of the synovium and joint space of the left index finger sent.   VAI D: 02/26/2023 4:42:53 pm T: 02/26/2023 9:39:00 pm  JOB: [id number]

## 2023-02-26 NOTE — Anesthesia Preprocedure Evaluation (Addendum)
Anesthesia Evaluation  Patient identified by MRN, date of birth, ID band Patient awake    Reviewed: Allergy & Precautions, NPO status , Patient's Chart, lab work & pertinent test results  History of Anesthesia Complications (+) PONV  Airway Mallampati: II  TM Distance: >3 FB Neck ROM: Full    Dental  (+) Dental Advisory Given, Teeth Intact   Pulmonary neg pulmonary ROS   breath sounds clear to auscultation       Cardiovascular + dysrhythmias (successful ablation) Supra Ventricular Tachycardia  Rhythm:Regular Rate:Normal     Neuro/Psych negative neurological ROS     GI/Hepatic Neg liver ROS,GERD  Medicated and Controlled,,  Endo/Other  negative endocrine ROS    Renal/GU negative Renal ROS     Musculoskeletal   Abdominal   Peds  Hematology negative hematology ROS (+)   Anesthesia Other Findings   Reproductive/Obstetrics                              Anesthesia Physical Anesthesia Plan  ASA: 2  Anesthesia Plan: General   Post-op Pain Management: Tylenol PO (pre-op)*   Induction: Intravenous  PONV Risk Score and Plan: 4 or greater and Ondansetron, Dexamethasone and Treatment may vary due to age or medical condition  Airway Management Planned: LMA  Additional Equipment: None  Intra-op Plan:   Post-operative Plan:   Informed Consent: I have reviewed the patients History and Physical, chart, labs and discussed the procedure including the risks, benefits and alternatives for the proposed anesthesia with the patient or authorized representative who has indicated his/her understanding and acceptance.     Dental advisory given  Plan Discussed with: CRNA and Surgeon  Anesthesia Plan Comments:          Anesthesia Quick Evaluation

## 2023-02-26 NOTE — Transfer of Care (Signed)
Immediate Anesthesia Transfer of Care Note  Patient: Sara [name]  Procedure(s) Performed: IRRIGATION AND DEBRIDEMENT LEFT INDEX FINGER (Left)  Patient Location: PACU  Anesthesia Type:General  Level of Consciousness: awake, alert , and oriented  Airway & Oxygen Therapy: Patient Spontanous Breathing and Patient connected to nasal cannula oxygen  Post-op Assessment: Report given to RN and Post -op Vital signs reviewed and stable  Post vital signs: Reviewed and stable  Last Vitals:  Vitals Value Taken Time  BP 149/71 02/26/23 1633  Temp    Pulse 61 02/26/23 1635  Resp 13 02/26/23 1635  SpO2 100 % 02/26/23 1635  Vitals shown include unvalidated device data.  Last Pain:  Vitals:   02/26/23 1448  TempSrc:   PainSc: 0-No pain         Complications: No notable events documented.

## 2023-02-26 NOTE — Progress Notes (Signed)
  Transition of Care [facility]) Screening Note   Patient Details  Name: MARIT LORENSON Date of Birth: [birth year]   Transition of Care [facility]) CM/SW Contact:    Cyndi Bender, RN Phone Number: 02/26/2023, 9:49 AM    Transition of Care Department [facility]) has reviewed patient and no TOC needs have been identified at this time. We will continue to monitor patient advancement through interdisciplinary progression rounds. If new patient transition needs arise, please place a TOC consult.

## 2023-02-26 NOTE — Anesthesia Postprocedure Evaluation (Signed)
Anesthesia Post Note  Patient: [name]  Procedure(s) Performed: IRRIGATION AND DEBRIDEMENT LEFT INDEX FINGER (Left)     Patient location during evaluation: PACU Anesthesia Type: General Level of consciousness: sedated, patient cooperative and oriented Pain management: pain level controlled Vital Signs Assessment: post-procedure vital signs reviewed and stable Respiratory status: spontaneous breathing, nonlabored ventilation and respiratory function stable Cardiovascular status: blood pressure returned to baseline and stable Postop Assessment: no apparent nausea or vomiting Anesthetic complications: no   No notable events documented.  Last Vitals:  Vitals:   02/26/23 1435 02/26/23 1633  BP: (!) 147/53 (!) 149/71  Pulse: 66 62  Resp: 18 18  Temp: 36.5 C 36.6 C  SpO2: 95% 100%    Last Pain:  Vitals:   02/26/23 1633  TempSrc:   PainSc: 0-No pain                 Colton Tassin,E. Voncille Simm

## 2023-02-26 NOTE — Progress Notes (Signed)
Same day note Sara [name] is a 72 y.o. female with medical history significant of GERD, SVT presented hospital with swelling and tenderness of the left index finger despite 2 courses of Augmentin for 14 days.  Patient had sustained a cat bite from her house cat which was fully vaccinated but then continued to have redness swelling with difficulty mobility of the finger.  X-ray of the finger showed erosive appearance of the proximal interphalangeal joint ESR was elevated at 38, CRP 0.6.  CBC and CMP were unremarkable.  Patient seen and examined at bedside.  Patient was admitted to the hospital for left index finger swelling redness and difficulty mobility  At the time of my evaluation, patient complains of mild finger pain.  Physical examination reveals left index finger redness and decreased mobility of the joint.  Laboratory data and imaging was reviewed  Assessment and Plan.  Cellulitis of left index finger Status post cat bite, failed outpatient treatment for 14 days with Augmentin.  Has been empirically started on cefepime and vancomycin.  Orthopedic Dr. Lenon Curt has been notified.  Follow orthopedic recommendation.  Currently n.p.o. for potential surgery today.  Communicated with the patient's husband at bedside.  No Charge  Signed,  Delila Pereyra, MD [facility]

## 2023-02-27 ENCOUNTER — Encounter (HOSPITAL_COMMUNITY): Payer: Self-pay | Admitting: General Surgery

## 2023-02-27 ENCOUNTER — Ambulatory Visit: Payer: PPO | Admitting: Internal Medicine

## 2023-02-27 DIAGNOSIS — W5501XA Bitten by cat, initial encounter: Secondary | ICD-10-CM | POA: Diagnosis not present

## 2023-02-27 DIAGNOSIS — L03012 Cellulitis of left finger: Secondary | ICD-10-CM | POA: Diagnosis not present

## 2023-02-27 MED ORDER — TRIPLE ANTIBIOTIC 3.5-400-5000 EX OINT: 1.0000 | TOPICAL_OINTMENT | Freq: Every day | CUTANEOUS | 0 refills | Status: DC

## 2023-02-27 MED ORDER — SULFAMETHOXAZOLE-TRIMETHOPRIM 800-160 MG PO TABS: 1.0000 | ORAL_TABLET | Freq: Two times a day (BID) | ORAL | 0 refills | Status: AC

## 2023-02-27 MED ORDER — SODIUM CHLORIDE 0.9 % IV SOLN
2.0000 g | INTRAVENOUS | Status: DC
  Administered 2023-02-27: 2 g via INTRAVENOUS
  Filled 2023-02-27: qty 20

## 2023-02-27 NOTE — Discharge Summary (Signed)
Physician Discharge Summary  Sara [name] ZOX:[id number] DOB: Jan 23, 1951 DOA: 02/25/2023  PCP: Myrlene Broker, MD  Admit date: 02/25/2023 Discharge date: 02/27/2023  Admitted From: Home  Discharge disposition: home    Recommendations for Outpatient Follow-Up:   Follow up with your primary care provider in one week.  Check CBC, BMP, magnesium in the next visit Follow-up with Dr. Izora Ribas orthopedics as outpatient in 2 weeks.   Discharge Diagnosis:   Principal Problem:   Cellulitis of left index finger Active Problems:   Cat bite   Discharge Condition: Improved.  Diet recommendation:  Regular.  Wound care: None.  Code status: Full.   History of Present Illness:   Sara [name] is a 72 y.o. female with medical history significant of GERD, SVT presented hospital with swelling and tenderness of the left index finger despite 2 courses of Augmentin for 14 days.  Patient had sustained a cat bite from her house cat which was fully vaccinated but then continued to have redness swelling with difficulty mobility of the finger.  X-ray of the finger showed erosive appearance of the proximal interphalangeal joint ESR was elevated at 36 CRP 0.6.  CBC and CMP were unremarkable.  Patient was then admitted hospital for further evaluation and treatment.   Hospital Course:   Following conditions were addressed during hospitalization as listed below,  Cellulitis of left index finger Status post cat bite, failed outpatient treatment for 14 days with Augmentin.  Patient was empirically started on cefepime and vancomycin during hospitalization..  Orthopedic Dr. Izora Ribas has seen the patient and patient has undergone incision and drainage of the cat bite wound to the left index finger with arthrotomy of the PIP joint with washout and debridement of the synovitis.  Tissue culture has been sent which have been negative so far.  She will be prescribed Bactrim DS on discharge for next 7 Days with  outpatient hand surgery follow-up.  Disposition.  At this time, patient is stable for disposition with outpatient orthopedic follow-up.  Medical Consultants:   Dr. Izora Ribas surgery.   Procedures:    1.  Incision and drainage of cat bite wound to the left index finger. 2.  Arthrotomy of the PIP joint with washout and debridement of synovium of the PIP joint. Subjective:   Today, patient was seen and examined at bedside. Denies interval complaints. Denies any nausea, vomiting, fever chills or rigor. Denies overt pain.   Discharge Exam:   Vitals:   02/27/23 1346 02/27/23 1600  BP: (!) 116/50 116/65  Pulse: 63 66  Resp: 20 18  Temp: 98.7 F (37.1 C) 98 F (36.7 C)  SpO2: 93% 94%   Vitals:   02/26/23 2009 02/27/23 0753 02/27/23 1346 02/27/23 1600  BP: 128/68 (!) 145/67 (!) 116/50 116/65  Pulse: 73 65 63 66  Resp: 16 20 20 18   Temp: 98.9 F (37.2 C) 98.2 F (36.8 C) 98.7 F (37.1 C) 98 F (36.7 C)  TempSrc: Oral Oral Oral Oral  SpO2: 95% 100% 93% 94%  Weight:      Height:       General: Alert awake, not in obvious distress HENT: pupils equally reacting to light,  No scleral pallor or icterus noted. Oral mucosa is moist.  Chest:  Clear breath sounds.  Diminished breath sounds bilaterally. No crackles or wheezes.  CVS: S1 &S2 heard. No murmur.  Regular rate and rhythm. Abdomen: Soft, nontender, nondistended.  Bowel sounds are heard.   Extremities: No cyanosis, clubbing or  edema.  Peripheral pulses are palpable. Left index finger with dressing. Psych: Alert, awake and oriented, normal mood CNS:  No cranial nerve deficits.  Power equal in all extremities.   Skin: Warm and dry.   The results of significant diagnostics from this hospitalization (including imaging, microbiology, ancillary and laboratory) are listed below for reference.     Diagnostic Studies:   DG Finger Index Left  Result Date: 02/25/2023 CLINICAL DATA:  Cat bite.  Second digit EXAM: LEFT INDEX FINGER 3V  COMPARISON:  None Available. FINDINGS: Osteopenia. Soft tissue swelling identified particularly along the proximal interphalangeal joint. No acute fracture or dislocation. No radiopaque foreign body. There is subtle cortical lucency along the base of the middle phalanx of the second digit laterally. Small area of bony erosion is possible. IMPRESSION: Soft tissue swelling with subtle erosive appearance of the proximal interphalangeal joint. This has a differential including focal infection/osteomyelitis with the history of cat bite. Electronically Signed   By: Karen KaysAshok  Gupta M.D.   On: 02/25/2023 15:21     Labs:   Basic Metabolic Panel: Recent Labs  Lab 02/25/23 1511 02/26/23 1135  NA 139 136  K 4.4 4.2  CL 103 104  CO2 29 23  GLUCOSE 86 95  BUN 14 10  CREATININE 0.65 0.69  CALCIUM 10.0 8.9   GFR Estimated Creatinine Clearance: 62.5 mL/min (by C-G formula based on SCr of 0.69 mg/dL). Liver Function Tests: No results for input(s): "AST", "ALT", "ALKPHOS", "BILITOT", "PROT", "ALBUMIN" in the last 168 hours. No results for input(s): "LIPASE", "AMYLASE" in the last 168 hours. No results for input(s): "AMMONIA" in the last 168 hours. Coagulation profile No results for input(s): "INR", "PROTIME" in the last 168 hours.  CBC: Recent Labs  Lab 02/25/23 1511 02/26/23 1135  WBC 5.8 5.9  NEUTROABS 3.1  --   HGB 13.6 13.3  HCT 40.8 38.8  MCV 92.7 91.5  PLT 240 229   Cardiac Enzymes: No results for input(s): "CKTOTAL", "CKMB", "CKMBINDEX", "TROPONINI" in the last 168 hours. BNP: Invalid input(s): "POCBNP" CBG: No results for input(s): "GLUCAP" in the last 168 hours. D-Dimer No results for input(s): "DDIMER" in the last 72 hours. Hgb A1c No results for input(s): "HGBA1C" in the last 72 hours. Lipid Profile No results for input(s): "CHOL", "HDL", "LDLCALC", "TRIG", "CHOLHDL", "LDLDIRECT" in the last 72 hours. Thyroid function studies No results for input(s): "TSH", "T4TOTAL",  "T3FREE", "THYROIDAB" in the last 72 hours.  Invalid input(s): "FREET3" Anemia work up No results for input(s): "VITAMINB12", "FOLATE", "FERRITIN", "TIBC", "IRON", "RETICCTPCT" in the last 72 hours. Microbiology Recent Results (from the past 240 hour(s))  Blood culture (routine x 2)     Status: None (Preliminary result)   Collection Time: 02/25/23  4:11 PM   Specimen: BLOOD LEFT ARM  Result Value Ref Range Status   Specimen Description   Final    BLOOD LEFT ARM Performed at [facility], [address] [place], [place] [address]    Special Requests   Final    Blood Culture adequate volume BOTTLES DRAWN AEROBIC AND ANAEROBIC Performed at [facility], [address], [place], [place] [address]    Culture   Final    NO GROWTH 2 DAYS Performed at [facility], [address] [place], [place] [address]    Report Status PENDING  Incomplete  MRSA Next Gen by PCR, Nasal     Status: None   Collection Time: 02/26/23 12:38 AM   Specimen:  Nasal Mucosa; Nasal Swab  Result Value Ref Range Status   MRSA by PCR Next Gen NOT DETECTED NOT DETECTED Final    Comment: (NOTE) The GeneXpert MRSA Assay (FDA approved for NASAL specimens only), is one component of a comprehensive MRSA colonization surveillance program. It is not intended to diagnose MRSA infection nor to guide or monitor treatment for MRSA infections. Test performance is not FDA approved in patients less than 42 years old. Performed at [facility], [address], [place], [place] [address]   Culture, blood (Routine X 2) w Reflex to ID Panel     Status: None (Preliminary result)   Collection Time: 02/26/23 11:35 AM   Specimen: BLOOD LEFT HAND  Result Value Ref Range Status   Specimen Description BLOOD LEFT HAND  Final   Special Requests   Final    BOTTLES DRAWN AEROBIC AND ANAEROBIC Blood Culture adequate volume   Culture   Final    NO GROWTH < 24 HOURS Performed at [facility], [address] [place], [place] [address]    Report Status PENDING  Incomplete  Aerobic Culture w Gram Stain (superficial specimen)     Status: None (Preliminary result)   Collection Time: 02/26/23  4:00 PM   Specimen: Wound  Result Value Ref Range Status   Specimen Description WOUND  Final   Special Requests LEFT INDEX FINNGER  Final   Gram Stain NO WBC SEEN NO ORGANISMS SEEN   Final   Culture   Final    NO GROWTH < 24 HOURS Performed at [facility], [address] [place], [place] [address]    Report Status PENDING  Incomplete     Discharge Instructions:   Discharge Instructions     Call MD for:  redness, tenderness, or signs of infection (pain, swelling, redness, odor or green/yellow discharge around incision site)   Complete by: As directed    Call MD for:  severe uncontrolled pain   Complete by: As directed    Call MD for:  temperature >100.4   Complete by: As directed    Diet - low sodium heart healthy   Complete by: As directed    Discharge instructions   Complete by: As directed    Follow up with Dr Izora Ribas in 2 weeks.   wash with soap and water, keep clean, no soaking, apply small amount antibiotic ointment.   Increase activity slowly   Complete by: As directed       Allergies as of 02/27/2023       Reactions   Tramadol Nausea And Vomiting   Dizzy also        Medication List     STOP taking these medications    amoxicillin-clavulanate 875-125 MG tablet Commonly known as: AUGMENTIN       TAKE these medications    cholecalciferol 25 MCG (1000 UNIT) tablet Commonly known as: VITAMIN D3 Take 1,000 Units by mouth daily.   estradiol 0.1 MG/GM vaginal cream Commonly known as: ESTRACE Place 0.5 g vaginally 2 (two) times a week.   famotidine 40 MG tablet Commonly known as: Pepcid Take 1 tablet (40 mg total) by mouth daily.   Gemtesa 75 MG Tabs Generic drug: Vibegron Take 1 tablet by mouth daily. Take one tablet daily   MULTIVITAMIN  ADULT PO Take 1 tablet by mouth daily.   neomycin-bacitracin-polymyxin [phone] Oint Apply 1 Application topically daily.   oxybutynin 10 MG 24 hr tablet Commonly known  as: DITROPAN-XL Take 10 mg by mouth daily.   sulfamethoxazole-trimethoprim 800-160 MG tablet Commonly known as: BACTRIM DS Take 1 tablet by mouth 2 (two) times daily for 7 days.        Follow-up Information     Knute Neuoley, [place], MD. Schedule an appointment as soon as possible for a visit in 2 week(s).   Specialty: General Surgery Contact information: [address]  [address] [place] [address] [phone]         Myrlene Brokerrawford, Elizabeth A, MD Follow up in 1 week(s).   Specialty: Internal Medicine Contact information: [address] [place] [address] [phone]                 Time coordinating discharge: 39 minutes  Signed:  Lejend Dalby  [facility] 02/27/2023, 5:02 PM

## 2023-02-27 NOTE — Progress Notes (Signed)
Patient has been discharged home with her husband. PIV out and site looks clean,dry and intact. Education provided regarding her home medication and patient verbalize understanding. All her belongings and AVS paper were handed to the patient.

## 2023-02-27 NOTE — Progress Notes (Signed)
PROGRESS NOTE    Sara [name]  [id number] DOB: November 01, 1951 DOA: 02/25/2023 PCP: Hoyt Koch, MD    Brief Narrative:   Sara [name] is a 72 y.o. female with medical history significant of GERD, SVT presented hospital with swelling and tenderness of the left index finger despite 2 courses of Augmentin for 14 days.  Patient had sustained a cat bite from her house cat which was fully vaccinated but then continued to have redness swelling with difficulty mobility of the finger.  X-ray of the finger showed erosive appearance of the proximal interphalangeal joint ESR was elevated at 36 CRP 0.6.  CBC and CMP were unremarkable.  Patient was then admitted hospital for further evaluation and treatment.  Assessment and Plan.  Cellulitis of left index finger Status post cat bite, failed outpatient treatment for 14 days with Augmentin.  Has been empirically started on cefepime and vancomycin.  Orthopedic Dr. Lenon Curt has seen the patient and patient has undergone incision and drainage of the cat bite wound to the left index finger with arthrotomy of the PIP joint with washout and debridement of the synovitis.  Tissue culture has been sent pending at this time.  Will follow orthopedic recommendation.   DVT prophylaxis: SCDs Start: 02/26/23 0202   Code Status:     Code Status: Full Code  Disposition: Home likely in 1 to 2 days. Status is: Inpatient Remains inpatient appropriate because: Status post surgery, await cultures.   Family Communication: None at bedside.  Consultants:  Dr. Lenon Curt surgery.  Procedures:  1.  Incision and drainage of cat bite wound to the left index finger. 2.  Arthrotomy of the PIP joint with washout and debridement of synovium of the PIP joint.  Antimicrobials:  Vancomycin and cefepime  Anti-infectives (From admission, onward)    Start     Dose/Rate Route Frequency Ordered Stop   02/26/23 1800  vancomycin (VANCOREADY) IVPB 1250 mg/250 mL        1,250  mg 166.7 mL/hr over 90 Minutes Intravenous Every 24 hours 02/25/23 1552     02/26/23 0030  ceFEPIme (MAXIPIME) 2 g in sodium chloride 0.9 % 100 mL IVPB        2 g 200 mL/hr over 30 Minutes Intravenous Every 8 hours 02/25/23 1552     02/25/23 1700  vancomycin (VANCOCIN) 500 mg in sodium chloride 0.9 % 100 mL IVPB       See Hyperspace for full Linked Orders Report.   500 mg 100 mL/hr over 60 Minutes Intravenous  Once 02/25/23 1535 02/25/23 1927   02/25/23 1545  ceFEPIme (MAXIPIME) 2 g in sodium chloride 0.9 % 100 mL IVPB        2 g 200 mL/hr over 30 Minutes Intravenous  Once 02/25/23 1535 02/25/23 1652   02/25/23 1545  vancomycin (VANCOCIN) IVPB 1000 mg/200 mL premix       See Hyperspace for full Linked Orders Report.   1,000 mg 200 mL/hr over 60 Minutes Intravenous  Once 02/25/23 1535 02/25/23 1753      Subjective: Today, patient was seen and examined at bedside.  Denies interval complaints.  Denies any nausea vomiting fever chills or rigor.  Denies overt pain.  Objective: Vitals:   02/26/23 1700 02/26/23 1740 02/26/23 2009 02/27/23 0753  BP: (!) 159/59 (!) 152/63 128/68 (!) 145/67  Pulse: 60 (!) 57 73 65  Resp: 16 16 16 20   Temp: 97.8 F (36.6 C) 97.9 F (36.6 C) 98.9 F (37.2 C) 98.2  F (36.8 C)  TempSrc:  Oral Oral Oral  SpO2: 96% 95% 95% 100%  Weight:      Height:        Intake/Output Summary (Last 24 hours) at 02/27/2023 0936 Last data filed at 02/26/2023 1621 Gross per 24 hour  Intake 300 ml  Output 2 ml  Net 298 ml   Filed Weights   02/25/23 1226 02/26/23 1435  Weight: 72.6 kg 72.6 kg    Physical Examination: Body mass index is 26.63 kg/m.  General:  Average built, not in obvious distress HENT:   No scleral pallor or icterus noted. Oral mucosa is moist.  Chest:  Clear breath sounds.  Diminished breath sounds bilaterally. No crackles or wheezes.  CVS: S1 &S2 heard. No murmur.  Regular rate and rhythm. Abdomen: Soft, nontender, nondistended.  Bowel sounds are  heard.   Extremities: No cyanosis, clubbing or edema.  Left index finger with dressing. Psych: Alert, awake and oriented, normal mood CNS:  No cranial nerve deficits.  Power equal in all extremities.   Skin: Warm and dry.  No rashes noted.  Data Reviewed:   CBC: Recent Labs  Lab 02/25/23 1511 02/26/23 1135  WBC 5.8 5.9  NEUTROABS 3.1  --   HGB 13.6 13.3  HCT 40.8 38.8  MCV 92.7 91.5  PLT 240 [id number]    Basic Metabolic Panel: Recent Labs  Lab 02/25/23 1511 02/26/23 1135  NA 139 136  K 4.4 4.2  CL 103 104  CO2 29 23  GLUCOSE 86 95  BUN 14 10  CREATININE 0.65 0.69  CALCIUM 10.0 8.9    Liver Function Tests: No results for input(s): "AST", "ALT", "ALKPHOS", "BILITOT", "PROT", "ALBUMIN" in the last 168 hours.   Radiology Studies: DG Finger Index Left  Result Date: 02/25/2023 CLINICAL DATA:  Cat bite.  Second digit EXAM: LEFT INDEX FINGER 3V COMPARISON:  None Available. FINDINGS: Osteopenia. Soft tissue swelling identified particularly along the proximal interphalangeal joint. No acute fracture or dislocation. No radiopaque foreign body. There is subtle cortical lucency along the base of the middle phalanx of the second digit laterally. Small area of bony erosion is possible. IMPRESSION: Soft tissue swelling with subtle erosive appearance of the proximal interphalangeal joint. This has a differential including focal infection/osteomyelitis with the history of cat bite. Electronically Signed   By: Jill Side M.D.   On: 02/25/2023 15:21      LOS: 1 day    Flora Lipps, MD [facility] Available via Epic secure chat 7am-7pm After these hours, please refer to coverage provider listed on [email or web address] 02/27/2023, 9:36 AM

## 2023-02-27 NOTE — Progress Notes (Signed)
S:pt doing well, minimal pain in finger, some swelling  O:Blood pressure (!) 116/50, pulse 63, temperature 98.7 F (37.1 C), temperature source Oral, resp. rate 20, height 5\' 5"  (1.651 m), weight 72.6 kg, SpO2 93 %.  Dressing removed, min swelling and erythema, incision intact  A:POD #1 I&D pipj LIF   P: f/u cultures, home on oral abx; instructed pt on gentle rom of finger, wash with soap and water, keep clean, no soaking, apply small amount abx ointment.  F/u in office 2 wks

## 2023-02-27 NOTE — Progress Notes (Signed)
Pt with no drug resistance hx. Ok to optimize cefepime to ceftriaxone per Dr. Louanne Belton.  Onnie Boer, PharmD, BCIDP, AAHIVP, CPP Infectious Disease Pharmacist 02/27/2023 12:23 PM

## 2023-02-27 NOTE — Plan of Care (Signed)

## 2023-02-28 ENCOUNTER — Telehealth: Payer: Self-pay | Admitting: *Deleted

## 2023-02-28 ENCOUNTER — Encounter: Payer: Self-pay | Admitting: *Deleted

## 2023-02-28 NOTE — Transitions of Care (Post Inpatient/ED Visit) (Signed)
   02/28/2023  Name: Sara [name] MRN: [id number] DOB: 1951-10-03  Today's TOC FU Call Status: Today's TOC FU Call Status:: Unsuccessul Call (1st Attempt) Unsuccessful Call (1st Attempt) Date: 02/28/23  Attempted to reach the patient regarding the most recent Inpatient visit; after 2 attempts, left HIPAA compliant voice message requesting call back  Follow Up Plan: Additional outreach attempts will be made to reach the patient to complete the Transitions of Care (Post Inpatient visit) call.   Caryl Pina, RN, BSN, CCRN Alumnus RN CM Care Coordination/ Transition of Care- [facility] Care Management [phone]: direct office

## 2023-03-02 LAB — CULTURE, BLOOD (ROUTINE X 2)
Culture: NO GROWTH
Special Requests: ADEQUATE

## 2023-03-02 LAB — AEROBIC CULTURE W GRAM STAIN (SUPERFICIAL SPECIMEN)
Culture: NO GROWTH
Gram Stain: NONE SEEN

## 2023-03-03 ENCOUNTER — Telehealth: Payer: Self-pay | Admitting: *Deleted

## 2023-03-03 ENCOUNTER — Encounter: Payer: Self-pay | Admitting: *Deleted

## 2023-03-03 LAB — CULTURE, BLOOD (ROUTINE X 2)
Culture: NO GROWTH
Special Requests: ADEQUATE

## 2023-03-03 NOTE — Transitions of Care (Post Inpatient/ED Visit) (Signed)
03/03/2023  Name: Sara [name] MRN: [id number] DOB: 07/20/1951  Today's TOC FU Call Status: Today's TOC FU Call Status:: Successful TOC FU Call Competed TOC FU Call Complete Date: 03/03/23  Transition Care Management Follow-up Telephone Call Date of Discharge: 02/27/23 Discharge Facility: [facility]) Type of Discharge: Inpatient Admission Primary Inpatient Discharge Diagnosis:: Surgical I/D of (L) index finger secondary to cellulitis from infection from cat bite How have you been since you were released from the hospital?: Better ("I am doing fine; the swelling has gone down and there is very little discomfort.  I am taking my antibiotic and so far, no bad side effects.  thanks for getting the appointment scheduled with Dr. Okey Dupre") Any questions or concerns?: No  Items Reviewed: Did you receive and understand the discharge instructions provided?: Yes (thoroughly reviewed with patient who verbalizes excellent understanding of same) Medications obtained and verified?: Yes (Medications Reviewed) (Full medication review completed; no concerns or discrepancies identified; confirmed patient obtained/ is taking all newly Rx'd medications as instructed; self-manages medications and denies questions/ concerns around medications today) Any new allergies since your discharge?: No Dietary orders reviewed?: Yes Type of Diet Ordered:: "Regular" Do you have support at home?: Yes People in Home: spouse Name of Support/Comfort Primary Source: reports she is independent in self-care activities; husband assists as needed/ indicated  Home Care and Equipment/Supplies: Were Home Health Services Ordered?: No Any new equipment or medical supplies ordered?: No  Functional Questionnaire: Do you need assistance with bathing/showering or dressing?: No Do you need assistance with meal preparation?: No Do you need assistance with eating?: No Do you have difficulty maintaining continence: No Do you need  assistance with getting out of bed/getting out of a chair/moving?: No Do you have difficulty managing or taking your medications?: No  Follow up appointments reviewed: PCP Follow-up appointment confirmed?: Yes (care coordination outreach in real-time with scheduling care guide to successfully schedule hospital follow up PCP appointment 03/04/23) Date of PCP follow-up appointment?: 03/04/23 Follow-up Provider: PCP; Dr. Okey Dupre Specialist [facility] Follow-up appointment confirmed?: Yes Date of Specialist follow-up appointment?: 03/14/23 Follow-Up Specialty Provider:: Surgical provider- Dr. Izora Ribas Do you need transportation to your follow-up appointment?: No Do you understand care options if your condition(s) worsen?: Yes-patient verbalized understanding  SDOH Interventions Today    Flowsheet Row Most Recent Value  SDOH Interventions   Food Insecurity Interventions Intervention Not Indicated  Transportation Interventions Intervention Not Indicated  [normally drives self,  husband assists as/ if needed]      TOC Interventions Today    Flowsheet Row Most Recent Value  TOC Interventions   TOC Interventions Discussed/Reviewed TOC Interventions Discussed, Arranged PCP follow up within 7 days/Care Guide scheduled, S/S of infection  [Patient declines need for ongoing/ further care coordination outreach,  no care coordination needs identified at time of TOC call today,  provided my direct contact information should questions/ concerns/ needs arise post-TOC call]      Interventions Today    Flowsheet Row Most Recent Value  Chronic Disease   Chronic disease during today's visit Other  [cellulitis from finger infection due to cat bite,  surgical I/D]  General Interventions   General Interventions Discussed/Reviewed General Interventions Discussed, Doctor Visits  Doctor Visits Discussed/Reviewed Doctor Visits Discussed, PCP, Specialist  PCP/Specialist Visits Compliance with follow-up visit   Education Interventions   Education Provided Provided Education  Provided Verbal Education On Medication  [expected side effects of antibiotics along with management of same- patient reports no side effects and  states has been eating "a lot of yogurt"]  Nutrition Interventions   Nutrition Discussed/Reviewed Nutrition Discussed  Pharmacy Interventions   Pharmacy Dicussed/Reviewed Pharmacy Topics Discussed  [Full medication review with updating medication list in EHR per patient report]      \[name], RN, BSN, CCRN Alumnus RN CM Care Coordination/ Transition of Care- [facility] Care Management [phone]: direct office

## 2023-03-04 ENCOUNTER — Ambulatory Visit (INDEPENDENT_AMBULATORY_CARE_PROVIDER_SITE_OTHER): Payer: PPO | Admitting: Internal Medicine

## 2023-03-04 ENCOUNTER — Encounter: Payer: Self-pay | Admitting: Internal Medicine

## 2023-03-04 VITALS — BP 140/80 | HR 52 | Temp 97.9°F | Ht 65.0 in | Wt 161.1 lb

## 2023-03-04 DIAGNOSIS — L03012 Cellulitis of left finger: Secondary | ICD-10-CM | POA: Diagnosis not present

## 2023-03-04 LAB — COMPREHENSIVE METABOLIC PANEL
ALT: 16 U/L (ref 0–35)
AST: 19 U/L (ref 0–37)
Albumin: 4.2 g/dL (ref 3.5–5.2)
Alkaline Phosphatase: 59 U/L (ref 39–117)
BUN: 16 mg/dL (ref 6–23)
CO2: 27 mEq/L (ref 19–32)
Calcium: 9.8 mg/dL (ref 8.4–10.5)
Chloride: 100 mEq/L (ref 96–112)
Creatinine, Ser: 0.95 mg/dL (ref 0.40–1.20)
GFR: 59.54 mL/min — ABNORMAL LOW (ref >60.00)
Glucose, Bld: 84 mg/dL (ref 70–99)
Potassium: 5.1 mEq/L (ref 3.5–5.1)
Sodium: 135 mEq/L (ref 135–145)
Total Bilirubin: 0.4 mg/dL (ref 0.2–1.2)
Total Protein: 6.9 g/dL (ref 6.0–8.3)

## 2023-03-04 LAB — CBC
HCT: 39.9 % (ref 36.0–46.0)
Hemoglobin: 13.2 g/dL (ref 12.0–15.0)
MCHC: 33.1 g/dL (ref 30.0–36.0)
MCV: 93.2 fl (ref 78.0–100.0)
Platelets: 266 10*3/uL (ref 150.0–400.0)
RBC: 4.28 Mil/uL (ref 3.87–5.11)
RDW: 13.5 % (ref 11.5–15.5)
WBC: 6.9 10*3/uL (ref 4.0–10.5)

## 2023-03-04 LAB — SEDIMENTATION RATE: Sed Rate: 14 mm/hr (ref 0–30)

## 2023-03-04 LAB — MAGNESIUM: Magnesium: 2 mg/dL (ref 1.5–2.5)

## 2023-03-04 NOTE — Assessment & Plan Note (Signed)
Checking CBC, CMP, magnesium and ESR today. Finish last 2 days of bactrim and monitor closely for redness, swelling, worsening pain. If any signs call us and we can do additional antibiotics. She will continue follow up with surgeon and likely will need some PT to help mobilize joint.

## 2023-03-04 NOTE — Progress Notes (Signed)
   Subjective:   Patient ID: Sara [name], female    DOB: 1950/12/02, 72 y.o.   MRN: [id number]  HPI The patient is a 72 YO female coming in for hospital follow up (admitted for cellulitis and had I and D surgery left hand, given antibiotics and discharged with bactrim). She has 2 days left and overall pain is decreasing. ROM is still limited and swollen still. Seeing hand surgeon 1 week from this Friday.   PMH, [place], social history reviewed and updated  Review of Systems  Constitutional: Negative.   HENT: Negative.    Eyes: Negative.   Respiratory:  Negative for cough, chest tightness and shortness of breath.   Cardiovascular:  Negative for chest pain, palpitations and leg swelling.  Gastrointestinal:  Negative for abdominal distention, abdominal pain, constipation, diarrhea, nausea and vomiting.  Musculoskeletal: Negative.   Skin:  Positive for wound.  Neurological: Negative.   Psychiatric/Behavioral: Negative.      Objective:  Physical Exam Constitutional:      Appearance: She is well-developed.  HENT:     Head: Normocephalic and atraumatic.  Cardiovascular:     Rate and Rhythm: Normal rate and regular rhythm.  Pulmonary:     Effort: Pulmonary effort is normal. No respiratory distress.     Breath sounds: Normal breath sounds. No wheezing or rales.  Abdominal:     General: Bowel sounds are normal. There is no distension.     Palpations: Abdomen is soft.     Tenderness: There is no abdominal tenderness. There is no rebound.  Musculoskeletal:     Cervical back: Normal range of motion.  Skin:    General: Skin is warm and dry.     Comments: Left 2nd finger with stitches intact and no redness, some pain to palpation and limited range of motion at the PIP and DIP  Neurological:     Mental Status: She is alert and oriented to person, place, and time.     Coordination: Coordination normal.     Vitals:   03/04/23 1319 03/04/23 1322  BP: (!) 140/80 (!) 140/80  Pulse: (!) 52    Temp: 97.9 F (36.6 C)   TempSrc: Oral   SpO2: 96%   Weight: 161 lb 2 oz (73.1 kg)   Height: 5\' 5"  (1.651 m)     Assessment & Plan:

## 2023-03-04 NOTE — Patient Instructions (Signed)
Try taking pepcid (famotidine) daily and let us know if this is helping.   We will do the labs and finish the antibiotics. Let us know if the finger is worsening.

## 2023-03-17 ENCOUNTER — Other Ambulatory Visit: Payer: Self-pay | Admitting: Internal Medicine

## 2023-03-26 DIAGNOSIS — M25642 Stiffness of left hand, not elsewhere classified: Secondary | ICD-10-CM | POA: Diagnosis not present

## 2023-03-26 DIAGNOSIS — M6281 Muscle weakness (generalized): Secondary | ICD-10-CM | POA: Diagnosis not present

## 2023-03-26 DIAGNOSIS — M25542 Pain in joints of left hand: Secondary | ICD-10-CM | POA: Diagnosis not present

## 2023-03-26 DIAGNOSIS — M25442 Effusion, left hand: Secondary | ICD-10-CM | POA: Diagnosis not present

## 2023-03-28 DIAGNOSIS — M25442 Effusion, left hand: Secondary | ICD-10-CM | POA: Diagnosis not present

## 2023-03-28 DIAGNOSIS — M25642 Stiffness of left hand, not elsewhere classified: Secondary | ICD-10-CM | POA: Diagnosis not present

## 2023-03-28 DIAGNOSIS — M6281 Muscle weakness (generalized): Secondary | ICD-10-CM | POA: Diagnosis not present

## 2023-03-28 DIAGNOSIS — M25542 Pain in joints of left hand: Secondary | ICD-10-CM | POA: Diagnosis not present

## 2023-03-31 DIAGNOSIS — M25442 Effusion, left hand: Secondary | ICD-10-CM | POA: Diagnosis not present

## 2023-03-31 DIAGNOSIS — M6281 Muscle weakness (generalized): Secondary | ICD-10-CM | POA: Diagnosis not present

## 2023-03-31 DIAGNOSIS — M25642 Stiffness of left hand, not elsewhere classified: Secondary | ICD-10-CM | POA: Diagnosis not present

## 2023-03-31 DIAGNOSIS — M25542 Pain in joints of left hand: Secondary | ICD-10-CM | POA: Diagnosis not present

## 2023-04-08 DIAGNOSIS — M6281 Muscle weakness (generalized): Secondary | ICD-10-CM | POA: Diagnosis not present

## 2023-04-08 DIAGNOSIS — M25642 Stiffness of left hand, not elsewhere classified: Secondary | ICD-10-CM | POA: Diagnosis not present

## 2023-04-08 DIAGNOSIS — M25542 Pain in joints of left hand: Secondary | ICD-10-CM | POA: Diagnosis not present

## 2023-04-08 DIAGNOSIS — M25442 Effusion, left hand: Secondary | ICD-10-CM | POA: Diagnosis not present

## 2023-04-11 DIAGNOSIS — M25542 Pain in joints of left hand: Secondary | ICD-10-CM | POA: Diagnosis not present

## 2023-04-11 DIAGNOSIS — M25442 Effusion, left hand: Secondary | ICD-10-CM | POA: Diagnosis not present

## 2023-04-11 DIAGNOSIS — M25642 Stiffness of left hand, not elsewhere classified: Secondary | ICD-10-CM | POA: Diagnosis not present

## 2023-04-11 DIAGNOSIS — M6281 Muscle weakness (generalized): Secondary | ICD-10-CM | POA: Diagnosis not present

## 2023-04-14 DIAGNOSIS — M6281 Muscle weakness (generalized): Secondary | ICD-10-CM | POA: Diagnosis not present

## 2023-04-14 DIAGNOSIS — M25542 Pain in joints of left hand: Secondary | ICD-10-CM | POA: Diagnosis not present

## 2023-04-14 DIAGNOSIS — M25642 Stiffness of left hand, not elsewhere classified: Secondary | ICD-10-CM | POA: Diagnosis not present

## 2023-04-14 DIAGNOSIS — M25442 Effusion, left hand: Secondary | ICD-10-CM | POA: Diagnosis not present

## 2023-04-15 ENCOUNTER — Ambulatory Visit (INDEPENDENT_AMBULATORY_CARE_PROVIDER_SITE_OTHER): Payer: PPO

## 2023-04-15 VITALS — Ht 65.0 in | Wt 160.0 lb

## 2023-04-15 DIAGNOSIS — Z0000 Encounter for general adult medical examination without abnormal findings: Secondary | ICD-10-CM

## 2023-04-15 NOTE — Patient Instructions (Signed)
Sara [name] , Thank you for taking time to come for your Medicare Wellness Visit. I appreciate your ongoing commitment to your health goals. Please review the following plan we discussed and let me know if I can assist you in the future.   These are the goals we discussed:  Goals      My goal for 2024 is to get back into shape by walking and exercising more.        This is a list of the screening recommended for you and due dates:  Health Maintenance  Topic Date Due   Zoster (Shingles) Vaccine (1 of 2) Never done   Eye exam for diabetics  07/16/2022   COVID-19 Vaccine (6 - 2023-24 season) 07/26/2022   Flu Shot  06/26/2023   Mammogram  08/25/2023   Medicare Annual Wellness Visit  04/14/2024   Colon Cancer Screening  10/24/2024   DTaP/Tdap/Td vaccine (4 - Td or Tdap) 09/17/2031   Pneumonia Vaccine  Completed   DEXA scan (bone density measurement)  Completed   Hepatitis C Screening: USPSTF Recommendation to screen - Ages 26-79 yo.  Completed   HPV Vaccine  Aged Out    Advanced directives: Yes; Please bring a copy of your health care power of attorney and living will to the office at your convenience.  Conditions/risks identified: Yes  Next appointment: Follow up in one year for your annual wellness visit.   Preventive Care 43 Years and Older, Female Preventive care refers to lifestyle choices and visits with your health care provider that can promote health and wellness. What does preventive care include? A yearly physical exam. This is also called an annual well check. Dental exams once or twice a year. Routine eye exams. Ask your health care provider how often you should have your eyes checked. Personal lifestyle choices, including: Daily care of your teeth and gums. Regular physical activity. Eating a healthy diet. Avoiding tobacco and drug use. Limiting alcohol use. Practicing safe sex. Taking low-dose aspirin every day. Taking vitamin and mineral supplements as  recommended by your health care provider. What happens during an annual well check? The services and screenings done by your health care provider during your annual well check will depend on your age, overall health, lifestyle risk factors, and family history of disease. Counseling  Your health care provider may ask you questions about your: Alcohol use. Tobacco use. Drug use. Emotional well-being. Home and relationship well-being. Sexual activity. Eating habits. History of falls. Memory and ability to understand (cognition). Work and work Astronomer. Reproductive health. Screening  You may have the following tests or measurements: Height, weight, and BMI. Blood pressure. Lipid and cholesterol levels. These may be checked every 5 years, or more frequently if you are over 36 years old. Skin check. Lung cancer screening. You may have this screening every year starting at age 52 if you have a 30-pack-year history of smoking and currently smoke or have quit within the past 15 years. Fecal occult blood test (FOBT) of the stool. You may have this test every year starting at age 73. Flexible sigmoidoscopy or colonoscopy. You may have a sigmoidoscopy every 5 years or a colonoscopy every 10 years starting at age 20. Hepatitis C blood test. Hepatitis B blood test. Sexually transmitted disease (STD) testing. Diabetes screening. This is done by checking your blood sugar (glucose) after you have not eaten for a while (fasting). You may have this done every 1-3 years. Bone density scan. This is done to screen  for osteoporosis. You may have this done starting at age 75. Mammogram. This may be done every 1-2 years. Talk to your health care provider about how often you should have regular mammograms. Talk with your health care provider about your test results, treatment options, and if necessary, the need for more tests. Vaccines  Your health care provider may recommend certain vaccines, such  as: Influenza vaccine. This is recommended every year. Tetanus, diphtheria, and acellular pertussis (Tdap, Td) vaccine. You may need a Td booster every 10 years. Zoster vaccine. You may need this after age 103. Pneumococcal 13-valent conjugate (PCV13) vaccine. One dose is recommended after age 60. Pneumococcal polysaccharide (PPSV23) vaccine. One dose is recommended after age 25. Talk to your health care provider about which screenings and vaccines you need and how often you need them. This information is not intended to replace advice given to you by your health care provider. Make sure you discuss any questions you have with your health care provider. Document Released: 12/08/2015 Document Revised: 07/31/2016 Document Reviewed: 09/12/2015 Elsevier Interactive Patient Education  2017 [employer].  Fall Prevention in the Home Falls can cause injuries. They can happen to people of all ages. There are many things you can do to make your home safe and to help prevent falls. What can I do on the outside of my home? Regularly fix the edges of walkways and driveways and fix any cracks. Remove anything that might make you trip as you walk through a door, such as a raised step or threshold. Trim any bushes or trees on the path to your home. Use bright outdoor lighting. Clear any walking paths of anything that might make someone trip, such as rocks or tools. Regularly check to see if handrails are loose or broken. Make sure that both sides of any steps have handrails. Any raised decks and porches should have guardrails on the edges. Have any leaves, snow, or ice cleared regularly. Use sand or salt on walking paths during winter. Clean up any spills in your garage right away. This includes oil or grease spills. What can I do in the bathroom? Use night lights. Install grab bars by the toilet and in the tub and shower. Do not use towel bars as grab bars. Use non-skid mats or decals in the tub or  shower. If you need to sit down in the shower, use a plastic, non-slip stool. Keep the floor dry. Clean up any water that spills on the floor as soon as it happens. Remove soap buildup in the tub or shower regularly. Attach bath mats securely with double-sided non-slip rug tape. Do not have throw rugs and other things on the floor that can make you trip. What can I do in the bedroom? Use night lights. Make sure that you have a light by your bed that is easy to reach. Do not use any sheets or blankets that are too big for your bed. They should not hang down onto the floor. Have a firm chair that has side arms. You can use this for support while you get dressed. Do not have throw rugs and other things on the floor that can make you trip. What can I do in the kitchen? Clean up any spills right away. Avoid walking on wet floors. Keep items that you use a lot in easy-to-reach places. If you need to reach something above you, use a strong step stool that has a grab bar. Keep electrical cords out of the way. Do  not use floor polish or wax that makes floors slippery. If you must use wax, use non-skid floor wax. Do not have throw rugs and other things on the floor that can make you trip. What can I do with my stairs? Do not leave any items on the stairs. Make sure that there are handrails on both sides of the stairs and use them. Fix handrails that are broken or loose. Make sure that handrails are as long as the stairways. Check any carpeting to make sure that it is firmly attached to the stairs. Fix any carpet that is loose or worn. Avoid having throw rugs at the top or bottom of the stairs. If you do have throw rugs, attach them to the floor with carpet tape. Make sure that you have a light switch at the top of the stairs and the bottom of the stairs. If you do not have them, ask someone to add them for you. What else can I do to help prevent falls? Wear shoes that: Do not have high heels. Have  rubber bottoms. Are comfortable and fit you well. Are closed at the toe. Do not wear sandals. If you use a stepladder: Make sure that it is fully opened. Do not climb a closed stepladder. Make sure that both sides of the stepladder are locked into place. Ask someone to hold it for you, if possible. Clearly mark and make sure that you can see: Any grab bars or handrails. First and last steps. Where the edge of each step is. Use tools that help you move around (mobility aids) if they are needed. These include: Canes. Walkers. Scooters. Crutches. Turn on the lights when you go into a dark area. Replace any light bulbs as soon as they burn out. Set up your furniture so you have a clear path. Avoid moving your furniture around. If any of your floors are uneven, fix them. If there are any pets around you, be aware of where they are. Review your medicines with your doctor. Some medicines can make you feel dizzy. This can increase your chance of falling. Ask your doctor what other things that you can do to help prevent falls. This information is not intended to replace advice given to you by your health care provider. Make sure you discuss any questions you have with your health care provider. Document Released: 09/07/2009 Document Revised: 04/18/2016 Document Reviewed: 12/16/2014 Elsevier Interactive Patient Education  2017 [employer].

## 2023-04-15 NOTE — Progress Notes (Signed)
I connected with  Sara [name] on 04/15/23 by a audio enabled telemedicine application and verified that I am speaking with the correct person using two identifiers.  Patient Location: Home  Provider Location: Office/Clinic  I discussed the limitations of evaluation and management by telemedicine. The patient expressed understanding and agreed to proceed.  Patient Medicare AWV questionnaire was completed by the patient on 04/10/2023; I have confirmed that all information answered by patient is correct and no changes since this date.    Subjective:   Sara [name] is a 72 y.o. female who presents for Medicare Annual (Subsequent) preventive examination.  Review of Systems     Cardiac Risk Factors include: advanced age (>71men, >84 women);family history of premature cardiovascular disease;sedentary lifestyle     Objective:    Today's Vitals   04/15/23 1118 04/15/23 1127  Weight: 160 lb (72.6 kg)   Height: 5\' 5"  (1.651 m)   PainSc: 1  1   PainLoc: Finger    Body mass index is 26.63 kg/m.     04/15/2023   11:22 AM 02/25/2023   11:00 PM 02/25/2023   12:27 PM 10/25/2022    2:33 PM 03/25/2022    9:45 AM 09/10/2021    9:24 AM 03/15/2020    9:49 AM  Advanced Directives  Does Patient Have a Medical Advance Directive? Yes No No Yes Yes Yes Yes  Type of Estate agent of [place];Living will   Healthcare Power of [place];Living will;Out of facility DNR (pink MOST or yellow form) Healthcare Power of [place];Living will Healthcare Power of [place];Living will;Out of facility DNR (pink MOST or yellow form) Healthcare Power of [place];Living will  Does patient want to make changes to medical advance directive?       No - Patient declined  Copy of Healthcare Power of Attorney in Chart? No - copy requested    No - copy requested  No - copy requested  Would patient like information on creating a medical advance directive?  No - Patient declined No - Patient declined         Current Medications (verified) Outpatient Encounter Medications as of 04/15/2023  Medication Sig   cholecalciferol (VITAMIN D3) 25 MCG (1000 UNIT) tablet Take 1,000 Units by mouth daily.   estradiol (ESTRACE) 0.1 MG/GM vaginal cream Place 0.5 g vaginally 2 (two) times a week.   famotidine (PEPCID) 40 MG tablet TAKE 1 TABLET (40 MG TOTAL) BY MOUTH DAILY.   Multiple Vitamin (MULTIVITAMIN ADULT PO) Take 1 tablet by mouth daily.   oxybutynin (DITROPAN-XL) 10 MG 24 hr tablet Take 10 mg by mouth daily.   [DISCONTINUED] neomycin-bacitracin-polymyxin [phone] OINT Apply 1 Application topically daily.   No facility-administered encounter medications on file as of 04/15/2023.    Allergies (verified) Tramadol   History: Past Medical History:  Diagnosis Date   Anemia    as child   ANXIETY    Cataract    bilateral-removed   Esophageal stricture    GERD (gastroesophageal reflux disease)    Osteoporosis    PLANTAR FASCIITIS, BILATERAL    PONV (postoperative nausea and vomiting)    SUPRAVENTRICULAR TACHYCARDIA    Urge incontinence    Past Surgical History:  Procedure Laterality Date   Adenosine Myoview  03/14/05   Cather ablation  04/2007   EYE SURGERY Bilateral 10-2010   cataracts with lens replacments   HERNIA REPAIR  15 yrs ago   I & D EXTREMITY Left 02/26/2023   Procedure: IRRIGATION AND DEBRIDEMENT  LEFT INDEX FINGER;  Surgeon: Knute Neu, MD;  Location: [facility] OR;  Service: Plastics;  Laterality: Left;   NECK LIFT  05/2014   plastic  surgery for a neck lift   VAGINAL HYSTERECTOMY  yrs ago   Family History  Problem Relation Age of Onset   Arthritis Mother    Hypertension Mother    Coronary artery disease Father    Heart disease Father    Breast [name] Maternal Grandmother    Stroke Paternal Grandmother    Coronary artery disease Paternal Grandfather    Stomach [name] Neg Hx    Colon [name] Neg Hx    Colon polyps Neg Hx    Esophageal [name] Neg Hx    Rectal [name] Neg  Hx    Social History   Socioeconomic History   Marital status: Married    Spouse name: Not on file   Number of children: 0   Years of education: Not on file   Highest education level: Associate degree: academic program  Occupational History   Occupation: Printmaker: [employer]  Tobacco Use   Smoking status: Never   Smokeless tobacco: Never  Vaping Use   Vaping Use: Never used  Substance and Sexual Activity   Alcohol use: Yes    Alcohol/week: 1.0 standard drink of alcohol    Types: 1 Glasses of wine per week    Comment: occasionally   Drug use: No   Sexual activity: Not Currently    Partners: Male  Other Topics Concern   Not on file  Social History Narrative   1 step daughter, 2 grandchildren and 1 great grandchild   Social Determinants of Health   Financial Resource Strain: Low Risk  (04/15/2023)   Overall Financial Resource Strain (CARDIA)    Difficulty of Paying Living Expenses: Not hard at all  Food Insecurity: No Food Insecurity (04/15/2023)   Hunger Vital Sign    Worried About Running Out of Food in the Last Year: Never true    Ran Out of Food in the Last Year: Never true  Transportation Needs: No Transportation Needs (04/15/2023)   PRAPARE - Administrator, Civil Service (Medical): No    Lack of Transportation (Non-Medical): No  Physical Activity: Inactive (04/15/2023)   Exercise Vital Sign    Days of Exercise per Week: 0 days    Minutes of Exercise per Session: 0 min  Stress: No Stress Concern Present (04/15/2023)   [employer] of Occupational Health - Occupational Stress Questionnaire    Feeling of Stress : Only a little  Social Connections: Socially Integrated (04/15/2023)   Social Connection and Isolation Panel [NHANES]    Frequency of Communication with Friends and Family: More than three times a week    Frequency of Social Gatherings with Friends and Family: Once a week    Attends Religious Services: More than 4 times  per year    Active Member of [employer] or Organizations: Yes    Attends Engineer, structural: More than 4 times per year    Marital Status: Married    Tobacco Counseling Counseling given: Not Answered   Clinical Intake:  Pre-visit preparation completed: Yes  Pain : 0-10 Pain Score: 1  Pain Location: Finger (Comment which one)     BMI - recorded: 26.63 Nutritional Status: BMI 25 -29 Overweight Nutritional Risks: None Diabetes: No  How often do you need to have someone help you when you read instructions, pamphlets, or other  written materials from your doctor or pharmacy?: 1 - Never 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 No  Interpreter Needed?: No  Information entered by :: Orenthal Debski N. Cameron Schwinn, LPN.   Activities of Daily Living    04/15/2023   11:22 AM 04/10/2023    8:34 PM  In your present state of health, do you have any difficulty performing the following activities:  Hearing? 0 0  Vision? 0 0  Difficulty concentrating or making decisions? 0 0  Walking or climbing stairs? 0 0  Dressing or bathing? 0 0  Doing errands, shopping? 0 0  Preparing Food and eating ? N N  Using the Toilet? N N  In the past six months, have you accidently leaked urine? Y Y  Do you have problems with loss of bowel control? N N  Managing your Medications? N N  Managing your Finances? N N  Housekeeping or managing your Housekeeping? N N    Patient Care Team: Myrlene Broker, MD as PCP - General (Internal Medicine) Shea Evans, MD as Consulting Physician (Obstetrics and Gynecology) Glendale Chard, DO as Consulting Physician (Neurology)  Indicate any recent Medical Services you may have received from other than [facility] providers in the past year (date may be approximate).     Assessment:   This is a routine wellness examination for [place].  Hearing/Vision screen Hearing Screening - Comments:: Denies hearing difficulties    Vision Screening - Comments:: Wears rx glasses - up to date with routine eye exams with [facility]   Dietary issues and exercise activities discussed: Current Exercise Habits: The patient does not participate in regular exercise at present, Exercise limited by: None identified   Goals Addressed             This Visit's Progress    My goal for 2024 is to get back into shape by walking and exercising more.        Depression Screen    04/15/2023   11:23 AM 02/14/2023   11:31 AM 05/14/2022    8:58 AM 03/25/2022    9:44 AM 03/26/2021    1:39 PM 03/15/2020    9:49 AM 02/03/2019    1:22 PM  PHQ 2/9 Scores  PHQ - 2 Score 0 0 1 0 0 0 0  PHQ- 9 Score 0  4        Fall Risk    04/15/2023   11:24 AM 04/10/2023    8:34 PM 02/14/2023   11:31 AM 10/25/2022    2:33 PM 03/25/2022    9:45 AM  Fall Risk   Falls in the past year? 1 1 0 1 0  Number falls in past yr: 0 0 0 0 0  Injury with Fall? 0 0 0 0 0  Risk for fall due to : No Fall Risks  No Fall Risks  No Fall Risks  Follow up   Falls evaluation completed Falls evaluation completed Falls prevention discussed    FALL RISK PREVENTION PERTAINING TO THE HOME:  Any stairs in or around the home? Yes  If so, are there any without handrails? No  Home free of loose throw rugs in walkways, pet beds, electrical cords, etc? Yes  Adequate lighting in your home to reduce risk of falls? Yes   ASSISTIVE DEVICES UTILIZED TO PREVENT FALLS:  Life alert? Yes  Smartwatch Use of a cane, walker or w/c? No  Grab bars in the bathroom? Yes  Shower chair or bench in shower? Yes  Elevated toilet seat or a handicapped toilet? Yes   TIMED UP AND GO:  Was the test performed? No . Telephonic Visit  Cognitive Function:    12/30/2016    5:09 PM  MMSE - Mini Mental State Exam  Orientation to time 5  Orientation to Place 5  Registration 3  Attention/ Calculation 5  Recall 3  Language- name 2 objects 2  Language- repeat 1  Language- follow 3 step  command 3  Language- read & follow direction 1  Write a sentence 1  Copy design 1  Total score 30        04/15/2023   11:23 AM  6CIT Screen  What Year? 0 points  What month? 0 points  What time? 0 points  Count back from 20 0 points  Months in reverse 0 points  Repeat phrase 0 points  Total Score 0 points    Immunizations Immunization History  Administered Date(s) Administered   Fluad Quad(high Dose 65+) 08/30/2021, 09/06/2022   Influenza Split 11/13/2011   Influenza Whole 09/06/2009, 08/25/2012   Influenza, High Dose Seasonal PF 08/25/2017, 08/28/2018   Influenza,inj,Quad PF,6+ Mos 09/16/2013, 12/09/2014, 12/19/2015   Influenza-Unspecified 08/26/2017   PFIZER Comirnaty(Gray Top)Covid-19 Tri-Sucrose Vaccine 09/04/2020, 04/11/2021   PFIZER(Purple Top)SARS-COV-2 Vaccination 12/06/2019, 12/27/2019   Pfizer Covid-19 Vaccine Bivalent Booster 48yrs & up 09/19/2021   Pneumococcal Conjugate-13 03/21/2014   Pneumococcal Polysaccharide-23 12/10/1999, 12/19/2015   Td 11/25/1998, 09/06/2009   Tdap 09/16/2021   Zoster, Live 01/23/2010    TDAP status: Up to date  Flu Vaccine status: Up to date  Pneumococcal vaccine status: Up to date  Covid-19 vaccine status: Completed vaccines  Qualifies for Shingles Vaccine? Yes   Zostavax completed Yes   Shingrix Completed?: No.    Education has been provided regarding the importance of this vaccine. Patient has been advised to call insurance company to determine out of pocket expense if they have not yet received this vaccine. Advised may also receive vaccine at local pharmacy or Health Dept. Verbalized acceptance and understanding.  Screening Tests Health Maintenance  Topic Date Due   Zoster Vaccines- Shingrix (1 of 2) Never done   OPHTHALMOLOGY EXAM  07/16/2022   COVID-19 Vaccine (6 - 2023-24 season) 07/26/2022   INFLUENZA VACCINE  06/26/2023   MAMMOGRAM  08/25/2023   Medicare Annual Wellness (AWV)  04/14/2024   COLONOSCOPY (Pts  45-37yrs Insurance coverage will need to be confirmed)  10/24/2024   DTaP/Tdap/Td (4 - Td or Tdap) 09/17/2031   Pneumonia Vaccine 42+ Years old  Completed   DEXA SCAN  Completed   Hepatitis C Screening  Completed   HPV VACCINES  Aged Out    Health Maintenance  Health Maintenance Due  Topic Date Due   Zoster Vaccines- Shingrix (1 of 2) Never done   OPHTHALMOLOGY EXAM  07/16/2022   COVID-19 Vaccine (6 - 2023-24 season) 07/26/2022    Colorectal [name] screening: Type of screening: Colonoscopy. Completed 10/25/2019. Repeat every 5 years  Mammogram status: Completed 07/2022. Repeat every year  Bone Density status: Completed 09/14/2021. Results reflect: Bone density results: OSTEOPOROSIS. Repeat every 2 years.  Lung [name] Screening: (Low Dose CT Chest recommended if Age 15-80 years, 30 pack-year currently smoking OR have quit w/in 15years.) does not qualify.   Lung [name] Screening Referral: no  Additional Screening:  Hepatitis C Screening: does qualify; Completed 12/19/2015  Vision Screening: Recommended annual ophthalmology exams for early detection of glaucoma and other disorders of  the eye. Is the patient up to date with their annual eye exam?  Yes  Who is the provider or what is the name of the office in which the patient attends annual eye exams? [facility] If pt is not established with a provider, would they like to be referred to a provider to establish care? No .   Dental Screening: Recommended annual dental exams for proper oral hygiene  Community Resource Referral / Chronic Care Management: CRR required this visit?  No   CCM required this visit?  No      Plan:     I have personally reviewed and noted the following in the patient's chart:   Medical and social history Use of alcohol, tobacco or illicit drugs  Current medications and supplements including opioid prescriptions. Patient is not currently taking opioid prescriptions. Functional ability and  status Nutritional status Physical activity Advanced directives List of other physicians Hospitalizations, surgeries, and ER visits in previous 12 months Vitals Screenings to include cognitive, depression, and falls Referrals and appointments  In addition, I have reviewed and discussed with patient certain preventive protocols, quality metrics, and best practice recommendations. A written personalized care plan for preventive services as well as general preventive health recommendations were provided to patient.     Mickeal Needy, LPN   1/61/0960   Nurse Notes:  Normal cognitive status assessed by direct observation via telephone conversation by this Nurse Health Advisor. No abnormalities found.

## 2023-04-23 DIAGNOSIS — M25442 Effusion, left hand: Secondary | ICD-10-CM | POA: Diagnosis not present

## 2023-04-23 DIAGNOSIS — M25642 Stiffness of left hand, not elsewhere classified: Secondary | ICD-10-CM | POA: Diagnosis not present

## 2023-04-23 DIAGNOSIS — M6281 Muscle weakness (generalized): Secondary | ICD-10-CM | POA: Diagnosis not present

## 2023-04-23 DIAGNOSIS — M25542 Pain in joints of left hand: Secondary | ICD-10-CM | POA: Diagnosis not present

## 2023-04-25 DIAGNOSIS — M25642 Stiffness of left hand, not elsewhere classified: Secondary | ICD-10-CM | POA: Diagnosis not present

## 2023-04-25 DIAGNOSIS — M25442 Effusion, left hand: Secondary | ICD-10-CM | POA: Diagnosis not present

## 2023-04-25 DIAGNOSIS — M25542 Pain in joints of left hand: Secondary | ICD-10-CM | POA: Diagnosis not present

## 2023-04-25 DIAGNOSIS — M6281 Muscle weakness (generalized): Secondary | ICD-10-CM | POA: Diagnosis not present

## 2023-04-29 DIAGNOSIS — M6281 Muscle weakness (generalized): Secondary | ICD-10-CM | POA: Diagnosis not present

## 2023-04-29 DIAGNOSIS — M25442 Effusion, left hand: Secondary | ICD-10-CM | POA: Diagnosis not present

## 2023-04-29 DIAGNOSIS — M25642 Stiffness of left hand, not elsewhere classified: Secondary | ICD-10-CM | POA: Diagnosis not present

## 2023-04-29 DIAGNOSIS — M25542 Pain in joints of left hand: Secondary | ICD-10-CM | POA: Diagnosis not present

## 2023-05-01 DIAGNOSIS — M25642 Stiffness of left hand, not elsewhere classified: Secondary | ICD-10-CM | POA: Diagnosis not present

## 2023-05-01 DIAGNOSIS — M25442 Effusion, left hand: Secondary | ICD-10-CM | POA: Diagnosis not present

## 2023-05-01 DIAGNOSIS — M25542 Pain in joints of left hand: Secondary | ICD-10-CM | POA: Diagnosis not present

## 2023-05-01 DIAGNOSIS — M6281 Muscle weakness (generalized): Secondary | ICD-10-CM | POA: Diagnosis not present

## 2023-05-07 DIAGNOSIS — M25642 Stiffness of left hand, not elsewhere classified: Secondary | ICD-10-CM | POA: Diagnosis not present

## 2023-05-07 DIAGNOSIS — M6281 Muscle weakness (generalized): Secondary | ICD-10-CM | POA: Diagnosis not present

## 2023-05-07 DIAGNOSIS — M25442 Effusion, left hand: Secondary | ICD-10-CM | POA: Diagnosis not present

## 2023-05-07 DIAGNOSIS — M25542 Pain in joints of left hand: Secondary | ICD-10-CM | POA: Diagnosis not present

## 2023-05-09 DIAGNOSIS — M6281 Muscle weakness (generalized): Secondary | ICD-10-CM | POA: Diagnosis not present

## 2023-05-09 DIAGNOSIS — M25642 Stiffness of left hand, not elsewhere classified: Secondary | ICD-10-CM | POA: Diagnosis not present

## 2023-05-09 DIAGNOSIS — M25442 Effusion, left hand: Secondary | ICD-10-CM | POA: Diagnosis not present

## 2023-05-09 DIAGNOSIS — M25542 Pain in joints of left hand: Secondary | ICD-10-CM | POA: Diagnosis not present

## 2023-05-13 DIAGNOSIS — M6281 Muscle weakness (generalized): Secondary | ICD-10-CM | POA: Diagnosis not present

## 2023-05-13 DIAGNOSIS — M25542 Pain in joints of left hand: Secondary | ICD-10-CM | POA: Diagnosis not present

## 2023-05-13 DIAGNOSIS — M25442 Effusion, left hand: Secondary | ICD-10-CM | POA: Diagnosis not present

## 2023-05-13 DIAGNOSIS — M25642 Stiffness of left hand, not elsewhere classified: Secondary | ICD-10-CM | POA: Diagnosis not present

## 2023-05-15 DIAGNOSIS — M25542 Pain in joints of left hand: Secondary | ICD-10-CM | POA: Diagnosis not present

## 2023-05-15 DIAGNOSIS — M6281 Muscle weakness (generalized): Secondary | ICD-10-CM | POA: Diagnosis not present

## 2023-05-15 DIAGNOSIS — M25442 Effusion, left hand: Secondary | ICD-10-CM | POA: Diagnosis not present

## 2023-05-15 DIAGNOSIS — M25642 Stiffness of left hand, not elsewhere classified: Secondary | ICD-10-CM | POA: Diagnosis not present

## 2023-05-19 DIAGNOSIS — M25442 Effusion, left hand: Secondary | ICD-10-CM | POA: Diagnosis not present

## 2023-05-19 DIAGNOSIS — M25642 Stiffness of left hand, not elsewhere classified: Secondary | ICD-10-CM | POA: Diagnosis not present

## 2023-05-19 DIAGNOSIS — M25542 Pain in joints of left hand: Secondary | ICD-10-CM | POA: Diagnosis not present

## 2023-05-19 DIAGNOSIS — M6281 Muscle weakness (generalized): Secondary | ICD-10-CM | POA: Diagnosis not present

## 2023-05-20 ENCOUNTER — Ambulatory Visit (INDEPENDENT_AMBULATORY_CARE_PROVIDER_SITE_OTHER): Payer: PPO | Admitting: Internal Medicine

## 2023-05-20 ENCOUNTER — Encounter: Payer: Self-pay | Admitting: Internal Medicine

## 2023-05-20 VITALS — BP 140/80 | HR 87 | Temp 97.9°F | Ht 65.0 in | Wt 161.0 lb

## 2023-05-20 DIAGNOSIS — M810 Age-related osteoporosis without current pathological fracture: Secondary | ICD-10-CM

## 2023-05-20 DIAGNOSIS — K219 Gastro-esophageal reflux disease without esophagitis: Secondary | ICD-10-CM | POA: Diagnosis not present

## 2023-05-20 DIAGNOSIS — L03012 Cellulitis of left finger: Secondary | ICD-10-CM

## 2023-05-20 DIAGNOSIS — N3941 Urge incontinence: Secondary | ICD-10-CM

## 2023-05-20 DIAGNOSIS — Z0000 Encounter for general adult medical examination without abnormal findings: Secondary | ICD-10-CM

## 2023-05-20 MED ORDER — FAMOTIDINE 40 MG PO TABS: 40.0000 mg | ORAL_TABLET | Freq: Every day | ORAL | 3 refills | Status: DC

## 2023-05-20 MED ORDER — CEPHALEXIN 500 MG PO CAPS: 500.0000 mg | ORAL_CAPSULE | Freq: Two times a day (BID) | ORAL | 0 refills | Status: AC

## 2023-05-20 NOTE — Assessment & Plan Note (Signed)
Taking oxybutynin 10 mg xl daily and satisfied with control. Will continue.

## 2023-05-20 NOTE — Assessment & Plan Note (Signed)
Flu shot yearly. Pneumonia complete. Shingrix due at pharmacy. Tetanus up to date. Colonoscopy up to date. Mammogram up to date, pap smear up to date and dexa up to date. Counseled about sun safety and mole surveillance. Counseled about the dangers of distracted driving. Given 10 year screening recommendations.

## 2023-05-20 NOTE — Assessment & Plan Note (Signed)
Controlled with pepcid 40 mg daily and refilled. Will continue.

## 2023-05-20 NOTE — Assessment & Plan Note (Signed)
Off fosamax and will recheck DEXA 2025 or 2026

## 2023-05-20 NOTE — Patient Instructions (Addendum)
Think about getting the shingle vaccine  We have sent in keflex to take 1 pill twice a day for 1 week.

## 2023-05-20 NOTE — Assessment & Plan Note (Signed)
Looks more red today than prior. Rx keflex 1 week BID to treat this. Continue with her PT through ortho.

## 2023-05-20 NOTE — Progress Notes (Signed)
   Subjective:   Patient ID: Sara [name], female    DOB: 28-Mar-1951, 72 y.o.   MRN: [id number]  HPI The patient is here for physical.  PMH, [facility], social history reviewed and updated  Review of Systems  Constitutional: Negative.   HENT: Negative.    Eyes: Negative.   Respiratory:  Negative for cough, chest tightness and shortness of breath.   Cardiovascular:  Negative for chest pain, palpitations and leg swelling.  Gastrointestinal:  Negative for abdominal distention, abdominal pain, constipation, diarrhea, nausea and vomiting.  Musculoskeletal: Negative.   Skin: Negative.   Neurological: Negative.   Psychiatric/Behavioral: Negative.      Objective:  Physical Exam Constitutional:      Appearance: She is well-developed.  HENT:     Head: Normocephalic and atraumatic.  Cardiovascular:     Rate and Rhythm: Normal rate and regular rhythm.  Pulmonary:     Effort: Pulmonary effort is normal. No respiratory distress.     Breath sounds: Normal breath sounds. No wheezing or rales.  Abdominal:     General: Bowel sounds are normal. There is no distension.     Palpations: Abdomen is soft.     Tenderness: There is no abdominal tenderness. There is no rebound.  Musculoskeletal:     Cervical back: Normal range of motion.  Skin:    General: Skin is warm and dry.  Neurological:     Mental Status: She is alert and oriented to person, place, and time.     Coordination: Coordination normal.    Vitals:   05/20/23 0901 05/20/23 0903  BP: (!) 140/80 (!) 140/80  Pulse: 87   Temp: 97.9 F (36.6 C)   TempSrc: Oral   SpO2: 96%   Weight: 161 lb (73 kg)   Height: 5\' 5"  (1.651 m)     Assessment & Plan:

## 2023-05-26 DIAGNOSIS — M25642 Stiffness of left hand, not elsewhere classified: Secondary | ICD-10-CM | POA: Diagnosis not present

## 2023-05-26 DIAGNOSIS — M25442 Effusion, left hand: Secondary | ICD-10-CM | POA: Diagnosis not present

## 2023-05-26 DIAGNOSIS — M6281 Muscle weakness (generalized): Secondary | ICD-10-CM | POA: Diagnosis not present

## 2023-05-26 DIAGNOSIS — M25542 Pain in joints of left hand: Secondary | ICD-10-CM | POA: Diagnosis not present

## 2023-05-28 DIAGNOSIS — M25442 Effusion, left hand: Secondary | ICD-10-CM | POA: Diagnosis not present

## 2023-05-28 DIAGNOSIS — M25642 Stiffness of left hand, not elsewhere classified: Secondary | ICD-10-CM | POA: Diagnosis not present

## 2023-05-28 DIAGNOSIS — M25542 Pain in joints of left hand: Secondary | ICD-10-CM | POA: Diagnosis not present

## 2023-05-28 DIAGNOSIS — M6281 Muscle weakness (generalized): Secondary | ICD-10-CM | POA: Diagnosis not present

## 2023-06-03 DIAGNOSIS — M25542 Pain in joints of left hand: Secondary | ICD-10-CM | POA: Diagnosis not present

## 2023-06-03 DIAGNOSIS — M25642 Stiffness of left hand, not elsewhere classified: Secondary | ICD-10-CM | POA: Diagnosis not present

## 2023-06-03 DIAGNOSIS — M6281 Muscle weakness (generalized): Secondary | ICD-10-CM | POA: Diagnosis not present

## 2023-06-03 DIAGNOSIS — M25442 Effusion, left hand: Secondary | ICD-10-CM | POA: Diagnosis not present

## 2023-06-05 DIAGNOSIS — M6281 Muscle weakness (generalized): Secondary | ICD-10-CM | POA: Diagnosis not present

## 2023-06-05 DIAGNOSIS — M25542 Pain in joints of left hand: Secondary | ICD-10-CM | POA: Diagnosis not present

## 2023-06-05 DIAGNOSIS — M25442 Effusion, left hand: Secondary | ICD-10-CM | POA: Diagnosis not present

## 2023-06-05 DIAGNOSIS — M25642 Stiffness of left hand, not elsewhere classified: Secondary | ICD-10-CM | POA: Diagnosis not present

## 2023-06-09 DIAGNOSIS — N952 Postmenopausal atrophic vaginitis: Secondary | ICD-10-CM | POA: Diagnosis not present

## 2023-06-09 DIAGNOSIS — N815 Vaginal enterocele: Secondary | ICD-10-CM | POA: Diagnosis not present

## 2023-06-09 DIAGNOSIS — Z4689 Encounter for fitting and adjustment of other specified devices: Secondary | ICD-10-CM | POA: Diagnosis not present

## 2023-10-07 ENCOUNTER — Ambulatory Visit (INDEPENDENT_AMBULATORY_CARE_PROVIDER_SITE_OTHER): Payer: PPO | Admitting: Internal Medicine

## 2023-10-07 ENCOUNTER — Encounter: Payer: Self-pay | Admitting: Internal Medicine

## 2023-10-07 VITALS — BP 134/86 | HR 66 | Temp 98.0°F | Ht 65.0 in | Wt 161.0 lb

## 2023-10-07 DIAGNOSIS — U071 COVID-19: Secondary | ICD-10-CM | POA: Diagnosis not present

## 2023-10-07 MED ORDER — PROMETHAZINE-DM 6.25-15 MG/5ML PO SYRP: 5.0000 mL | ORAL_SOLUTION | Freq: Four times a day (QID) | ORAL | 0 refills | Status: DC | PRN

## 2023-10-07 NOTE — Progress Notes (Signed)
   Subjective:   Patient ID: Sara [name], female    DOB: 05-05-1951, 72 y.o.   MRN: [id number]  HPI The patient is a 72 YO female coming in for cold symptoms starting Friday. Tested positive for covid-19 Sunday. Overall improving.   Review of Systems  Constitutional:  Positive for activity change, appetite change and fever. Negative for chills, fatigue and unexpected weight change.  HENT:  Positive for congestion, postnasal drip, rhinorrhea and sinus pressure. Negative for ear discharge, ear pain, sinus pain, sneezing, sore throat, tinnitus, trouble swallowing and voice change.   Eyes: Negative.   Respiratory:  Positive for cough. Negative for chest tightness, shortness of breath and wheezing.   Cardiovascular: Negative.   Gastrointestinal: Negative.   Musculoskeletal:  Negative for myalgias.  Neurological: Negative.     Objective:  Physical Exam Constitutional:      Appearance: She is well-developed.  HENT:     Head: Normocephalic and atraumatic.     Comments: Oropharynx with redness and clear drainage, nose with swollen turbinates, TMs normal bilaterally.  Neck:     Thyroid: No thyromegaly.  Cardiovascular:     Rate and Rhythm: Normal rate and regular rhythm.  Pulmonary:     Effort: Pulmonary effort is normal. No respiratory distress.     Breath sounds: Normal breath sounds. No wheezing or rales.  Abdominal:     Palpations: Abdomen is soft.  Musculoskeletal:     Cervical back: Normal range of motion.  Lymphadenopathy:     Cervical: No cervical adenopathy.  Skin:    General: Skin is warm and dry.  Neurological:     Mental Status: She is alert and oriented to person, place, and time.     Vitals:   10/07/23 1041  BP: 134/86  Pulse: 66  Temp: 98 F (36.7 C)  TempSrc: Oral  SpO2: 98%  Weight: 161 lb (73 kg)  Height: 5\' 5"  (1.651 m)    Assessment & Plan:

## 2023-10-07 NOTE — Assessment & Plan Note (Signed)
Symptoms started 5 days ago with home test positive. Advised against paxlovid due to improvement and mild symptoms. Rx promethazine/dm cough syrup to help with symptoms and continue otc antihistamine.

## 2023-11-12 NOTE — Telephone Encounter (Signed)
Results abstracted and Care Team updated

## 2023-11-28 ENCOUNTER — Telehealth: Payer: Self-pay | Admitting: Internal Medicine

## 2023-11-28 NOTE — Telephone Encounter (Unsigned)
 Copied from CRM [id number]. Topic: Referral - Request for Referral >> Nov 28, 2023  2:20 PM Rolin D wrote: Did the patient discuss referral with their provider in the last year? Yes (If No - schedule appointment) (If Yes - send message)  Appointment offered? No  Type of order/referral and detailed reason for visit: Dermatologist   Preference of office, provider, location: Patient would like a provider closer to her area  If referral order, have you been seen by this specialty before? No (If Yes, this issue or another issue? When? Where?  Can we respond through MyChart? Yes

## 2023-12-01 NOTE — Telephone Encounter (Signed)
 Likely does not need referral can call on her own to schedule. Most derm are scheduling 6 months out with referral or not.

## 2023-12-15 DIAGNOSIS — Z01419 Encounter for gynecological examination (general) (routine) without abnormal findings: Secondary | ICD-10-CM | POA: Diagnosis not present

## 2023-12-15 DIAGNOSIS — Z1331 Encounter for screening for depression: Secondary | ICD-10-CM | POA: Diagnosis not present

## 2023-12-15 DIAGNOSIS — Z1231 Encounter for screening mammogram for malignant neoplasm of breast: Secondary | ICD-10-CM | POA: Diagnosis not present

## 2023-12-15 LAB — HM MAMMOGRAPHY

## 2023-12-17 ENCOUNTER — Other Ambulatory Visit: Payer: Self-pay | Admitting: Obstetrics & Gynecology

## 2023-12-17 DIAGNOSIS — M810 Age-related osteoporosis without current pathological fracture: Secondary | ICD-10-CM

## 2024-01-28 DIAGNOSIS — H0520 Unspecified exophthalmos: Secondary | ICD-10-CM | POA: Diagnosis not present

## 2024-01-28 DIAGNOSIS — H524 Presbyopia: Secondary | ICD-10-CM | POA: Diagnosis not present

## 2024-01-28 DIAGNOSIS — Z961 Presence of intraocular lens: Secondary | ICD-10-CM | POA: Diagnosis not present

## 2024-01-28 DIAGNOSIS — H26493 Other secondary cataract, bilateral: Secondary | ICD-10-CM | POA: Diagnosis not present

## 2024-01-28 DIAGNOSIS — H532 Diplopia: Secondary | ICD-10-CM | POA: Diagnosis not present

## 2024-03-04 DIAGNOSIS — M19042 Primary osteoarthritis, left hand: Secondary | ICD-10-CM | POA: Diagnosis not present

## 2024-03-04 DIAGNOSIS — W5501XA Bitten by cat, initial encounter: Secondary | ICD-10-CM | POA: Diagnosis not present

## 2024-03-10 ENCOUNTER — Ambulatory Visit

## 2024-03-10 VITALS — Ht 65.0 in | Wt 161.0 lb

## 2024-03-10 DIAGNOSIS — Z0000 Encounter for general adult medical examination without abnormal findings: Secondary | ICD-10-CM | POA: Diagnosis not present

## 2024-03-10 DIAGNOSIS — Z1212 Encounter for screening for malignant neoplasm of rectum: Secondary | ICD-10-CM | POA: Diagnosis not present

## 2024-03-10 DIAGNOSIS — Z1211 Encounter for screening for malignant neoplasm of colon: Secondary | ICD-10-CM | POA: Diagnosis not present

## 2024-03-10 NOTE — Patient Instructions (Addendum)
 Sara [name] , Thank you for taking time to come for your Medicare Wellness Visit. I appreciate your ongoing commitment to your health goals. Please review the following plan we discussed and let me know if I can assist you in the future.   Referrals/Orders/Follow-Ups/Clinician Recommendations: It was nice talking to you today.  You are due for a Shingles vaccine. You are also due for a colonoscopy.  Please call [facility],  [address] [place] [address], [place], [place] [address], at [phone], to get scheduled for a colonoscopy.  Aim for 30 minutes of exercise or brisk walking, 6-8 glasses of water, and 5 servings of fruits and vegetables each day.   This is a list of the screening recommended for you and due dates:  Health Maintenance  Topic Date Due   Zoster (Shingles) Vaccine (1 of 2) 12/01/1999   COVID-19 Vaccine (6 - 2024-25 season) 07/27/2023   Mammogram  08/25/2023   Flu Shot  06/25/2024   Colon Cancer Screening  10/24/2024   Medicare Annual Wellness Visit  03/10/2025   DTaP/Tdap/Td vaccine (6 - Td or Tdap) 09/17/2031   Pneumonia Vaccine  Completed   DEXA scan (bone density measurement)  Completed   Hepatitis C Screening  Completed   HPV Vaccine  Aged Out   Meningitis B Vaccine  Aged Out    Advanced directives: (Copy Requested) Please bring a copy of your health care power of attorney and living will to the office to be added to your chart at your convenience. You can mail to [facility] [address] [place], [place] [address] or email to [email or web address]  Next Medicare Annual Wellness Visit scheduled for next year: Yes

## 2024-03-10 NOTE — Progress Notes (Signed)
 Subjective:   Sara [name] is a 73 y.o. who presents for a Medicare Wellness preventive visit.  Visit Complete: Virtual I connected with  [name] on 03/10/24 by a video and audio enabled telemedicine application and verified that I am speaking with the correct person using two identifiers.  Patient Location: Home  Provider Location: Home Office  I discussed the limitations of evaluation and management by telemedicine. The patient expressed understanding and agreed to proceed.  Vital Signs: Because this visit was a virtual/telehealth visit, some criteria may be missing or patient reported. Any vitals not documented were not able to be obtained and vitals that have been documented are patient reported.   Persons Participating in Visit: Patient.  AWV Questionnaire: Yes: Patient Medicare AWV questionnaire was completed by the patient on 03/06/2024; I have confirmed that all information answered by patient is correct and no changes since this date.  Cardiac Risk Factors include: advanced age (>58men, >53 women)     Objective:    Today's Vitals   03/10/24 0857  Weight: 161 lb (73 kg)  Height: 5\' 5"  (1.651 m)   Body mass index is 26.79 kg/m.     03/10/2024    9:04 AM 04/15/2023   11:22 AM 02/25/2023   11:00 PM 02/25/2023   12:27 PM 10/25/2022    2:33 PM 03/25/2022    9:45 AM 09/10/2021    9:24 AM  Advanced Directives  Does Patient Have a Medical Advance Directive? Yes Yes No No Yes Yes Yes  Type of Estate agent of [place];Living will Healthcare Power of [place];Living will   Healthcare Power of [place];Living will;Out of facility DNR (pink MOST or yellow form) Healthcare Power of [place];Living will Healthcare Power of [place];Living will;Out of facility DNR (pink MOST or yellow form)  Copy of Healthcare Power of Attorney in Chart? No - copy requested No - copy requested    No - copy requested   Would patient like information on creating a medical  advance directive?   No - Patient declined No - Patient declined       Current Medications (verified) Outpatient Encounter Medications as of 03/10/2024  Medication Sig   estradiol (ESTRACE) 0.1 MG/GM vaginal cream Place 0.5 g vaginally 2 (two) times a week.   famotidine (PEPCID) 40 MG tablet Take 1 tablet (40 mg total) by mouth daily.   Multiple Vitamin (MULTIVITAMIN ADULT PO) Take 1 tablet by mouth daily.   oxybutynin (DITROPAN-XL) 10 MG 24 hr tablet Take 10 mg by mouth daily.   promethazine-dextromethorphan (PROMETHAZINE-DM) 6.25-15 MG/5ML syrup Take 5 mLs by mouth 4 (four) times daily as needed.   No facility-administered encounter medications on file as of 03/10/2024.    Allergies (verified) Tramadol   History: Past Medical History:  Diagnosis Date   Anemia    as child   ANXIETY    Cataract    bilateral-removed   Esophageal stricture    GERD (gastroesophageal reflux disease)    Osteoporosis    PLANTAR FASCIITIS, BILATERAL    PONV (postoperative nausea and vomiting)    SUPRAVENTRICULAR TACHYCARDIA    Urge incontinence    Past Surgical History:  Procedure Laterality Date   Adenosine Myoview  03/14/05   Cather ablation  04/2007   EYE SURGERY Bilateral 10-2010   cataracts with lens replacments   HERNIA REPAIR  15 yrs ago   I & D EXTREMITY Left 02/26/2023   Procedure: IRRIGATION AND DEBRIDEMENT LEFT INDEX FINGER;  Surgeon: Izora Ribas,  Ramiro Burly, MD;  Location: [facility] OR;  Service: Plastics;  Laterality: Left;   NECK LIFT  05/2014   plastic  surgery for a neck lift   VAGINAL HYSTERECTOMY  yrs ago   Family History  Problem Relation Age of Onset   Arthritis Mother    Hypertension Mother    Coronary artery disease Father    Heart disease Father    Breast cancer Maternal Grandmother    Stroke Paternal Grandmother    Coronary artery disease Paternal Grandfather    Stomach cancer Neg Hx    Colon cancer Neg Hx    Colon polyps Neg Hx    Esophageal cancer Neg Hx    Rectal cancer Neg  Hx    Social History   Socioeconomic History   Marital status: Married    Spouse name: John   Number of children: 0   Years of education: Not on file   Highest education level: Associate degree: academic program  Occupational History   Occupation: Printmaker: [employer]   Occupation: SEMI RETIRED  Tobacco Use   Smoking status: Never   Smokeless tobacco: Never  Vaping Use   Vaping status: Never Used  Substance and Sexual Activity   Alcohol use: Yes    Alcohol/week: 1.0 standard drink of alcohol    Types: 1 Glasses of wine per week    Comment: occasionally   Drug use: No   Sexual activity: Not Currently    Partners: Male  Other Topics Concern   Not on file  Social History Narrative   1 step daughter, 2 grandchildren and 1 great grandchild   Lives with husband and 2 cats   Social Drivers of Corporate investment banker Strain: Low Risk  (03/06/2024)   Overall Financial Resource Strain (CARDIA)    Difficulty of Paying Living Expenses: Not hard at all  Food Insecurity: No Food Insecurity (03/06/2024)   Hunger Vital Sign    Worried About Running Out of Food in the Last Year: Never true    Ran Out of Food in the Last Year: Never true  Transportation Needs: No Transportation Needs (03/06/2024)   PRAPARE - Administrator, Civil Service (Medical): No    Lack of Transportation (Non-Medical): No  Physical Activity: Inactive (03/06/2024)   Exercise Vital Sign    Days of Exercise per Week: 0 days    Minutes of Exercise per Session: 0 min  Stress: No Stress Concern Present (03/06/2024)   [employer] of Occupational Health - Occupational Stress Questionnaire    Feeling of Stress : Only a little  Social Connections: Socially Integrated (03/06/2024)   Social Connection and Isolation Panel [NHANES]    Frequency of Communication with Friends and Family: More than three times a week    Frequency of Social Gatherings with Friends and Family: Twice a  week    Attends Religious Services: More than 4 times per year    Active Member of [employer] or Organizations: Yes    Attends Banker Meetings: 1 to 4 times per year    Marital Status: Married    Tobacco Counseling Counseling given: Not Answered    Clinical Intake:  Pre-visit preparation completed: Yes  Pain : No/denies pain     BMI - recorded: 26.79 Nutritional Status: BMI 25 -29 Overweight Nutritional Risks: None Diabetes: No  No results found for: "HGBA1C"   How often do you need to have someone help you when you read  instructions, pamphlets, or other written materials from your doctor or pharmacy?: 1 - Never  Interpreter Needed?: No  Information entered by :: Bonnetta Allbee, RMA   Activities of Daily Living     03/10/2024    9:01 AM 04/15/2023   11:22 AM  In your present state of health, do you have any difficulty performing the following activities:  Hearing? 0 0  Vision? 0 0  Difficulty concentrating or making decisions? 0 0  Walking or climbing stairs? 0 0  Dressing or bathing? 0 0  Doing errands, shopping? 0 0  Preparing Food and eating ? N N  Using the Toilet? N N  In the past six months, have you accidently leaked urine? Y Y  Comment Urge incontinence   Do you have problems with loss of bowel control? N N  Managing your Medications? N N  Managing your Finances? N N  Housekeeping or managing your Housekeeping? N N    Patient Care Team: Myrlene Broker, MD as PCP - General (Internal Medicine) Shea Evans, MD as Consulting Physician (Obstetrics and Gynecology) Glendale Chard, DO as Consulting Physician (Neurology) [facility], [facility] (Ophthalmology)  Indicate any recent Medical Services you may have received from other than [facility] providers in the past year (date may be approximate).     Assessment:   This is a routine wellness examination for [place].  Hearing/Vision screen Hearing Screening - Comments:: Denies hearing  difficulties   Vision Screening - Comments:: Implants   Goals Addressed             This Visit's Progress    My goal for 2024 is to get back into shape by walking and exercising more.   Not on track      Depression Screen     03/10/2024    9:07 AM 10/07/2023   10:49 AM 04/15/2023   11:23 AM 02/14/2023   11:31 AM 05/14/2022    8:58 AM 03/25/2022    9:44 AM 03/26/2021    1:39 PM  PHQ 2/9 Scores  PHQ - 2 Score 0 0 0 0 1 0 0  PHQ- 9 Score 1  0  4      Fall Risk     03/10/2024    9:04 AM 10/07/2023   10:48 AM 04/15/2023   11:24 AM 04/10/2023    8:34 PM 02/14/2023   11:31 AM  Fall Risk   Falls in the past year? 1 0 1 1 0  Number falls in past yr: 1 0 0 0 0  Injury with Fall? 0 0 0 0 0  Risk for fall due to :   No Fall Risks  No Fall Risks  Follow up Falls evaluation completed;Falls prevention discussed Falls evaluation completed   Falls evaluation completed    MEDICARE RISK AT HOME:  Medicare Risk at Home Any stairs in or around the home?: Yes If so, are there any without handrails?: Yes Home free of loose throw rugs in walkways, pet beds, electrical cords, etc?: Yes Adequate lighting in your home to reduce risk of falls?: Yes Life alert?: No Use of a cane, walker or w/c?: No Grab bars in the bathroom?: Yes Shower chair or bench in shower?: Yes Elevated toilet seat or a handicapped toilet?: Yes  TIMED UP AND GO:  Was the test performed?  No  Cognitive Function: Normal: Normal cognitive status assessed by direct observation by this Clinical Health Advisor. No abnormalities found. Patient is able to answer questions in  an accurate and timely manner.    12/30/2016    5:09 PM  MMSE - Mini Mental State Exam  Orientation to time 5  Orientation to Place 5  Registration 3  Attention/ Calculation 5  Recall 3  Language- name 2 objects 2  Language- repeat 1  Language- follow 3 step command 3  Language- read & follow direction 1  Write a sentence 1  Copy design 1  Total  score 30        04/15/2023   11:23 AM  6CIT Screen  What Year? 0 points  What month? 0 points  What time? 0 points  Count back from 20 0 points  Months in reverse 0 points  Repeat phrase 0 points  Total Score 0 points    Immunizations Immunization History  Administered Date(s) Administered   Fluad Quad(high Dose 65+) 08/30/2021, 09/06/2022   Influenza Split 11/13/2011   Influenza Whole 09/06/2009, 08/25/2012   Influenza, High Dose Seasonal PF 08/25/2017, 08/28/2018, 08/19/2023   Influenza,inj,Quad PF,6+ Mos 09/16/2013, 12/09/2014, 12/19/2015   Influenza-Unspecified 08/26/2017, 09/09/2022   PFIZER Comirnaty(Gray Top)Covid-19 Tri-Sucrose Vaccine 09/04/2020, 04/11/2021   PFIZER(Purple Top)SARS-COV-2 Vaccination 12/06/2019, 12/27/2019   Pfizer Covid-19 Vaccine Bivalent Booster 77yrs & up 09/19/2021   Pneumococcal Conjugate-13 03/21/2014   Pneumococcal Polysaccharide-23 12/10/1999, 03/25/2012, 12/19/2015   Td 11/25/1998, 11/25/2008, 09/06/2009   Td (Adult), 2 Lf Tetanus Toxid, Preservative Free 11/25/2008   Tdap 09/16/2021   Zoster, Live 01/23/2010, 08/25/2010    Screening Tests Health Maintenance  Topic Date Due   Zoster Vaccines- Shingrix (1 of 2) 12/01/1999   COVID-19 Vaccine (6 - 2024-25 season) 07/27/2023   MAMMOGRAM  08/25/2023   INFLUENZA VACCINE  06/25/2024   Colonoscopy  10/24/2024   Medicare Annual Wellness (AWV)  03/10/2025   DTaP/Tdap/Td (6 - Td or Tdap) 09/17/2031   Pneumonia Vaccine 43+ Years old  Completed   DEXA SCAN  Completed   Hepatitis C Screening  Completed   HPV VACCINES  Aged Out   Meningococcal B Vaccine  Aged Out    Health Maintenance  Health Maintenance Due  Topic Date Due   Zoster Vaccines- Shingrix (1 of 2) 12/01/1999   COVID-19 Vaccine (6 - 2024-25 season) 07/27/2023   MAMMOGRAM  08/25/2023   Health Maintenance Items Addressed: Referral sent to GI for colonoscopy, See Nurse Notes  Additional Screening:  Vision Screening:  Recommended annual ophthalmology exams for early detection of glaucoma and other disorders of the eye.  Dental Screening: Recommended annual dental exams for proper oral hygiene  Community Resource Referral / Chronic Care Management: CRR required this visit?  No   CCM required this visit?  No     Plan:     I have personally reviewed and noted the following in the patient's chart:   Medical and social history Use of alcohol, tobacco or illicit drugs  Current medications and supplements including opioid prescriptions. Patient is not currently taking opioid prescriptions. Functional ability and status Nutritional status Physical activity Advanced directives List of other physicians Hospitalizations, surgeries, and ER visits in previous 12 months Vitals Screenings to include cognitive, depression, and falls Referrals and appointments  In addition, I have reviewed and discussed with patient certain preventive protocols, quality metrics, and best practice recommendations. A written personalized care plan for preventive services as well as general preventive health recommendations were provided to patient.     Kaynen Minner L Ali Mohl, CMA   03/10/2024   After Visit Summary: (MyChart) Due to this being a telephonic visit,  the after visit summary with patients personalized plan was offered to patient via MyChart   Notes: Please refer to Routing Comments.

## 2024-03-19 ENCOUNTER — Ambulatory Visit: Admitting: Nurse Practitioner

## 2024-03-19 ENCOUNTER — Encounter: Payer: Self-pay | Admitting: Nurse Practitioner

## 2024-03-19 ENCOUNTER — Ambulatory Visit: Payer: Self-pay | Admitting: Internal Medicine

## 2024-03-19 ENCOUNTER — Ambulatory Visit

## 2024-03-19 VITALS — BP 126/76 | HR 69 | Temp 98.3°F | Ht 65.0 in | Wt 164.2 lb

## 2024-03-19 DIAGNOSIS — R059 Cough, unspecified: Secondary | ICD-10-CM

## 2024-03-19 LAB — CBC WITH DIFFERENTIAL/PLATELET
Basophils Absolute: 0 10*3/uL (ref 0.0–0.1)
Basophils Relative: 0.4 % (ref 0.0–3.0)
Eosinophils Absolute: 0.2 10*3/uL (ref 0.0–0.7)
Eosinophils Relative: 1.9 % (ref 0.0–5.0)
HCT: 38.2 % (ref 36.0–46.0)
Hemoglobin: 12.8 g/dL (ref 12.0–15.0)
Lymphocytes Relative: 17.3 % (ref 12.0–46.0)
Lymphs Abs: 1.8 10*3/uL (ref 0.7–4.0)
MCHC: 33.4 g/dL (ref 30.0–36.0)
MCV: 93.8 fl (ref 78.0–100.0)
Monocytes Absolute: 0.9 10*3/uL (ref 0.1–1.0)
Monocytes Relative: 8.7 % (ref 3.0–12.0)
Neutro Abs: 7.3 10*3/uL (ref 1.4–7.7)
Neutrophils Relative %: 71.7 % (ref 43.0–77.0)
Platelets: 210 10*3/uL (ref 150.0–400.0)
RBC: 4.07 Mil/uL (ref 3.87–5.11)
RDW: 13.3 % (ref 11.5–15.5)
WBC: 10.1 10*3/uL (ref 4.0–10.5)

## 2024-03-19 LAB — BASIC METABOLIC PANEL WITH GFR
BUN: 11 mg/dL (ref 6–23)
CO2: 31 meq/L (ref 19–32)
Calcium: 9.2 mg/dL (ref 8.4–10.5)
Chloride: 103 meq/L (ref 96–112)
Creatinine, Ser: 0.6 mg/dL (ref 0.40–1.20)
GFR: 88.5 mL/min (ref >60.00)
Glucose, Bld: 83 mg/dL (ref 70–99)
Potassium: 4 meq/L (ref 3.5–5.1)
Sodium: 140 meq/L (ref 135–145)

## 2024-03-19 LAB — POCT RESPIRATORY SYNCYTIAL VIRUS: RSV Rapid Ag: NEGATIVE

## 2024-03-19 LAB — POC COVID19 BINAXNOW: SARS Coronavirus 2 Ag: NEGATIVE

## 2024-03-19 LAB — POCT INFLUENZA A/B
Influenza A, POC: NEGATIVE
Influenza B, POC: NEGATIVE

## 2024-03-19 NOTE — Progress Notes (Signed)
 Established Patient Office Visit  Subjective   Patient ID: Sara [name], female    DOB: 1951/11/22  Age: 73 y.o. MRN: [id number]  Chief Complaint  Patient presents with   Nasal Congestion    Nasal and chest congestion, been going on for about 4 days     [name] is a 3 old female with medical history significant for GERD, osteoporosis, anxiety.  She arrives today for acute visit.  She had sudden onset of fever, cough, congestion, headache yesterday.  Last week was at a wedding and exposed to many different people and she is concerned she may have contracted an infection.  She has another event to go to this weekend and wants to make sure she is not contagious for it.  She reports overall today she is feeling a bit better.    Review of Systems  Constitutional:  Positive for fever.  HENT:  Positive for congestion.   Respiratory:  Positive for cough and sputum production. Negative for shortness of breath and wheezing.   Cardiovascular:  Negative for chest pain.  Musculoskeletal:  Negative for myalgias.  Neurological:  Positive for headaches.      Objective:     BP 126/76   Pulse 69   Temp 98.3 F (36.8 C) (Temporal)   Ht 5\' 5"  (1.651 m)   Wt 164 lb 4 oz (74.5 kg)   SpO2 97%   BMI 27.33 kg/m    Physical Exam Vitals reviewed.  Constitutional:      General: She is not in acute distress.    Appearance: Normal appearance.  HENT:     Head: Normocephalic and atraumatic.  Neck:     Vascular: No carotid bruit.  Cardiovascular:     Rate and Rhythm: Normal rate and regular rhythm.     Pulses: Normal pulses.     Heart sounds: Normal heart sounds.  Pulmonary:     Effort: Pulmonary effort is normal.     Breath sounds: Examination of the right-lower field reveals rhonchi. Examination of the left-lower field reveals rhonchi. Rhonchi present.  Skin:    General: Skin is warm and dry.  Neurological:     General: No focal deficit present.     Mental Status: She is alert  and oriented to person, place, and time.  Psychiatric:        Mood and Affect: Mood normal.        Behavior: Behavior normal.        Judgment: Judgment normal.      Results for orders placed or performed in visit on 03/19/24  POC COVID-19 BinaxNow  Result Value Ref Range   SARS Coronavirus 2 Ag Negative Negative  POCT Influenza A/B  Result Value Ref Range   Influenza A, POC Negative Negative   Influenza B, POC Negative Negative  POCT respiratory syncytial virus  Result Value Ref Range   RSV Rapid Ag negative       The 10-year ASCVD risk score (Arnett DK, et al., 2019) is: 14%    Assessment & Plan:   Problem List Items Addressed This Visit       Other   Cough - Primary   Acute, VSS, oxygen saturation 97% on RA.  Etiology unclear, likely viral. POC covid, RSV, flu all negative Patient denies usually experiencing seasonal allergies Treat symptoms with rest, hydration, Mucinex as needed, Tylenol  or Advil as needed for fever/pain.  If symptoms persist or worsen patient to call office for further evaluation. Will  also collect chest x-ray as some crackles/rhonchi were identified on physical exam to rule out pneumonia.      Relevant Orders   DG Chest 2 View   CBC with Differential/Platelet   Basic metabolic panel with GFR   POC [id number]-32 BinaxNow (Completed)   POCT Influenza A/B (Completed)   POCT respiratory syncytial virus (Completed)    Return if symptoms worsen or fail to improve.    Zorita Hiss, NP

## 2024-03-19 NOTE — Assessment & Plan Note (Addendum)
 Acute, VSS, oxygen saturation 97% on RA.  Etiology unclear, likely viral. POC covid, RSV, flu all negative Patient denies usually experiencing seasonal allergies Treat symptoms with rest, hydration, Mucinex as needed, Tylenol  or Advil as needed for fever/pain.  If symptoms persist or worsen patient to call office for further evaluation. Will also collect chest x-ray as some crackles/rhonchi were identified on physical exam to rule out pneumonia.

## 2024-03-19 NOTE — Telephone Encounter (Signed)
 Chief Complaint: Productive cough since Tuesday  Symptoms: Had a fever, lack of appetite, congestion Pertinent Negatives: Patient denies chills  Disposition: [x] Appointment(In office)  Additional Notes: Patient scheduled for an appointment today in office. This RN educated pt on  new-worsening symptoms and when to call back/seek emergent care. Pt verbalized understanding and agrees to plan.    Copied from CRM [id number]. Topic: Clinical - Red Word Triage >> Mar 19, 2024  9:36 AM Keitha Pata L wrote: [facility] that prompted transfer to Nurse Triage: 101.5 fever, flu like symptoms, cough, congestion, lost of appetite, foggy head and a neg covid test Reason for Disposition  Fever present > 3 days (72 hours)  Answer Assessment - Initial Assessment Questions TEMPERATURE:      101 F, 98.4 last night; 98 F right now   Chief Complaint: Productive cough since Tuesday  Symptoms: Had a fever, lack of appetite, congestion Pertinent Negatives: Patient denies chills  Protocols used: Fever-A-AH

## 2024-05-18 ENCOUNTER — Encounter: Admitting: Internal Medicine

## 2024-05-20 ENCOUNTER — Encounter: Admitting: Internal Medicine

## 2024-06-01 ENCOUNTER — Ambulatory Visit (INDEPENDENT_AMBULATORY_CARE_PROVIDER_SITE_OTHER): Admitting: Internal Medicine

## 2024-06-01 VITALS — BP 134/70 | HR 58 | Temp 98.1°F | Ht 65.0 in | Wt 162.0 lb

## 2024-06-01 DIAGNOSIS — Z13220 Encounter for screening for lipoid disorders: Secondary | ICD-10-CM | POA: Diagnosis not present

## 2024-06-01 DIAGNOSIS — M810 Age-related osteoporosis without current pathological fracture: Secondary | ICD-10-CM

## 2024-06-01 DIAGNOSIS — M25569 Pain in unspecified knee: Secondary | ICD-10-CM | POA: Diagnosis not present

## 2024-06-01 DIAGNOSIS — G8929 Other chronic pain: Secondary | ICD-10-CM

## 2024-06-01 DIAGNOSIS — R2689 Other abnormalities of gait and mobility: Secondary | ICD-10-CM

## 2024-06-01 DIAGNOSIS — K219 Gastro-esophageal reflux disease without esophagitis: Secondary | ICD-10-CM

## 2024-06-01 DIAGNOSIS — Z0000 Encounter for general adult medical examination without abnormal findings: Secondary | ICD-10-CM | POA: Diagnosis not present

## 2024-06-01 LAB — COMPREHENSIVE METABOLIC PANEL WITH GFR
ALT: 16 U/L (ref 0–35)
AST: 20 U/L (ref 0–37)
Albumin: 4.2 g/dL (ref 3.5–5.2)
Alkaline Phosphatase: 58 U/L (ref 39–117)
BUN: 15 mg/dL (ref 6–23)
CO2: 30 meq/L (ref 19–32)
Calcium: 9.4 mg/dL (ref 8.4–10.5)
Chloride: 104 meq/L (ref 96–112)
Creatinine, Ser: 0.78 mg/dL (ref 0.40–1.20)
GFR: 74.78 mL/min (ref >60.00)
Glucose, Bld: 94 mg/dL (ref 70–99)
Potassium: 4.5 meq/L (ref 3.5–5.1)
Sodium: 140 meq/L (ref 135–145)
Total Bilirubin: 0.6 mg/dL (ref 0.2–1.2)
Total Protein: 7 g/dL (ref 6.0–8.3)

## 2024-06-01 LAB — TSH: TSH: 1.2 u[IU]/mL (ref 0.35–5.50)

## 2024-06-01 LAB — LIPID PANEL
Cholesterol: 195 mg/dL (ref 0–200)
HDL: 63.7 mg/dL (ref >39.00)
LDL Cholesterol: 107 mg/dL — ABNORMAL HIGH (ref 0–99)
NonHDL: 131.56
Total CHOL/HDL Ratio: 3
Triglycerides: 125 mg/dL (ref 0.0–149.0)
VLDL: 25 mg/dL (ref 0.0–40.0)

## 2024-06-01 LAB — CBC
HCT: 41.7 % (ref 36.0–46.0)
Hemoglobin: 13.9 g/dL (ref 12.0–15.0)
MCHC: 33.3 g/dL (ref 30.0–36.0)
MCV: 92.5 fl (ref 78.0–100.0)
Platelets: 213 K/uL (ref 150.0–400.0)
RBC: 4.51 Mil/uL (ref 3.87–5.11)
RDW: 13.7 % (ref 11.5–15.5)
WBC: 4.9 K/uL (ref 4.0–10.5)

## 2024-06-01 LAB — VITAMIN D 25 HYDROXY (VIT D DEFICIENCY, FRACTURES): VITD: 28.81 ng/mL — ABNORMAL LOW (ref 30.00–100.00)

## 2024-06-01 NOTE — Patient Instructions (Signed)
 Physical therapy to help with balance

## 2024-06-01 NOTE — Progress Notes (Signed)
   Subjective:   Patient ID: Sara [name], female    DOB: 1951-08-05, 73 y.o.   MRN: [id number]  HPI The patient is here for physical.  PMH, [facility], social history reviewed and updated  Review of Systems  Constitutional: Negative.   HENT: Negative.    Eyes: Negative.   Respiratory:  Negative for cough, chest tightness and shortness of breath.   Cardiovascular:  Negative for chest pain, palpitations and leg swelling.  Gastrointestinal:  Negative for abdominal distention, abdominal pain, constipation, diarrhea, nausea and vomiting.  Musculoskeletal:  Positive for arthralgias.  Skin: Negative.   Neurological: Negative.   Psychiatric/Behavioral: Negative.      Objective:  Physical Exam Constitutional:      Appearance: She is well-developed.  HENT:     Head: Normocephalic and atraumatic.  Cardiovascular:     Rate and Rhythm: Normal rate and regular rhythm.  Pulmonary:     Effort: Pulmonary effort is normal. No respiratory distress.     Breath sounds: Normal breath sounds. No wheezing or rales.  Abdominal:     General: Bowel sounds are normal. There is no distension.     Palpations: Abdomen is soft.     Tenderness: There is no abdominal tenderness. There is no rebound.  Musculoskeletal:     Cervical back: Normal range of motion.  Skin:    General: Skin is warm and dry.  Neurological:     Mental Status: She is alert and oriented to person, place, and time.     Coordination: Coordination normal.     Vitals:   06/01/24 1001  BP: 134/70  Pulse: (!) 58  Temp: 98.1 F (36.7 C)  TempSrc: Oral  SpO2: 97%  Weight: 162 lb (73.5 kg)  Height: 5' 5 (1.651 m)    Assessment & Plan:

## 2024-06-04 ENCOUNTER — Ambulatory Visit: Attending: Internal Medicine

## 2024-06-04 ENCOUNTER — Other Ambulatory Visit: Payer: Self-pay

## 2024-06-04 DIAGNOSIS — R262 Difficulty in walking, not elsewhere classified: Secondary | ICD-10-CM | POA: Insufficient documentation

## 2024-06-04 DIAGNOSIS — G8929 Other chronic pain: Secondary | ICD-10-CM | POA: Insufficient documentation

## 2024-06-04 DIAGNOSIS — M6281 Muscle weakness (generalized): Secondary | ICD-10-CM | POA: Insufficient documentation

## 2024-06-04 DIAGNOSIS — R2689 Other abnormalities of gait and mobility: Secondary | ICD-10-CM | POA: Diagnosis not present

## 2024-06-04 DIAGNOSIS — M25569 Pain in unspecified knee: Secondary | ICD-10-CM | POA: Insufficient documentation

## 2024-06-04 NOTE — Assessment & Plan Note (Signed)
 Flu shot yearly. Pneumonia complete. Shingrix counseled due. Tetanus up to date. Colonoscopy up to date. Mammogram up to date, pap smear aged out and dexa due this year or next. Counseled about sun safety and mole surveillance. Counseled about the dangers of distracted driving. Given 10 year screening recommendations.

## 2024-06-04 NOTE — Assessment & Plan Note (Signed)
 Taking pepcid  and overall controlled.

## 2024-06-04 NOTE — Therapy (Signed)
 OUTPATIENT PHYSICAL THERAPY EVALUATION   Patient Name: Sara [name] MRN: [id number] DOB:1951-04-21, 73 y.o., female Today's Date: 06/04/2024  END OF SESSION:  Visit Number 1 Number of Visits 16 Date for PT re-eval 07/30/2024  Authorization Type HTA  PT start time 1217 PT stop time 1300 PT time calculation (min) 43 min   Past Medical History:  Diagnosis Date   Anemia    as child   ANXIETY    Cataract    bilateral-removed   Esophageal stricture    GERD (gastroesophageal reflux disease)    Osteoporosis    PLANTAR FASCIITIS, BILATERAL    PONV (postoperative nausea and vomiting)    SUPRAVENTRICULAR TACHYCARDIA    Urge incontinence    Past Surgical History:  Procedure Laterality Date   Adenosine Myoview  03/14/05   Cather ablation  04/2007   EYE SURGERY Bilateral 10-2010   cataracts with lens replacments   HERNIA REPAIR  15 yrs ago   I & D EXTREMITY Left 02/26/2023   Procedure: IRRIGATION AND DEBRIDEMENT LEFT INDEX FINGER;  Surgeon: Lorretta Dess, MD;  Location: [facility] OR;  Service: Plastics;  Laterality: Left;   NECK LIFT  05/2014   plastic  surgery for a neck lift   VAGINAL HYSTERECTOMY  yrs ago   Patient Active Problem List   Diagnosis Date Noted   Chronic knee pain 06/04/2024   Osteoporosis 12/24/2021   Bunion 04/17/2015   Hammer toe of left foot 04/17/2015   Routine general medical examination at a health care facility 01/13/2013   GERD (gastroesophageal reflux disease) 12/04/2012   Urge incontinence 03/01/2009    PCP: Rollene Almarie LABOR, MD   REFERRING PROVIDER: Rollene Almarie LABOR, MD   REFERRING DIAG:  R26.89 (ICD-10-CM) - Balance disorder  [phone] (ICD-10-CM) - Chronic knee pain, unspecified laterality    THERAPY DIAG:  Difficulty in walking, not elsewhere classified  Muscle weakness (generalized)  Rationale for Evaluation and Treatment: Rehabilitation  ONSET DATE: 06/01/2024 date of referral  SUBJECTIVE:   SUBJECTIVE  STATEMENT: Patient reports to PT today d/t challenges with her balance. She had 2 falls in the past year d/t walking in the rain or mud. She reports that she's noticed difficulties with walking in a straight line and is worried about future falls.   balance, strength, endurance   PERTINENT HISTORY: Relevant PMHx includes anxiety, osteoporosis, BIL plantar fasciitis, supraventricular tachycardia, chronic knee pain, L hammer toe   PAIN:  Are you having pain? No   PRECAUTIONS: None  RED FLAGS: None   WEIGHT BEARING RESTRICTIONS: No  FALLS:  Has patient fallen in last 6 months? Yes. Number of falls 1  LIVING ENVIRONMENT: Lives with: lives with their family Lives in: House/apartment Stairs: Yes: Internal: 1 flight steps with railing and External: 5 steps railing Has following equipment at home: shower chair and Grab bars  OCCUPATION: retired; Conservator, museum/gallery   PLOF: Independent  PATIENT GOALS: to improve balance, decrease risk of falls  NEXT MD VISIT: 06/07/2024 with PCP  OBJECTIVE:  Note: Objective measures were completed at Evaluation unless otherwise noted.   PATIENT SURVEYS:   LEFS 46/80  PSFS score: 2 (average)   1. Walk in straight line - 2  2. Balance (worried about falling) - 2  3. Arm Strength - 2    COGNITION: Overall cognitive status: Within functional limits for tasks assessed      POSTURE: rounded shoulders and forward head   LOWER EXTREMITY ROM:  Active ROM Right eval Left eval  Hip flexion    Hip extension    Hip abduction    Hip adduction    Hip internal rotation    Hip external rotation    Knee flexion    Knee extension    Ankle dorsiflexion    Ankle plantarflexion    Ankle inversion    Ankle eversion     (Blank rows = not tested)  LOWER EXTREMITY MMT:  MMT Right eval Left eval  Hip flexion 4 4  Hip extension    Hip abduction 4 4  Hip adduction 4 4  Hip internal rotation    Hip external rotation    Knee flexion 5 5  Knee  extension 5 5  Ankle dorsiflexion    Ankle plantarflexion    Ankle inversion    Ankle eversion     (Blank rows = not tested)   FUNCTIONAL TESTS:   Balance    Romberg EO on floor  30s  Romberg EC on floor 30s  Romberg EO on airex 30s  Romberg EC on airex        20s    Balance Right Left   Tandem on floor 12s 15s  SLS on floor  4s 3 s       Functional Gait Assessment - 06/07/24 0001     Gait Level Surface Walks 20 ft in less than 5.5 sec, no assistive devices, good speed, no evidence for imbalance, normal gait pattern, deviates no more than 6 in outside of the 12 in walkway width.    Change in Gait Speed Able to change speed, demonstrates mild gait deviations, deviates 6-10 in outside of the 12 in walkway width, or no gait deviations, unable to achieve a major change in velocity, or uses a change in velocity, or uses an assistive device.    Gait with Horizontal Head Turns Performs head turns smoothly with slight change in gait velocity (eg, minor disruption to smooth gait path), deviates 6-10 in outside 12 in walkway width, or uses an assistive device.    Gait with Vertical Head Turns Performs task with slight change in gait velocity (eg, minor disruption to smooth gait path), deviates 6 - 10 in outside 12 in walkway width or uses assistive device    Gait and Pivot Turn Pivot turns safely in greater than 3 sec and stops with no loss of balance, or pivot turns safely within 3 sec and stops with mild imbalance, requires small steps to catch balance.    Step Over Obstacle Is able to step over one shoe box (4.5 in total height) without changing gait speed. No evidence of imbalance.    Gait with Narrow Base of Support Ambulates 4-7 steps.    Gait with Eyes Closed Walks 20 ft, slow speed, abnormal gait pattern, evidence for imbalance, deviates 10-15 in outside 12 in walkway width. Requires more than 9 sec to ambulate 20 ft.    Ambulating Backwards Walks 20 ft, uses assistive device, slower  speed, mild gait deviations, deviates 6-10 in outside 12 in walkway width.    Steps Alternating feet, must use rail.    Total Score 19  TREATMENT DATE:   [facility] Adult PT Treatment:                                                DATE: 06/04/2024   Initial evaluation: see patient education and home exercise program as noted below      PATIENT EDUCATION:  Education details: reviewed initial home exercise program; discussion of POC, prognosis and goals for skilled PT   Person educated: Patient Education method: Explanation, Demonstration, and Handouts Education comprehension: verbalized understanding, returned demonstration, and needs further education  HOME EXERCISE PROGRAM: Access Code: [email or web address] URL: [email or web address] Date: 07/111/2025 Prepared by: Marko Molt  Exercises - Standing Hip Abduction with Counter Support  - 1 x daily - 3 x weekly - 2 sets - 10 reps - Standing Hip Extension with Counter Support  - 1 x daily - 3 x weekly - 2 sets - 10 reps - 3 sec hold - Standing March with Counter Support  - 1 x daily - 3 x weekly - 1 sets - 15 reps - 3 sec hold - Tandem Stance in Corner  - 1 x daily - 3 x weekly - 3 sets  ASSESSMENT:  CLINICAL IMPRESSION: Zailyn is a 73 y.o. female who was seen today for physical therapy evaluation and treatment d/t impaired balance and history of falls. She is demonstrating decreased LE strength, diminished tandem and SLS balance, and difficulty ambulating in narrow base of support or with eyes closed. She has related fear of falls. She requires skilled PT services at this time to address relevant deficits and improve overall function.     OBJECTIVE IMPAIRMENTS: decreased balance, difficulty walking, and decreased strength.   ACTIVITY LIMITATIONS: carrying, lifting, and  stairs  PARTICIPATION LIMITATIONS: community activity  PERSONAL FACTORS: Age, Fitness, Past/current experiences, and 3+ comorbidities: Relevant PMHx includes anxiety, osteoporosis, BIL plantar fasciitis, supraventricular tachycardia, chronic knee pain, L hammer toe  are also affecting patient's functional outcome.   REHAB POTENTIAL: Fair    CLINICAL DECISION MAKING: Stable/uncomplicated  EVALUATION COMPLEXITY: Low   GOALS: Goals reviewed with patient? YES  SHORT TERM GOALS: Target date: 07/02/2024   Patient will be independent with initial home program at least 3 days/week.  Baseline: provided at eval Goal Status: INITIAL   2.  Patient will demonstrate improved postural awareness for at least 15 minutes while standing without need for cueing from PT.  Baseline: see objective measures Goal Status: INITIAL   3.  Patient will demonstrate improved LE strength to at least 4+/5 MMT BIL Baseline: see objective measures Goal status: INITIAL  4.  Patient will demonstrate ability to maintain Romberg on airex foam pad with eyes closed for 30 seconds.  Baseline: 20 sec Goal status: INITIAL   LONG TERM GOALS: Target date: 07/30/2024   Patient will report improved overall functional ability with LEFS score of 58 or greater.  Baseline: 46 Goal Status: INITIAL    2.  Patient will demonstrate ability to maintain tandem stance for at least 20 seconds.  Baseline: see objective measures Goal status: INITIAL  3.  Patient will demonstrate ability to maintain SLS for at least 15 seconds on each LE.  Baseline: see objective measures Goal status: INITIAL  4.  Patient will self-reported increased PSFS score for balance/fear of falling to 6/10 or greater.  Baseline: 2/10 Goal status: INITIAL  5.  Patient will  increase FGA score to 24 or greater in order to indicate low risk of falls  Baseline: 19 Goal status: INITIAL     PLAN:  PT FREQUENCY: 1-2x/week  PT DURATION: 8  weeks  PLANNED INTERVENTIONS: 97164- PT Re-evaluation, 97750- Physical Performance Testing, 97110-Therapeutic exercises, 97530- Therapeutic activity, 97112- Neuromuscular re-education, 97535- Self Care, [id number]- Manual therapy, [phone]- Aquatic Therapy, Patient/Family education, Balance training, and Stair training  PLAN FOR NEXT SESSION: address static standing balance with narrow BOS and uneven surfaces, standing marches and cone taps to address SLS tolerance; patient education regarding fall risk reduction, gait activities, elliptical    Marko Molt, PT, DPT  06/07/2024 12:24 PM

## 2024-06-04 NOTE — Assessment & Plan Note (Signed)
 Checking calcium and vitamin D  and DEXA will be done soon to assess response on drug holiday from fosamax.

## 2024-06-04 NOTE — Assessment & Plan Note (Signed)
 Referral done to PT to help mobilize her and help with her feeling safe to start exercising to build strength, endurance, and balance.

## 2024-06-07 ENCOUNTER — Ambulatory Visit: Payer: Self-pay | Admitting: Internal Medicine

## 2024-06-09 ENCOUNTER — Ambulatory Visit

## 2024-06-09 DIAGNOSIS — R262 Difficulty in walking, not elsewhere classified: Secondary | ICD-10-CM

## 2024-06-09 DIAGNOSIS — G8929 Other chronic pain: Secondary | ICD-10-CM | POA: Diagnosis not present

## 2024-06-09 DIAGNOSIS — M6281 Muscle weakness (generalized): Secondary | ICD-10-CM

## 2024-06-09 NOTE — Therapy (Signed)
 OUTPATIENT PHYSICAL THERAPY TREATMENT NOTE   Patient Name: Sara [name] MRN: [id number] DOB:[birth year], 73 y.o., female Today's Date: 06/09/2024  END OF SESSION:  PT End of Session - 06/09/24 1348     Visit Number 2    Number of Visits 16    Date for PT Re-Evaluation 07/30/24    Authorization Type HTA    PT Start Time 1400    PT Stop Time 1438    PT Time Calculation (min) 38 min    Activity Tolerance Patient tolerated treatment well    Behavior During Therapy WFL for tasks assessed/performed          Past Medical History:  Diagnosis Date   Anemia    as child   ANXIETY    Cataract    bilateral-removed   Esophageal stricture    GERD (gastroesophageal reflux disease)    Osteoporosis    PLANTAR FASCIITIS, BILATERAL    PONV (postoperative nausea and vomiting)    SUPRAVENTRICULAR TACHYCARDIA    Urge incontinence    Past Surgical History:  Procedure Laterality Date   Adenosine Myoview  03/14/05   Cather ablation  04/2007   EYE SURGERY Bilateral 10-2010   cataracts with lens replacments   HERNIA REPAIR  15 yrs ago   I & D EXTREMITY Left 02/26/2023   Procedure: IRRIGATION AND DEBRIDEMENT LEFT INDEX FINGER;  Surgeon: Lorretta Dess, MD;  Location: [facility] OR;  Service: Plastics;  Laterality: Left;   NECK LIFT  05/2014   plastic  surgery for a neck lift   VAGINAL HYSTERECTOMY  yrs ago   Patient Active Problem List   Diagnosis Date Noted   Chronic knee pain 06/04/2024   Osteoporosis 12/24/2021   Bunion 04/17/2015   Hammer toe of left foot 04/17/2015   Routine general medical examination at a health care facility 01/13/2013   GERD (gastroesophageal reflux disease) 12/04/2012   Urge incontinence 03/01/2009    PCP: Rollene Almarie LABOR, MD   REFERRING PROVIDER: Rollene Almarie LABOR, MD   REFERRING DIAG:  R26.89 (ICD-10-CM) - Balance disorder  [phone] (ICD-10-CM) - Chronic knee pain, unspecified laterality    THERAPY DIAG:  Difficulty in walking, not  elsewhere classified  Muscle weakness (generalized)  Rationale for Evaluation and Treatment: Rehabilitation  ONSET DATE: 06/01/2024 date of referral  SUBJECTIVE:   SUBJECTIVE STATEMENT: Patient reports that she has been performing her HEP and has noticed that her Lt ankle is weaker than her Rt. No falls since last session.   EVALS: Patient reports to PT today d/t challenges with her balance. She had 2 falls in the past year d/t walking in the rain or mud. She reports that she's noticed difficulties with walking in a straight line and is worried about future falls.   balance, strength, endurance   PERTINENT HISTORY: Relevant PMHx includes anxiety, osteoporosis, BIL plantar fasciitis, supraventricular tachycardia, chronic knee pain, L hammer toe   PAIN:  Are you having pain? No   PRECAUTIONS: None  RED FLAGS: None   WEIGHT BEARING RESTRICTIONS: No  FALLS:  Has patient fallen in last 6 months? Yes. Number of falls 1  LIVING ENVIRONMENT: Lives with: lives with their family Lives in: House/apartment Stairs: Yes: Internal: 1 flight steps with railing and External: 5 steps railing Has following equipment at home: shower chair and Grab bars  OCCUPATION: retired; Conservator, museum/gallery   PLOF: Independent  PATIENT GOALS: to improve balance, decrease risk of falls  NEXT MD VISIT: 06/07/2024 with PCP  OBJECTIVE:  Note: Objective measures were completed at Evaluation unless otherwise noted.   PATIENT SURVEYS:   LEFS 46/80  PSFS score: 2 (average)   1. Walk in straight line - 2  2. Balance (worried about falling) - 2  3. Arm Strength - 2    COGNITION: Overall cognitive status: Within functional limits for tasks assessed      POSTURE: rounded shoulders and forward head   LOWER EXTREMITY ROM:  Active ROM Right eval Left eval  Hip flexion    Hip extension    Hip abduction    Hip adduction    Hip internal rotation    Hip external rotation    Knee flexion    Knee  extension    Ankle dorsiflexion    Ankle plantarflexion    Ankle inversion    Ankle eversion     (Blank rows = not tested)  LOWER EXTREMITY MMT:  MMT Right eval Left eval  Hip flexion 4 4  Hip extension    Hip abduction 4 4  Hip adduction 4 4  Hip internal rotation    Hip external rotation    Knee flexion 5 5  Knee extension 5 5  Ankle dorsiflexion    Ankle plantarflexion    Ankle inversion    Ankle eversion     (Blank rows = not tested)   FUNCTIONAL TESTS:   Balance    Romberg EO on floor  30s  Romberg EC on floor 30s  Romberg EO on airex 30s  Romberg EC on airex        20s    Balance Right Left   Tandem on floor 12s 15s  SLS on floor  4s 3 s                                                                                                                              TREATMENT DATE:  [facility] Adult PT Treatment:                                                DATE: 06/09/24 Therapeutic Exercise: Nustep level 5 x 5 mins while gathering subjective and planning session with patient Standing heel/toe raises x10 Standing hip abduction 2x10 BIL Standing hip extension 2x10 BIL Neuromuscular re-ed: Standing marching x30 UE PRN Standing marching on Airex x30 UE SLS cone taps x3 cones x30 BIL FT EO/EC x30 no UE FT EO on airex x30 no UE Tandem x30 BIL no UE Semi tandem on airex x30 BIL No UE Tandem walking at counter fwd x3 laps UE support PRN STS on airex 2x5   [facility] Adult PT Treatment:  DATE: 06/04/2024   Initial evaluation: see patient education and home exercise program as noted below      PATIENT EDUCATION:  Education details: reviewed initial home exercise program; discussion of POC, prognosis and goals for skilled PT   Person educated: Patient Education method: Explanation, Demonstration, and Handouts Education comprehension: verbalized understanding, returned demonstration, and needs further  education  HOME EXERCISE PROGRAM: Access Code: [email or web address] URL: [email or web address] Date: 07/111/2025 Prepared by: Marko Molt  Exercises - Standing Hip Abduction with Counter Support  - 1 x daily - 3 x weekly - 2 sets - 10 reps - Standing Hip Extension with Counter Support  - 1 x daily - 3 x weekly - 2 sets - 10 reps - 3 sec hold - Standing March with Counter Support  - 1 x daily - 3 x weekly - 1 sets - 15 reps - 3 sec hold - Tandem Stance in Corner  - 1 x daily - 3 x weekly - 3 sets  ASSESSMENT:  CLINICAL IMPRESSION: Patient presents to first follow up PT session reporting no new falls and that she has been completing her HEP with no issues. Session today focused on improving overall activity tolerance, LE strengthening, and balance tasks to improve proprioception. She requires UE support PRN with most balance activities. Patient was able to tolerate all prescribed exercises with no adverse effects. Patient continues to benefit from skilled PT services and should be progressed as able to improve functional independence.   EVAL: Aylen is a 73 y.o. female who was seen today for physical therapy evaluation and treatment d/t impaired balance and history of falls. She is demonstrating decreased LE strength, diminished tandem and SLS balance, and difficulty ambulating in narrow base of support or with eyes closed. She has related fear of falls. She requires skilled PT services at this time to address relevant deficits and improve overall function.     OBJECTIVE IMPAIRMENTS: decreased balance, difficulty walking, and decreased strength.   ACTIVITY LIMITATIONS: carrying, lifting, and stairs  PARTICIPATION LIMITATIONS: community activity  PERSONAL FACTORS: Age, Fitness, Past/current experiences, and 3+ comorbidities: Relevant PMHx includes anxiety, osteoporosis, BIL plantar fasciitis, supraventricular tachycardia, chronic knee pain, L hammer toe  are also affecting patient's  functional outcome.   REHAB POTENTIAL: Fair    CLINICAL DECISION MAKING: Stable/uncomplicated  EVALUATION COMPLEXITY: Low   GOALS: Goals reviewed with patient? YES  SHORT TERM GOALS: Target date: 07/02/2024   Patient will be independent with initial home program at least 3 days/week.  Baseline: provided at eval Goal Status: INITIAL   2.  Patient will demonstrate improved postural awareness for at least 15 minutes while standing without need for cueing from PT.  Baseline: see objective measures Goal Status: INITIAL   3.  Patient will demonstrate improved LE strength to at least 4+/5 MMT BIL Baseline: see objective measures Goal status: INITIAL  4.  Patient will demonstrate ability to maintain Romberg on airex foam pad with eyes closed for 30 seconds.  Baseline: 20 sec Goal status: INITIAL   LONG TERM GOALS: Target date: 07/30/2024   Patient will report improved overall functional ability with LEFS score of 58 or greater.  Baseline: 46 Goal Status: INITIAL    2.  Patient will demonstrate ability to maintain tandem stance for at least 20 seconds.  Baseline: see objective measures Goal status: INITIAL  3.  Patient will demonstrate ability to maintain SLS for at least 15 seconds on each LE.  Baseline: see objective measures Goal status: INITIAL  4.  Patient will self-reported increased PSFS score for balance/fear of falling to 6/10 or greater.  Baseline: 2/10 Goal status: INITIAL  5.  Patient will increase FGA score to 24 or greater in order to indicate low risk of falls  Baseline: 19 Goal status: INITIAL     PLAN:  PT FREQUENCY: 1-2x/week  PT DURATION: 8 weeks  PLANNED INTERVENTIONS: 97164- PT Re-evaluation, 97750- Physical Performance Testing, 97110-Therapeutic exercises, 97530- Therapeutic activity, 97112- Neuromuscular re-education, 97535- Self Care, 02859- Manual therapy, [phone]- Aquatic Therapy, Patient/Family education, Balance training, and Stair  training  PLAN FOR NEXT SESSION: address static standing balance with narrow BOS and uneven surfaces, standing marches and cone taps to address SLS tolerance; patient education regarding fall risk reduction, gait activities, elliptical    Corean Pouch PTA  06/09/2024 2:39 PM

## 2024-06-16 ENCOUNTER — Ambulatory Visit

## 2024-06-16 NOTE — Therapy (Signed)
 Created in error

## 2024-06-16 NOTE — Progress Notes (Signed)
 This encounter was created in error - please disregard.  This encounter was created in error - please disregard.

## 2024-06-22 ENCOUNTER — Ambulatory Visit

## 2024-06-22 ENCOUNTER — Telehealth: Payer: Self-pay

## 2024-06-22 NOTE — Telephone Encounter (Signed)
 Spoke to patient regarding missed appointment. She had her day written down incorrectly, rescheduled to tomorrow.  Corean Pouch, PTA 06/22/24 1:40 PM

## 2024-06-22 NOTE — Therapy (Incomplete)
 OUTPATIENT PHYSICAL THERAPY TREATMENT NOTE   Patient Name: Sara [name] MRN: [id number] DOB:11/28/1950, 73 y.o., female Today's Date: 06/22/2024  END OF SESSION:    Past Medical History:  Diagnosis Date   Anemia    as child   ANXIETY    Cataract    bilateral-removed   Esophageal stricture    GERD (gastroesophageal reflux disease)    Osteoporosis    PLANTAR FASCIITIS, BILATERAL    PONV (postoperative nausea and vomiting)    SUPRAVENTRICULAR TACHYCARDIA    Urge incontinence    Past Surgical History:  Procedure Laterality Date   Adenosine Myoview  03/14/05   Cather ablation  04/2007   EYE SURGERY Bilateral 10-2010   cataracts with lens replacments   HERNIA REPAIR  15 yrs ago   I & D EXTREMITY Left 02/26/2023   Procedure: IRRIGATION AND DEBRIDEMENT LEFT INDEX FINGER;  Surgeon: Lorretta Dess, MD;  Location: [facility] OR;  Service: Plastics;  Laterality: Left;   NECK LIFT  05/2014   plastic  surgery for a neck lift   VAGINAL HYSTERECTOMY  yrs ago   Patient Active Problem List   Diagnosis Date Noted   Chronic knee pain 06/04/2024   Osteoporosis 12/24/2021   Bunion 04/17/2015   Hammer toe of left foot 04/17/2015   Routine general medical examination at a health care facility 01/13/2013   GERD (gastroesophageal reflux disease) 12/04/2012   Urge incontinence 03/01/2009    PCP: Rollene Almarie LABOR, MD   REFERRING PROVIDER: Rollene Almarie LABOR, MD   REFERRING DIAG:  R26.89 (ICD-10-CM) - Balance disorder  [phone] (ICD-10-CM) - Chronic knee pain, unspecified laterality    THERAPY DIAG:  No diagnosis found.  Rationale for Evaluation and Treatment: Rehabilitation  ONSET DATE: 06/01/2024 date of referral  SUBJECTIVE:   SUBJECTIVE STATEMENT: Patient reports that she has been performing her HEP and has noticed that her Lt ankle is weaker than her Rt. No falls since last session.   EVALS: Patient reports to PT today d/t challenges with her balance. She had 2 falls  in the past year d/t walking in the rain or mud. She reports that she's noticed difficulties with walking in a straight line and is worried about future falls.   balance, strength, endurance   PERTINENT HISTORY: Relevant PMHx includes anxiety, osteoporosis, BIL plantar fasciitis, supraventricular tachycardia, chronic knee pain, L hammer toe   PAIN:  Are you having pain? No   PRECAUTIONS: None  RED FLAGS: None   WEIGHT BEARING RESTRICTIONS: No  FALLS:  Has patient fallen in last 6 months? Yes. Number of falls 1  LIVING ENVIRONMENT: Lives with: lives with their family Lives in: House/apartment Stairs: Yes: Internal: 1 flight steps with railing and External: 5 steps railing Has following equipment at home: shower chair and Grab bars  OCCUPATION: retired; Conservator, museum/gallery   PLOF: Independent  PATIENT GOALS: to improve balance, decrease risk of falls  NEXT MD VISIT: 06/07/2024 with PCP  OBJECTIVE:  Note: Objective measures were completed at Evaluation unless otherwise noted.   PATIENT SURVEYS:   LEFS 46/80  PSFS score: 2 (average)   1. Walk in straight line - 2  2. Balance (worried about falling) - 2  3. Arm Strength - 2    COGNITION: Overall cognitive status: Within functional limits for tasks assessed      POSTURE: rounded shoulders and forward head   LOWER EXTREMITY ROM:  Active ROM Right eval Left eval  Hip flexion    Hip extension  Hip abduction    Hip adduction    Hip internal rotation    Hip external rotation    Knee flexion    Knee extension    Ankle dorsiflexion    Ankle plantarflexion    Ankle inversion    Ankle eversion     (Blank rows = not tested)  LOWER EXTREMITY MMT:  MMT Right eval Left eval  Hip flexion 4 4  Hip extension    Hip abduction 4 4  Hip adduction 4 4  Hip internal rotation    Hip external rotation    Knee flexion 5 5  Knee extension 5 5  Ankle dorsiflexion    Ankle plantarflexion    Ankle inversion    Ankle  eversion     (Blank rows = not tested)   FUNCTIONAL TESTS:   Balance    Romberg EO on floor  30s  Romberg EC on floor 30s  Romberg EO on airex 30s  Romberg EC on airex        20s    Balance Right Left   Tandem on floor 12s 15s  SLS on floor  4s 3 s                                                                                                                              TREATMENT DATE:  [facility] Adult PT Treatment:                                                DATE: 06/22/24 Therapeutic Exercise: Nustep level 5 x 5 mins while gathering subjective and planning session with patient Standing heel/toe raises x10 Standing hip abduction 2x10 BIL Standing hip extension 2x10 BIL Neuromuscular re-ed: Standing marching x30 UE PRN Standing marching on Airex x30 UE SLS cone taps x3 cones x30 BIL FT EO/EC x30 no UE FT EO on airex x30 no UE Tandem x30 BIL no UE Semi tandem on airex x30 BIL No UE Tandem walking at counter fwd x3 laps UE support PRN STS on airex 2x5  [facility] Adult PT Treatment:                                                DATE: 06/09/24 Therapeutic Exercise: Nustep level 5 x 5 mins while gathering subjective and planning session with patient Standing heel/toe raises x10 Standing hip abduction 2x10 BIL Standing hip extension 2x10 BIL Neuromuscular re-ed: Standing marching x30 UE PRN Standing marching on Airex x30 UE SLS cone taps x3 cones x30 BIL FT EO/EC x30 no UE FT EO on airex x30 no UE Tandem x30 BIL no UE Semi tandem on airex x30 BIL No UE Tandem  walking at counter fwd x3 laps UE support PRN STS on airex 2x5   [facility] Adult PT Treatment:                                                DATE: 06/04/2024   Initial evaluation: see patient education and home exercise program as noted below      PATIENT EDUCATION:  Education details: reviewed initial home exercise program; discussion of POC, prognosis and goals for skilled PT   Person educated:  Patient Education method: Explanation, Demonstration, and Handouts Education comprehension: verbalized understanding, returned demonstration, and needs further education  HOME EXERCISE PROGRAM: Access Code: [email or web address] URL: [email or web address] Date: 07/111/2025 Prepared by: Marko Molt  Exercises - Standing Hip Abduction with Counter Support  - 1 x daily - 3 x weekly - 2 sets - 10 reps - Standing Hip Extension with Counter Support  - 1 x daily - 3 x weekly - 2 sets - 10 reps - 3 sec hold - Standing March with Counter Support  - 1 x daily - 3 x weekly - 1 sets - 15 reps - 3 sec hold - Tandem Stance in Corner  - 1 x daily - 3 x weekly - 3 sets  ASSESSMENT:  CLINICAL IMPRESSION: ***  Patient presents to first follow up PT session reporting no new falls and that she has been completing her HEP with no issues. Session today focused on improving overall activity tolerance, LE strengthening, and balance tasks to improve proprioception. She requires UE support PRN with most balance activities. Patient was able to tolerate all prescribed exercises with no adverse effects. Patient continues to benefit from skilled PT services and should be progressed as able to improve functional independence.   EVAL: Sara [name] is a 73 y.o. female who was seen today for physical therapy evaluation and treatment d/t impaired balance and history of falls. She is demonstrating decreased LE strength, diminished tandem and SLS balance, and difficulty ambulating in narrow base of support or with eyes closed. She has related fear of falls. She requires skilled PT services at this time to address relevant deficits and improve overall function.     OBJECTIVE IMPAIRMENTS: decreased balance, difficulty walking, and decreased strength.   ACTIVITY LIMITATIONS: carrying, lifting, and stairs  PARTICIPATION LIMITATIONS: community activity  PERSONAL FACTORS: Age, Fitness, Past/current experiences, and 3+  comorbidities: Relevant PMHx includes anxiety, osteoporosis, BIL plantar fasciitis, supraventricular tachycardia, chronic knee pain, L hammer toe  are also affecting patient's functional outcome.   REHAB POTENTIAL: Fair    CLINICAL DECISION MAKING: Stable/uncomplicated  EVALUATION COMPLEXITY: Low   GOALS: Goals reviewed with patient? YES  SHORT TERM GOALS: Target date: 07/02/2024   Patient will be independent with initial home program at least 3 days/week.  Baseline: provided at eval Goal Status: INITIAL   2.  Patient will demonstrate improved postural awareness for at least 15 minutes while standing without need for cueing from PT.  Baseline: see objective measures Goal Status: INITIAL   3.  Patient will demonstrate improved LE strength to at least 4+/5 MMT BIL Baseline: see objective measures Goal status: INITIAL  4.  Patient will demonstrate ability to maintain Romberg on airex foam pad with eyes closed for 30 seconds.  Baseline: 20 sec Goal status: INITIAL   LONG TERM GOALS: Target date: 07/30/2024   Patient will report improved overall  functional ability with LEFS score of 58 or greater.  Baseline: 46 Goal Status: INITIAL    2.  Patient will demonstrate ability to maintain tandem stance for at least 20 seconds.  Baseline: see objective measures Goal status: INITIAL  3.  Patient will demonstrate ability to maintain SLS for at least 15 seconds on each LE.  Baseline: see objective measures Goal status: INITIAL  4.  Patient will self-reported increased PSFS score for balance/fear of falling to 6/10 or greater.  Baseline: 2/10 Goal status: INITIAL  5.  Patient will increase FGA score to 24 or greater in order to indicate low risk of falls  Baseline: 19 Goal status: INITIAL     PLAN:  PT FREQUENCY: 1-2x/week  PT DURATION: 8 weeks  PLANNED INTERVENTIONS: 97164- PT Re-evaluation, 97750- Physical Performance Testing, 97110-Therapeutic exercises, 97530-  Therapeutic activity, 97112- Neuromuscular re-education, 97535- Self Care, [id number]- Manual therapy, [phone]- Aquatic Therapy, Patient/Family education, Balance training, and Stair training  PLAN FOR NEXT SESSION: address static standing balance with narrow BOS and uneven surfaces, standing marches and cone taps to address SLS tolerance; patient education regarding fall risk reduction, gait activities, elliptical    Corean Pouch PTA  06/22/2024 8:14 AM

## 2024-06-23 ENCOUNTER — Ambulatory Visit

## 2024-06-23 DIAGNOSIS — G8929 Other chronic pain: Secondary | ICD-10-CM | POA: Diagnosis not present

## 2024-06-23 DIAGNOSIS — M6281 Muscle weakness (generalized): Secondary | ICD-10-CM

## 2024-06-23 DIAGNOSIS — R262 Difficulty in walking, not elsewhere classified: Secondary | ICD-10-CM

## 2024-06-23 NOTE — Therapy (Signed)
 OUTPATIENT PHYSICAL THERAPY TREATMENT NOTE   Patient Name: Sara [name] MRN: [id number] DOB:19-Aug-1951, 73 y.o., female Today's Date: 06/23/2024  END OF SESSION:  PT End of Session - 06/23/24 0957     Visit Number 3    Number of Visits 16    Date for PT Re-Evaluation 07/30/24    Authorization Type HTA    PT Start Time 1000    PT Stop Time 1038    PT Time Calculation (min) 38 min    Activity Tolerance Patient tolerated treatment well    Behavior During Therapy WFL for tasks assessed/performed           Past Medical History:  Diagnosis Date   Anemia    as child   ANXIETY    Cataract    bilateral-removed   Esophageal stricture    GERD (gastroesophageal reflux disease)    Osteoporosis    PLANTAR FASCIITIS, BILATERAL    PONV (postoperative nausea and vomiting)    SUPRAVENTRICULAR TACHYCARDIA    Urge incontinence    Past Surgical History:  Procedure Laterality Date   Adenosine Myoview  03/14/05   Cather ablation  04/2007   EYE SURGERY Bilateral 10-2010   cataracts with lens replacments   HERNIA REPAIR  15 yrs ago   I & D EXTREMITY Left 02/26/2023   Procedure: IRRIGATION AND DEBRIDEMENT LEFT INDEX FINGER;  Surgeon: Lorretta Dess, MD;  Location: [facility] OR;  Service: Plastics;  Laterality: Left;   NECK LIFT  05/2014   plastic  surgery for a neck lift   VAGINAL HYSTERECTOMY  yrs ago   Patient Active Problem List   Diagnosis Date Noted   Chronic knee pain 06/04/2024   Osteoporosis 12/24/2021   Bunion 04/17/2015   Hammer toe of left foot 04/17/2015   Routine general medical examination at a health care facility 01/13/2013   GERD (gastroesophageal reflux disease) 12/04/2012   Urge incontinence 03/01/2009    PCP: Rollene Almarie LABOR, MD   REFERRING PROVIDER: Rollene Almarie LABOR, MD   REFERRING DIAG:  R26.89 (ICD-10-CM) - Balance disorder  [phone] (ICD-10-CM) - Chronic knee pain, unspecified laterality    THERAPY DIAG:  Difficulty in walking, not  elsewhere classified  Muscle weakness (generalized)  Rationale for Evaluation and Treatment: Rehabilitation  ONSET DATE: 06/01/2024 date of referral  SUBJECTIVE:   SUBJECTIVE STATEMENT: No new falls since last session, has been compliant with HEP.  EVALS: Patient reports to PT today d/t challenges with her balance. She had 2 falls in the past year d/t walking in the rain or mud. She reports that she's noticed difficulties with walking in a straight line and is worried about future falls.   balance, strength, endurance   PERTINENT HISTORY: Relevant PMHx includes anxiety, osteoporosis, BIL plantar fasciitis, supraventricular tachycardia, chronic knee pain, L hammer toe   PAIN:  Are you having pain? No   PRECAUTIONS: None  RED FLAGS: None   WEIGHT BEARING RESTRICTIONS: No  FALLS:  Has patient fallen in last 6 months? Yes. Number of falls 1  LIVING ENVIRONMENT: Lives with: lives with their family Lives in: House/apartment Stairs: Yes: Internal: 1 flight steps with railing and External: 5 steps railing Has following equipment at home: shower chair and Grab bars  OCCUPATION: retired; Conservator, museum/gallery   PLOF: Independent  PATIENT GOALS: to improve balance, decrease risk of falls  NEXT MD VISIT: 06/07/2024 with PCP  OBJECTIVE:  Note: Objective measures were completed at Evaluation unless otherwise noted.   PATIENT SURVEYS:  LEFS 46/80  PSFS score: 2 (average)   1. Walk in straight line - 2  2. Balance (worried about falling) - 2  3. Arm Strength - 2    COGNITION: Overall cognitive status: Within functional limits for tasks assessed      POSTURE: rounded shoulders and forward head   LOWER EXTREMITY ROM:  Active ROM Right eval Left eval  Hip flexion    Hip extension    Hip abduction    Hip adduction    Hip internal rotation    Hip external rotation    Knee flexion    Knee extension    Ankle dorsiflexion    Ankle plantarflexion    Ankle inversion     Ankle eversion     (Blank rows = not tested)  LOWER EXTREMITY MMT:  MMT Right eval Left eval  Hip flexion 4 4  Hip extension    Hip abduction 4 4  Hip adduction 4 4  Hip internal rotation    Hip external rotation    Knee flexion 5 5  Knee extension 5 5  Ankle dorsiflexion    Ankle plantarflexion    Ankle inversion    Ankle eversion     (Blank rows = not tested)   FUNCTIONAL TESTS:   Balance    Romberg EO on floor  30s  Romberg EC on floor 30s  Romberg EO on airex 30s  Romberg EC on airex        20s    Balance Right Left   Tandem on floor 12s 15s  SLS on floor  4s 3 s                                                                                                                              TREATMENT DATE:  [facility] Adult PT Treatment:                                                DATE: 06/22/24 Therapeutic Exercise: Nustep level 5 x 8 mins while gathering subjective and planning session with patient Standing heel/toe raises 2x10 Standing hip abduction 2x10 BIL Standing hip extension 2x10 BIL Neuromuscular re-ed: Standing marching 2x30 single UE PRN Standing marching on Airex 2x30 single UE support SLS cone taps x3 cones 2x30 BIL with single UE support moving to fingertip support FT EO on airex x30 no UE with head turns FT EC on airex x30 Semi tandem on airex x30 BIL no UE Tandem walking at counter fwd x3 laps UE support PRN Hurdle step over x4 hurdles fwd/lat step to and step over step pattern STS on airex 2x5  [facility] Adult PT Treatment:  DATE: 06/09/24 Therapeutic Exercise: Nustep level 5 x 5 mins while gathering subjective and planning session with patient Standing heel/toe raises x10 Standing hip abduction 2x10 BIL Standing hip extension 2x10 BIL Neuromuscular re-ed: Standing marching x30 UE PRN Standing marching on Airex x30 UE SLS cone taps x3 cones x30 BIL FT EO/EC x30 no UE FT EO on airex  x30 no UE Tandem x30 BIL no UE Semi tandem on airex x30 BIL No UE Tandem walking at counter fwd x3 laps UE support PRN STS on airex 2x5   [facility] Adult PT Treatment:                                                DATE: 06/04/2024   Initial evaluation: see patient education and home exercise program as noted below      PATIENT EDUCATION:  Education details: reviewed initial home exercise program; discussion of POC, prognosis and goals for skilled PT   Person educated: Patient Education method: Explanation, Demonstration, and Handouts Education comprehension: verbalized understanding, returned demonstration, and needs further education  HOME EXERCISE PROGRAM: Access Code: [email or web address] URL: [email or web address] Date: 07/111/2025 Prepared by: Marko Molt  Exercises - Standing Hip Abduction with Counter Support  - 1 x daily - 3 x weekly - 2 sets - 10 reps - Standing Hip Extension with Counter Support  - 1 x daily - 3 x weekly - 2 sets - 10 reps - 3 sec hold - Standing March with Counter Support  - 1 x daily - 3 x weekly - 1 sets - 15 reps - 3 sec hold - Tandem Stance in Corner  - 1 x daily - 3 x weekly - 3 sets  ASSESSMENT:  CLINICAL IMPRESSION: Patient presents to PT reporting no falls since last session, has been compliant with HEP. Session today focused on LE strengthening and balance tasks to improve proprioception and decrease fall risk. She had the most difficulty with tandem walking, plan to incorporate obstacle course and dual tasking in future sessions. Patient was able to tolerate all prescribed exercises with no adverse effects. Patient continues to benefit from skilled PT services and should be progressed as able to improve functional independence.   EVAL: Carleigh is a 73 y.o. female who was seen today for physical therapy evaluation and treatment d/t impaired balance and history of falls. She is demonstrating decreased LE strength, diminished tandem and SLS  balance, and difficulty ambulating in narrow base of support or with eyes closed. She has related fear of falls. She requires skilled PT services at this time to address relevant deficits and improve overall function.     OBJECTIVE IMPAIRMENTS: decreased balance, difficulty walking, and decreased strength.   ACTIVITY LIMITATIONS: carrying, lifting, and stairs  PARTICIPATION LIMITATIONS: community activity  PERSONAL FACTORS: Age, Fitness, Past/current experiences, and 3+ comorbidities: Relevant PMHx includes anxiety, osteoporosis, BIL plantar fasciitis, supraventricular tachycardia, chronic knee pain, L hammer toe  are also affecting patient's functional outcome.   REHAB POTENTIAL: Fair    CLINICAL DECISION MAKING: Stable/uncomplicated  EVALUATION COMPLEXITY: Low   GOALS: Goals reviewed with patient? YES  SHORT TERM GOALS: Target date: 07/02/2024   Patient will be independent with initial home program at least 3 days/week.  Baseline: provided at eval Goal Status: INITIAL   2.  Patient will demonstrate improved postural awareness for at least 15 minutes while standing without  need for cueing from PT.  Baseline: see objective measures Goal Status: INITIAL   3.  Patient will demonstrate improved LE strength to at least 4+/5 MMT BIL Baseline: see objective measures Goal status: INITIAL  4.  Patient will demonstrate ability to maintain Romberg on airex foam pad with eyes closed for 30 seconds.  Baseline: 20 sec Goal status: INITIAL   LONG TERM GOALS: Target date: 07/30/2024   Patient will report improved overall functional ability with LEFS score of 58 or greater.  Baseline: 46 Goal Status: INITIAL    2.  Patient will demonstrate ability to maintain tandem stance for at least 20 seconds.  Baseline: see objective measures Goal status: INITIAL  3.  Patient will demonstrate ability to maintain SLS for at least 15 seconds on each LE.  Baseline: see objective measures Goal  status: INITIAL  4.  Patient will self-reported increased PSFS score for balance/fear of falling to 6/10 or greater.  Baseline: 2/10 Goal status: INITIAL  5.  Patient will increase FGA score to 24 or greater in order to indicate low risk of falls  Baseline: 19 Goal status: INITIAL     PLAN:  PT FREQUENCY: 1-2x/week  PT DURATION: 8 weeks  PLANNED INTERVENTIONS: 97164- PT Re-evaluation, 97750- Physical Performance Testing, 97110-Therapeutic exercises, 97530- Therapeutic activity, 97112- Neuromuscular re-education, 97535- Self Care, 02859- Manual therapy, [phone]- Aquatic Therapy, Patient/Family education, Balance training, and Stair training  PLAN FOR NEXT SESSION: address static standing balance with narrow BOS and uneven surfaces, standing marches and cone taps to address SLS tolerance; patient education regarding fall risk reduction, gait activities, elliptical    Corean Pouch PTA  06/23/2024 10:40 AM

## 2024-07-02 ENCOUNTER — Ambulatory Visit: Payer: Self-pay | Attending: Internal Medicine

## 2024-07-02 DIAGNOSIS — M6281 Muscle weakness (generalized): Secondary | ICD-10-CM | POA: Diagnosis not present

## 2024-07-02 DIAGNOSIS — R262 Difficulty in walking, not elsewhere classified: Secondary | ICD-10-CM | POA: Diagnosis not present

## 2024-07-02 NOTE — Therapy (Signed)
 OUTPATIENT PHYSICAL THERAPY TREATMENT NOTE   Patient Name: Sara [name] MRN: [id number] DOB:1951-03-09, 73 y.o., female Today's Date: 07/02/2024  END OF SESSION:  PT End of Session - 07/02/24 1125     Visit Number 4    Number of Visits 16    Date for PT Re-Evaluation 07/30/24    Authorization Type HTA    PT Start Time 1130    PT Stop Time 1210    PT Time Calculation (min) 40 min    Activity Tolerance Patient tolerated treatment well    Behavior During Therapy WFL for tasks assessed/performed          Past Medical History:  Diagnosis Date   Anemia    as child   ANXIETY    Cataract    bilateral-removed   Esophageal stricture    GERD (gastroesophageal reflux disease)    Osteoporosis    PLANTAR FASCIITIS, BILATERAL    PONV (postoperative nausea and vomiting)    SUPRAVENTRICULAR TACHYCARDIA    Urge incontinence    Past Surgical History:  Procedure Laterality Date   Adenosine Myoview  03/14/05   Cather ablation  04/2007   EYE SURGERY Bilateral 10-2010   cataracts with lens replacments   HERNIA REPAIR  15 yrs ago   I & D EXTREMITY Left 02/26/2023   Procedure: IRRIGATION AND DEBRIDEMENT LEFT INDEX FINGER;  Surgeon: Lorretta Dess, MD;  Location: [facility] OR;  Service: Plastics;  Laterality: Left;   NECK LIFT  05/2014   plastic  surgery for a neck lift   VAGINAL HYSTERECTOMY  yrs ago   Patient Active Problem List   Diagnosis Date Noted   Chronic knee pain 06/04/2024   Osteoporosis 12/24/2021   Bunion 04/17/2015   Hammer toe of left foot 04/17/2015   Routine general medical examination at a health care facility 01/13/2013   GERD (gastroesophageal reflux disease) 12/04/2012   Urge incontinence 03/01/2009    PCP: Rollene Almarie LABOR, MD   REFERRING PROVIDER: Rollene Almarie LABOR, MD   REFERRING DIAG:  R26.89 (ICD-10-CM) - Balance disorder  [id number] (ICD-10-CM) - Chronic knee pain, unspecified laterality    THERAPY DIAG:  Difficulty in walking, not  elsewhere classified  Muscle weakness (generalized)  Rationale for Evaluation and Treatment: Rehabilitation  ONSET DATE: 06/01/2024 date of referral  SUBJECTIVE:   SUBJECTIVE STATEMENT: Patient reports that she is doing well.  EVALS: Patient reports to PT today d/t challenges with her balance. She had 2 falls in the past year d/t walking in the rain or mud. She reports that she's noticed difficulties with walking in a straight line and is worried about future falls.   balance, strength, endurance   PERTINENT HISTORY: Relevant PMHx includes anxiety, osteoporosis, BIL plantar fasciitis, supraventricular tachycardia, chronic knee pain, L hammer toe   PAIN:  Are you having pain? No   PRECAUTIONS: None  RED FLAGS: None   WEIGHT BEARING RESTRICTIONS: No  FALLS:  Has patient fallen in last 6 months? Yes. Number of falls 1  LIVING ENVIRONMENT: Lives with: lives with their family Lives in: House/apartment Stairs: Yes: Internal: 1 flight steps with railing and External: 5 steps railing Has following equipment at home: shower chair and Grab bars  OCCUPATION: retired; Conservator, museum/gallery   PLOF: Independent  PATIENT GOALS: to improve balance, decrease risk of falls  NEXT MD VISIT: 06/07/2024 with PCP  OBJECTIVE:  Note: Objective measures were completed at Evaluation unless otherwise noted.   PATIENT SURVEYS:   LEFS 46/80  PSFS  score: 2 (average)   1. Walk in straight line - 2  2. Balance (worried about falling) - 2  3. Arm Strength - 2    COGNITION: Overall cognitive status: Within functional limits for tasks assessed      POSTURE: rounded shoulders and forward head   LOWER EXTREMITY ROM:  Active ROM Right eval Left eval  Hip flexion    Hip extension    Hip abduction    Hip adduction    Hip internal rotation    Hip external rotation    Knee flexion    Knee extension    Ankle dorsiflexion    Ankle plantarflexion    Ankle inversion    Ankle eversion      (Blank rows = not tested)  LOWER EXTREMITY MMT:  MMT Right eval Left eval  Hip flexion 4 4  Hip extension    Hip abduction 4 4  Hip adduction 4 4  Hip internal rotation    Hip external rotation    Knee flexion 5 5  Knee extension 5 5  Ankle dorsiflexion    Ankle plantarflexion    Ankle inversion    Ankle eversion     (Blank rows = not tested)   FUNCTIONAL TESTS:   Balance    Romberg EO on floor  30s  Romberg EC on floor 30s  Romberg EO on airex 30s  Romberg EC on airex        20s    Balance Right Left   Tandem on floor 12s 15s  SLS on floor  4s 3 s                                                                                                                              TREATMENT DATE:  [facility] Adult PT Treatment:                                                DATE: 07/02/24 Therapeutic Exercise: Nustep level 6 x 8 mins while gathering subjective and planning session with patient Standing heel/toe raises 2x10 Standing hip abduction x10 BIL Standing hip extension x10 BIL Neuromuscular re-ed: FT EO on airex x30 no UE FT EC on airex x30 Tandem walking at counter fwd x3 laps UE support PRN Hurdle step over x4 hurdles fwd/lat step to and step over step pattern with dual task of counting down from 100 by twos Hurdles (4) with airex in middle, fwd step overs - cues to decrease circumduction and speed STS on airex 2x5 - cues to decrease valgus collapse  [facility] Adult PT Treatment:  DATE: 06/22/24 Therapeutic Exercise: Nustep level 5 x 8 mins while gathering subjective and planning session with patient Standing heel/toe raises 2x10 Standing hip abduction 2x10 BIL Standing hip extension 2x10 BIL Neuromuscular re-ed: Standing marching 2x30 single UE PRN Standing marching on Airex 2x30 single UE support SLS cone taps x3 cones 2x30 BIL with single UE support moving to fingertip support FT EO on airex x30 no UE with head  turns FT EC on airex x30 Semi tandem on airex x30 BIL no UE Tandem walking at counter fwd x3 laps UE support PRN Hurdle step over x4 hurdles fwd/lat step to and step over step pattern STS on airex 2x5  [facility] Adult PT Treatment:                                                DATE: 06/09/24 Therapeutic Exercise: Nustep level 5 x 5 mins while gathering subjective and planning session with patient Standing heel/toe raises x10 Standing hip abduction 2x10 BIL Standing hip extension 2x10 BIL Neuromuscular re-ed: Standing marching x30 UE PRN Standing marching on Airex x30 UE SLS cone taps x3 cones x30 BIL FT EO/EC x30 no UE FT EO on airex x30 no UE Tandem x30 BIL no UE Semi tandem on airex x30 BIL No UE Tandem walking at counter fwd x3 laps UE support PRN STS on airex 2x5   PATIENT EDUCATION:  Education details: reviewed initial home exercise program; discussion of POC, prognosis and goals for skilled PT   Person educated: Patient Education method: Explanation, Demonstration, and Handouts Education comprehension: verbalized understanding, returned demonstration, and needs further education  HOME EXERCISE PROGRAM: Access Code: [email or web address] URL: [email or web address] Date: 07/111/2025 Prepared by: Marko Molt  Exercises - Standing Hip Abduction with Counter Support  - 1 x daily - 3 x weekly - 2 sets - 10 reps - Standing Hip Extension with Counter Support  - 1 x daily - 3 x weekly - 2 sets - 10 reps - 3 sec hold - Standing March with Counter Support  - 1 x daily - 3 x weekly - 1 sets - 15 reps - 3 sec hold - Tandem Stance in Corner  - 1 x daily - 3 x weekly - 3 sets  ASSESSMENT:  CLINICAL IMPRESSION: Patient presents to PT reporting continued compliance with HEP, nothing new to report this session. Session today continued to focus on LE strengthening, balance tasks, and improving standing activity tolerance. Patient was able to tolerate all prescribed exercises  with no adverse effects. Patient continues to benefit from skilled PT services and should be progressed as able to improve functional independence.   EVAL: Aston is a 73 y.o. female who was seen today for physical therapy evaluation and treatment d/t impaired balance and history of falls. She is demonstrating decreased LE strength, diminished tandem and SLS balance, and difficulty ambulating in narrow base of support or with eyes closed. She has related fear of falls. She requires skilled PT services at this time to address relevant deficits and improve overall function.     OBJECTIVE IMPAIRMENTS: decreased balance, difficulty walking, and decreased strength.   ACTIVITY LIMITATIONS: carrying, lifting, and stairs  PARTICIPATION LIMITATIONS: community activity  PERSONAL FACTORS: Age, Fitness, Past/current experiences, and 3+ comorbidities: Relevant PMHx includes anxiety, osteoporosis, BIL plantar fasciitis, supraventricular tachycardia, chronic knee pain, L hammer toe  are also affecting patient's  functional outcome.   REHAB POTENTIAL: Fair    CLINICAL DECISION MAKING: Stable/uncomplicated  EVALUATION COMPLEXITY: Low   GOALS: Goals reviewed with patient? YES  SHORT TERM GOALS: Target date: 07/02/2024   Patient will be independent with initial home program at least 3 days/week.  Baseline: provided at eval Goal Status: MET Pt reports adherence 07/02/24   2.  Patient will demonstrate improved postural awareness for at least 15 minutes while standing without need for cueing from PT.  Baseline: see objective measures Goal Status: MET   3.  Patient will demonstrate improved LE strength to at least 4+/5 MMT BIL Baseline: see objective measures Goal status: Ongoing  4.  Patient will demonstrate ability to maintain Romberg on airex foam pad with eyes closed for 30 seconds.  Baseline: 20 sec Goal status: MET 07/02/24: Pt able to maintain balance for 30 seconds   LONG TERM GOALS: Target  date: 07/30/2024   Patient will report improved overall functional ability with LEFS score of 58 or greater.  Baseline: 46 Goal Status: INITIAL    2.  Patient will demonstrate ability to maintain tandem stance for at least 20 seconds.  Baseline: see objective measures Goal status: INITIAL  3.  Patient will demonstrate ability to maintain SLS for at least 15 seconds on each LE.  Baseline: see objective measures Goal status: INITIAL  4.  Patient will self-reported increased PSFS score for balance/fear of falling to 6/10 or greater.  Baseline: 2/10 Goal status: INITIAL  5.  Patient will increase FGA score to 24 or greater in order to indicate low risk of falls  Baseline: 19 Goal status: INITIAL    PLAN:  PT FREQUENCY: 1-2x/week  PT DURATION: 8 weeks  PLANNED INTERVENTIONS: 97164- PT Re-evaluation, 97750- Physical Performance Testing, 97110-Therapeutic exercises, 97530- Therapeutic activity, 97112- Neuromuscular re-education, 97535- Self Care, [id number]- Manual therapy, [phone]- Aquatic Therapy, Patient/Family education, Balance training, and Stair training  PLAN FOR NEXT SESSION: address static standing balance with narrow BOS and uneven surfaces, standing marches and cone taps to address SLS tolerance; patient education regarding fall risk reduction, gait activities, elliptical    Corean Pouch PTA  07/02/2024 12:13 PM

## 2024-07-09 ENCOUNTER — Other Ambulatory Visit: Payer: Self-pay | Admitting: Internal Medicine

## 2024-07-09 ENCOUNTER — Ambulatory Visit

## 2024-07-09 DIAGNOSIS — R262 Difficulty in walking, not elsewhere classified: Secondary | ICD-10-CM | POA: Diagnosis not present

## 2024-07-09 DIAGNOSIS — M6281 Muscle weakness (generalized): Secondary | ICD-10-CM

## 2024-07-09 NOTE — Therapy (Signed)
 OUTPATIENT PHYSICAL THERAPY TREATMENT NOTE   Patient Name: Sara [name] MRN: [id number] DOB:1950-12-19, 73 y.o., female Today's Date: 07/09/2024  END OF SESSION:  PT End of Session - 07/09/24 1042     Visit Number 5    Number of Visits 16    Date for PT Re-Evaluation 07/30/24    Authorization Type HTA    PT Start Time 1045    PT Stop Time 1123    PT Time Calculation (min) 38 min    Activity Tolerance Patient tolerated treatment well    Behavior During Therapy WFL for tasks assessed/performed          Past Medical History:  Diagnosis Date   Anemia    as child   ANXIETY    Cataract    bilateral-removed   Esophageal stricture    GERD (gastroesophageal reflux disease)    Osteoporosis    PLANTAR FASCIITIS, BILATERAL    PONV (postoperative nausea and vomiting)    SUPRAVENTRICULAR TACHYCARDIA    Urge incontinence    Past Surgical History:  Procedure Laterality Date   Adenosine Myoview  03/14/05   Cather ablation  04/2007   EYE SURGERY Bilateral 10-2010   cataracts with lens replacments   HERNIA REPAIR  15 yrs ago   I & D EXTREMITY Left 02/26/2023   Procedure: IRRIGATION AND DEBRIDEMENT LEFT INDEX FINGER;  Surgeon: Lorretta Dess, MD;  Location: [facility] OR;  Service: Plastics;  Laterality: Left;   NECK LIFT  05/2014   plastic  surgery for a neck lift   VAGINAL HYSTERECTOMY  yrs ago   Patient Active Problem List   Diagnosis Date Noted   Chronic knee pain 06/04/2024   Osteoporosis 12/24/2021   Bunion 04/17/2015   Hammer toe of left foot 04/17/2015   Routine general medical examination at a health care facility 01/13/2013   GERD (gastroesophageal reflux disease) 12/04/2012   Urge incontinence 03/01/2009    PCP: Rollene Almarie LABOR, MD   REFERRING PROVIDER: Rollene Almarie LABOR, MD   REFERRING DIAG:  R26.89 (ICD-10-CM) - Balance disorder  [phone] (ICD-10-CM) - Chronic knee pain, unspecified laterality    THERAPY DIAG:  Difficulty in walking, not  elsewhere classified  Muscle weakness (generalized)  Rationale for Evaluation and Treatment: Rehabilitation  ONSET DATE: 06/01/2024 date of referral  SUBJECTIVE:   SUBJECTIVE STATEMENT: Patient reports that she felt ok after last session.   EVALS: Patient reports to PT today d/t challenges with her balance. She had 2 falls in the past year d/t walking in the rain or mud. She reports that she's noticed difficulties with walking in a straight line and is worried about future falls.   balance, strength, endurance   PERTINENT HISTORY: Relevant PMHx includes anxiety, osteoporosis, BIL plantar fasciitis, supraventricular tachycardia, chronic knee pain, L hammer toe   PAIN:  Are you having pain? No   PRECAUTIONS: None  RED FLAGS: None   WEIGHT BEARING RESTRICTIONS: No  FALLS:  Has patient fallen in last 6 months? Yes. Number of falls 1  LIVING ENVIRONMENT: Lives with: lives with their family Lives in: House/apartment Stairs: Yes: Internal: 1 flight steps with railing and External: 5 steps railing Has following equipment at home: shower chair and Grab bars  OCCUPATION: retired; Conservator, museum/gallery   PLOF: Independent  PATIENT GOALS: to improve balance, decrease risk of falls  NEXT MD VISIT: 06/07/2024 with PCP  OBJECTIVE:  Note: Objective measures were completed at Evaluation unless otherwise noted.   PATIENT SURVEYS:   LEFS  46/80  PSFS score: 2 (average)   1. Walk in straight line - 2  2. Balance (worried about falling) - 2  3. Arm Strength - 2    COGNITION: Overall cognitive status: Within functional limits for tasks assessed      POSTURE: rounded shoulders and forward head   LOWER EXTREMITY ROM:  Active ROM Right eval Left eval  Hip flexion    Hip extension    Hip abduction    Hip adduction    Hip internal rotation    Hip external rotation    Knee flexion    Knee extension    Ankle dorsiflexion    Ankle plantarflexion    Ankle inversion    Ankle  eversion     (Blank rows = not tested)  LOWER EXTREMITY MMT:  MMT Right eval Left eval  Hip flexion 4 4  Hip extension    Hip abduction 4 4  Hip adduction 4 4  Hip internal rotation    Hip external rotation    Knee flexion 5 5  Knee extension 5 5  Ankle dorsiflexion    Ankle plantarflexion    Ankle inversion    Ankle eversion     (Blank rows = not tested)   FUNCTIONAL TESTS:   Balance    Romberg EO on floor  30s  Romberg EC on floor 30s  Romberg EO on airex 30s  Romberg EC on airex        20s    Balance Right Left   Tandem on floor 12s 15s  SLS on floor  4s 3 s                                                                                                                              TREATMENT DATE:  [facility] Adult PT Treatment:                                                DATE: 07/09/24 Therapeutic Exercise: Nustep level 6 x 8 mins while gathering subjective and planning session with patient Standing heel/toe raises 2x10 Standing hip abduction 2x10 BIL Standing hip extension 2x10 BIL Neuromuscular re-ed: FT EO on airex x30 with head turns FT EC on airex x30 Cone weaving (4) cones to single LE cone step overs  Walking on fold out gym mat fwd and with obstacles underneath mat- CGA-minA  and occasional therapist assist to recover LOB   [facility] Adult PT Treatment:                                                DATE: 07/02/24 Therapeutic Exercise: Nustep level 6 x 8 mins while gathering subjective  and planning session with patient Standing heel/toe raises 2x10 Standing hip abduction x10 BIL Standing hip extension x10 BIL Neuromuscular re-ed: FT EO on airex x30 no UE FT EC on airex x30 Tandem walking at counter fwd x3 laps UE support PRN Hurdle step over x4 hurdles fwd/lat step to and step over step pattern with dual task of counting down from 100 by twos Hurdles (4) with airex in middle, fwd step overs - cues to decrease circumduction and speed STS on airex  2x5 - cues to decrease valgus collapse   [facility] Adult PT Treatment:                                                DATE: 06/22/24 Therapeutic Exercise: Nustep level 5 x 8 mins while gathering subjective and planning session with patient Standing heel/toe raises 2x10 Standing hip abduction 2x10 BIL Standing hip extension 2x10 BIL Neuromuscular re-ed: Standing marching 2x30 single UE PRN Standing marching on Airex 2x30 single UE support SLS cone taps x3 cones 2x30 BIL with single UE support moving to fingertip support FT EO on airex x30 no UE with head turns FT EC on airex x30 Semi tandem on airex x30 BIL no UE Tandem walking at counter fwd x3 laps UE support PRN Hurdle step over x4 hurdles fwd/lat step to and step over step pattern STS on airex 2x5   PATIENT EDUCATION:  Education details: reviewed initial home exercise program; discussion of POC, prognosis and goals for skilled PT   Person educated: Patient Education method: Explanation, Demonstration, and Handouts Education comprehension: verbalized understanding, returned demonstration, and needs further education  HOME EXERCISE PROGRAM: Access Code: [email or web address] URL: [email or web address] Date: 07/111/2025 Prepared by: Marko Molt  Exercises - Standing Hip Abduction with Counter Support  - 1 x daily - 3 x weekly - 2 sets - 10 reps - Standing Hip Extension with Counter Support  - 1 x daily - 3 x weekly - 2 sets - 10 reps - 3 sec hold - Standing March with Counter Support  - 1 x daily - 3 x weekly - 1 sets - 15 reps - 3 sec hold - Tandem Stance in Corner  - 1 x daily - 3 x weekly - 3 sets  ASSESSMENT:  CLINICAL IMPRESSION: Patient presents to PT reporting continued compliance with HEP and no new falls since last session. Session today continued to focus on LE strengthening, balance tasks including dual tasking and navigating obstacles and uneven surfaces. She had the most difficulty with obstacles under the  gym mat, needing up to minA to correct LOB. Patient was able to tolerate all prescribed exercises with no adverse effects. Patient continues to benefit from skilled PT services and should be progressed as able to improve functional independence.   EVAL: Love is a 73 y.o. female who was seen today for physical therapy evaluation and treatment d/t impaired balance and history of falls. She is demonstrating decreased LE strength, diminished tandem and SLS balance, and difficulty ambulating in narrow base of support or with eyes closed. She has related fear of falls. She requires skilled PT services at this time to address relevant deficits and improve overall function.     OBJECTIVE IMPAIRMENTS: decreased balance, difficulty walking, and decreased strength.   ACTIVITY LIMITATIONS: carrying, lifting, and stairs  PARTICIPATION LIMITATIONS: community activity  PERSONAL FACTORS: Age, Fitness, Past/current  experiences, and 3+ comorbidities: Relevant PMHx includes anxiety, osteoporosis, BIL plantar fasciitis, supraventricular tachycardia, chronic knee pain, L hammer toe  are also affecting patient's functional outcome.   REHAB POTENTIAL: Fair    CLINICAL DECISION MAKING: Stable/uncomplicated  EVALUATION COMPLEXITY: Low   GOALS: Goals reviewed with patient? YES  SHORT TERM GOALS: Target date: 07/02/2024   Patient will be independent with initial home program at least 3 days/week.  Baseline: provided at eval Goal Status: MET Pt reports adherence 07/02/24   2.  Patient will demonstrate improved postural awareness for at least 15 minutes while standing without need for cueing from PT.  Baseline: see objective measures Goal Status: MET   3.  Patient will demonstrate improved LE strength to at least 4+/5 MMT BIL Baseline: see objective measures Goal status: Ongoing  4.  Patient will demonstrate ability to maintain Romberg on airex foam pad with eyes closed for 30 seconds.  Baseline: 20  sec Goal status: MET 07/02/24: Pt able to maintain balance for 30 seconds   LONG TERM GOALS: Target date: 07/30/2024   Patient will report improved overall functional ability with LEFS score of 58 or greater.  Baseline: 46 Goal Status: INITIAL    2.  Patient will demonstrate ability to maintain tandem stance for at least 20 seconds.  Baseline: see objective measures Goal status: INITIAL  3.  Patient will demonstrate ability to maintain SLS for at least 15 seconds on each LE.  Baseline: see objective measures Goal status: INITIAL  4.  Patient will self-reported increased PSFS score for balance/fear of falling to 6/10 or greater.  Baseline: 2/10 Goal status: INITIAL  5.  Patient will increase FGA score to 24 or greater in order to indicate low risk of falls  Baseline: 19 Goal status: INITIAL    PLAN:  PT FREQUENCY: 1-2x/week  PT DURATION: 8 weeks  PLANNED INTERVENTIONS: 97164- PT Re-evaluation, 97750- Physical Performance Testing, 97110-Therapeutic exercises, 97530- Therapeutic activity, 97112- Neuromuscular re-education, 97535- Self Care, 02859- Manual therapy, [phone]- Aquatic Therapy, Patient/Family education, Balance training, and Stair training  PLAN FOR NEXT SESSION: address static standing balance with narrow BOS and uneven surfaces, standing marches and cone taps to address SLS tolerance; patient education regarding fall risk reduction, gait activities, elliptical    Corean Pouch PTA  07/09/2024 11:23 AM

## 2024-07-14 ENCOUNTER — Ambulatory Visit

## 2024-07-21 ENCOUNTER — Ambulatory Visit

## 2024-08-03 DIAGNOSIS — H26491 Other secondary cataract, right eye: Secondary | ICD-10-CM | POA: Diagnosis not present

## 2024-08-03 DIAGNOSIS — H524 Presbyopia: Secondary | ICD-10-CM | POA: Diagnosis not present

## 2024-08-03 DIAGNOSIS — H26493 Other secondary cataract, bilateral: Secondary | ICD-10-CM | POA: Diagnosis not present

## 2024-08-27 ENCOUNTER — Other Ambulatory Visit: Payer: PPO

## 2024-08-31 ENCOUNTER — Ambulatory Visit (HOSPITAL_BASED_OUTPATIENT_CLINIC_OR_DEPARTMENT_OTHER)
Admission: RE | Admit: 2024-08-31 | Discharge: 2024-08-31 | Disposition: A | Discharge status: Home / Self Care | Source: Ambulatory Visit | Attending: Obstetrics & Gynecology | Admitting: Obstetrics & Gynecology

## 2024-08-31 DIAGNOSIS — H26493 Other secondary cataract, bilateral: Secondary | ICD-10-CM | POA: Diagnosis not present

## 2024-08-31 DIAGNOSIS — M810 Age-related osteoporosis without current pathological fracture: Secondary | ICD-10-CM | POA: Diagnosis not present

## 2024-08-31 DIAGNOSIS — M8589 Other specified disorders of bone density and structure, multiple sites: Secondary | ICD-10-CM | POA: Diagnosis not present

## 2024-08-31 DIAGNOSIS — Z780 Asymptomatic menopausal state: Secondary | ICD-10-CM | POA: Diagnosis not present

## 2024-09-10 DIAGNOSIS — M810 Age-related osteoporosis without current pathological fracture: Secondary | ICD-10-CM | POA: Diagnosis not present

## 2024-10-04 ENCOUNTER — Encounter: Payer: Self-pay | Admitting: Internal Medicine

## 2024-10-05 NOTE — Telephone Encounter (Signed)
 Pt scheduled for 11/13 @140pm  with Corean, FNP. Pt is aware.

## 2024-10-07 ENCOUNTER — Ambulatory Visit: Admitting: Family Medicine

## 2024-10-12 ENCOUNTER — Ambulatory Visit
Admission: EM | Admit: 2024-10-12 | Discharge: 2024-10-12 | Disposition: A | Discharge status: Home / Self Care | Attending: Family Medicine | Admitting: Family Medicine

## 2024-10-12 ENCOUNTER — Other Ambulatory Visit: Payer: Self-pay

## 2024-10-12 ENCOUNTER — Ambulatory Visit (INDEPENDENT_AMBULATORY_CARE_PROVIDER_SITE_OTHER)

## 2024-10-12 ENCOUNTER — Encounter: Payer: Self-pay | Admitting: Emergency Medicine

## 2024-10-12 DIAGNOSIS — R051 Acute cough: Secondary | ICD-10-CM | POA: Diagnosis not present

## 2024-10-12 DIAGNOSIS — J069 Acute upper respiratory infection, unspecified: Secondary | ICD-10-CM

## 2024-10-12 DIAGNOSIS — R059 Cough, unspecified: Secondary | ICD-10-CM | POA: Diagnosis not present

## 2024-10-12 MED ORDER — ALBUTEROL SULFATE HFA 108 (90 BASE) MCG/ACT IN AERS: 2.0000 | INHALATION_SPRAY | RESPIRATORY_TRACT | 0 refills | Status: AC | PRN

## 2024-10-12 NOTE — Discharge Instructions (Signed)
 You were seen today for upper respiratory symptoms.  Your chest xray was normal today.  This is likely viral.   I have sent out an inhaler to help with wheezing.  Please continue the over the counter medications as well.  Return if not improving or worsening.

## 2024-10-12 NOTE — ED Provider Notes (Signed)
 [facility]    CSN: [id number] Arrival date & time: 10/12/24  1340      History   Chief Complaint Chief Complaint  Patient presents with   Cough    HPI Sara [name] is a 73 y.o. female.    Cough  Patient is here for cough and congestion x 1 week.  Started in her sinuses, and now in her chest.  Coughing up  sputum, maybe some wheezing. No fevers/chills.  No sob.  Using otc medications without much help.        Past Medical History:  Diagnosis Date   Anemia    as child   ANXIETY    Cataract    bilateral-removed   Esophageal stricture    GERD (gastroesophageal reflux disease)    Osteoporosis    PLANTAR FASCIITIS, BILATERAL    PONV (postoperative nausea and vomiting)    SUPRAVENTRICULAR TACHYCARDIA    Urge incontinence     Patient Active Problem List   Diagnosis Date Noted   Chronic knee pain 06/04/2024   Osteoporosis 12/24/2021   Bunion 04/17/2015   Hammer toe of left foot 04/17/2015   Routine general medical examination at a health care facility 01/13/2013   GERD (gastroesophageal reflux disease) 12/04/2012   Urge incontinence 03/01/2009    Past Surgical History:  Procedure Laterality Date   Adenosine Myoview  03/14/05   Cather ablation  04/2007   EYE SURGERY Bilateral 10-2010   cataracts with lens replacments   HERNIA REPAIR  15 yrs ago   I & D EXTREMITY Left 02/26/2023   Procedure: IRRIGATION AND DEBRIDEMENT LEFT INDEX FINGER;  Surgeon: Lorretta Dess, MD;  Location: [facility] OR;  Service: Plastics;  Laterality: Left;   NECK LIFT  05/2014   plastic  surgery for a neck lift   VAGINAL HYSTERECTOMY  yrs ago    OB History   No obstetric history on file.      Home Medications    Prior to Admission medications   Medication Sig Start Date End Date Taking? Authorizing Provider  estradiol (ESTRACE) 0.1 MG/GM vaginal cream Place 0.5 g vaginally 2 (two) times a week. 03/19/22   [provider]  famotidine  (PEPCID ) 40 MG tablet TAKE  1 TABLET (40 MG TOTAL) BY MOUTH DAILY. 07/12/24   Rollene Almarie LABOR, MD  Multiple Vitamin (MULTIVITAMIN ADULT PO) Take 1 tablet by mouth daily.    [provider]    Family History Family History  Problem Relation Age of Onset   Arthritis Mother    Hypertension Mother    Coronary artery disease Father    Heart disease Father    Breast cancer Maternal Grandmother    Stroke Paternal Grandmother    Coronary artery disease Paternal Grandfather    Stomach cancer Neg Hx    Colon cancer Neg Hx    Colon polyps Neg Hx    Esophageal cancer Neg Hx    Rectal cancer Neg Hx     Social History Social History   Tobacco Use   Smoking status: Never   Smokeless tobacco: Never  Vaping Use   Vaping status: Never Used  Substance Use Topics   Alcohol use: Yes    Alcohol/week: 1.0 standard drink of alcohol    Types: 1 Glasses of wine per week    Comment: occasionally   Drug use: No     Allergies   Tramadol    Review of Systems Review of Systems  Constitutional: Negative.   HENT:  Positive for congestion.   Respiratory:  Positive for cough.   Cardiovascular: Negative.   Gastrointestinal: Negative.   Genitourinary: Negative.   Musculoskeletal: Negative.   Psychiatric/Behavioral: Negative.       Physical Exam Triage Vital Signs ED Triage Vitals [10/12/24 1353]  Encounter Vitals Group     BP (!) 158/75     Girls Systolic BP Percentile      Girls Diastolic BP Percentile      Boys Systolic BP Percentile      Boys Diastolic BP Percentile      Pulse Rate 65     Resp 18     Temp 98 F (36.7 C)     Temp Source Oral     SpO2 95 %     Weight      Height      Head Circumference      Peak Flow      Pain Score 0     Pain Loc      Pain Education      Exclude from Growth Chart    No data found.  Updated Vital Signs BP (!) 158/75 (BP Location: Left Arm)   Pulse 65   Temp 98 F (36.7 C) (Oral)   Resp 18   SpO2 95%   Visual Acuity Right Eye Distance:   Left  Eye Distance:   Bilateral Distance:    Right Eye Near:   Left Eye Near:    Bilateral Near:     Physical Exam Constitutional:      General: She is not in acute distress.    Appearance: Normal appearance. She is normal weight. She is not ill-appearing or toxic-appearing.  HENT:     Nose: Congestion present. No rhinorrhea.     Mouth/Throat:     Mouth: Mucous membranes are moist.  Cardiovascular:     Rate and Rhythm: Normal rate and regular rhythm.  Pulmonary:     Effort: Pulmonary effort is normal.     Breath sounds: Normal breath sounds. No wheezing, rhonchi or rales.  Musculoskeletal:     Cervical back: Normal range of motion and neck supple.  Skin:    General: Skin is warm.  Neurological:     General: No focal deficit present.     Mental Status: She is alert.  Psychiatric:        Mood and Affect: Mood normal.      UC Treatments / Results  Labs (all labs ordered are listed, but only abnormal results are displayed) Labs Reviewed - No data to display  EKG   Radiology DG Chest 2 View Result Date: 10/12/2024 CLINICAL DATA:  Cough. EXAM: CHEST - 2 VIEW COMPARISON:  Chest radiograph dated 03/19/2024. FINDINGS: No focal consolidation, pleural effusion or pneumothorax. The cardiac silhouette is within normal limits. No acute osseous pathology. IMPRESSION: No active cardiopulmonary disease. Electronically Signed   By: Vanetta Chou M.D.   On: 10/12/2024 14:25    Procedures Procedures (including critical care time)  Medications Ordered in UC Medications - No data to display  Initial Impression / Assessment and Plan / UC Course  I have reviewed the triage vital signs and the nursing notes.  Pertinent labs & imaging results that were available during my care of the patient were reviewed by me and considered in my medical decision making (see chart for details).  Final Clinical Impressions(s) / UC Diagnoses   Final diagnoses:  Acute cough  Acute upper respiratory  infection  Discharge Instructions      You were seen today for upper respiratory symptoms.  Your chest xray was normal today.  This is likely viral.   I have sent out an inhaler to help with wheezing.  Please continue the over the counter medications as well.  Return if not improving or worsening.     ED Prescriptions     Medication Sig Dispense Auth. Provider   albuterol (VENTOLIN HFA) 108 (90 Base) MCG/ACT inhaler Inhale 2 puffs into the lungs every 4 (four) hours as needed for wheezing or shortness of breath. 1 each Darral Longs, MD      PDMP not reviewed this encounter.   Darral Longs, MD 10/12/24 [id number]

## 2024-10-12 NOTE — ED Triage Notes (Signed)
 Pt here for cough and congestion x 6 days with increasing wheezing possibly

## 2024-10-19 ENCOUNTER — Ambulatory Visit (INDEPENDENT_AMBULATORY_CARE_PROVIDER_SITE_OTHER): Admitting: Family Medicine

## 2024-10-19 VITALS — BP 128/78 | HR 64 | Temp 98.3°F | Ht 65.0 in | Wt 168.4 lb

## 2024-10-19 DIAGNOSIS — R300 Dysuria: Secondary | ICD-10-CM | POA: Diagnosis not present

## 2024-10-19 LAB — POCT URINALYSIS DIPSTICK
Bilirubin, UA: NEGATIVE
Blood, UA: NEGATIVE
Glucose, UA: NEGATIVE
Ketones, UA: NEGATIVE
Nitrite, UA: NEGATIVE
Protein, UA: NEGATIVE
Spec Grav, UA: 1.01 (ref 1.010–1.025)
Urobilinogen, UA: 0.2 U/dL
pH, UA: 6 (ref 5.0–8.0)

## 2024-10-19 MED ORDER — CEPHALEXIN 500 MG PO CAPS: 500.0000 mg | ORAL_CAPSULE | Freq: Two times a day (BID) | ORAL | 0 refills | Status: AC

## 2024-10-19 NOTE — Patient Instructions (Signed)
 I have sent in keflex  for you to take twice a day for 5 days. Please eat when you take this medication, it can upset your stomach if you do not. Please be sure to complete the course of antibiotics even if you are feeling better.   Your urine was concerning for infection in the office today. We have sent it to the lab for culture and confirmation.  Follow-up with me for new or worsening symptoms.

## 2024-10-19 NOTE — Progress Notes (Signed)
 Acute Office Visit  Subjective:     Patient ID: Sara [name], female    DOB: 07-May-1951, 73 y.o.   MRN: [id number]  Chief Complaint  Patient presents with   Hematuria    Onset 6 weeks ago  Some days I have the bleeding and then some I don't. It started as a stain in my pads and then I saw solid blood. I'm assuming its in my urine but not sure. I do have an appointment with GYN later in the year  Pt was seen in UC for a cough and would like her lungs listened to     HPI  Discussed the use of AI scribe software for clinical note transcription with the patient, who gave verbal consent to proceed.  History of Present Illness Sara [name] is a 73 year old female who presents with possible urinary tract infection and respiratory symptoms.  Lower urinary tract symptoms - Slight discomfort in the lower abdomen and lower back - Concern for possible urinary tract infection - Observed a small red stain, uncertain if it was urine or another substance - No urinary tract infection in the past 35-40 years  Respiratory symptoms - Recent sore throat resolved the next day - Congestion following sore throat - Felt unwell and remained in bed for a day and a half - Uses albuterol  inhaler three times daily, which alleviates symptoms - No fever  Medication reactions and history - Experienced adverse reaction to tramadol , described as feeling 'yucky' - No antibiotic use since March of the previous year     ROS Per HPI      Objective:    BP 128/78 (BP Location: Left Arm, Patient Position: Sitting, Cuff Size: Normal)   Pulse 64   Temp 98.3 F (36.8 C) (Oral)   Ht 5' 5 (1.651 m)   Wt 168 lb 6.4 oz (76.4 kg)   SpO2 94%   BMI 28.02 kg/m    Physical Exam Vitals and nursing note reviewed.  Constitutional:      General: She is not in acute distress.    Appearance: Normal appearance. She is normal weight.  HENT:     Head: Normocephalic and atraumatic.     Right Ear:  External ear normal.     Left Ear: External ear normal.     Nose: Congestion present.     Mouth/Throat:     Mouth: Mucous membranes are moist.     Comments: Oropharyngeal cobblestoning   Eyes:     Extraocular Movements: Extraocular movements intact.     Pupils: Pupils are equal, round, and reactive to light.  Cardiovascular:     Rate and Rhythm: Normal rate and regular rhythm.     Pulses: Normal pulses.     Heart sounds: Normal heart sounds.  Pulmonary:     Effort: Pulmonary effort is normal. No respiratory distress.     Breath sounds: Normal breath sounds. No wheezing, rhonchi or rales.     Comments: Dry cough in office Musculoskeletal:        General: Normal range of motion.     Cervical back: Normal range of motion.     Right lower leg: No edema.     Left lower leg: No edema.  Lymphadenopathy:     Cervical: No cervical adenopathy.  Neurological:     General: No focal deficit present.     Mental Status: She is alert and oriented to person, place, and time.  Psychiatric:  Mood and Affect: Mood normal.        Thought Content: Thought content normal.     Results for orders placed or performed in visit on 10/19/24  POCT Urinalysis Dipstick  Result Value Ref Range   Color, UA Yellow    Clarity, UA Clear    Glucose, UA Negative Negative   Bilirubin, UA Negative    Ketones, UA Negative    Spec Grav, UA 1.010 1.010 - 1.025   Blood, UA Negative    pH, UA 6.0 5.0 - 8.0   Protein, UA Negative Negative   Urobilinogen, UA 0.2 0.2 or 1.0 E.U./dL   Nitrite, UA Negative    Leukocytes, UA Moderate (2+) (A) Negative   Appearance     Odor          Assessment & Plan:   Assessment and Plan Assessment & Plan Dysuria Possible UTI with lower abdominal discomfort and intermittent hematuria. Urinalysis suggests infection. Differential includes dehydration. - Sent urine for culture. - Prescribed Keflex  500 mg twice daily for 5 days. - Advised to start antibiotics if  symptoms worsen. - Will review culture results by Thursday or Friday and adjust treatment if necessary.  Bacterial URI Recent respiratory symptoms improved with albuterol . No fever or significant distress. - Continue albuterol  inhaler as needed. - Given timing of illness, 2 weeks, will go ahead and treat with keflex  as well - Monitor symptoms and adjust treatment if necessary.     Orders Placed This Encounter  Procedures   Urine Culture    Standing Status:   Future    Expiration Date:   10/19/2025   POCT Urinalysis Dipstick     Meds ordered this encounter  Medications   cephALEXin  (KEFLEX ) 500 MG capsule    Sig: Take 1 capsule (500 mg total) by mouth 2 (two) times daily for 5 days.    Dispense:  10 capsule    Refill:  0    Return if symptoms worsen or fail to improve.  Corean LITTIE Ku, FNP

## 2024-10-20 ENCOUNTER — Ambulatory Visit: Payer: Self-pay | Admitting: Family Medicine

## 2024-10-20 LAB — URINE CULTURE: Result:: NO GROWTH

## 2024-11-01 DIAGNOSIS — N765 Ulceration of vagina: Secondary | ICD-10-CM | POA: Diagnosis not present

## 2024-11-01 DIAGNOSIS — N760 Acute vaginitis: Secondary | ICD-10-CM | POA: Diagnosis not present

## 2024-12-07 ENCOUNTER — Telehealth: Payer: Self-pay

## 2024-12-07 NOTE — Telephone Encounter (Signed)
 Copied from CRM [id number]. Topic: Appointments - Appointment Scheduling >> Dec 07, 2024  1:10 PM Darshell M wrote: Patient/patient representative is calling to schedule an appointment. Refer to attachments for appointment information. >> Dec 07, 2024  1:32 PM Darshell M wrote: Pt is requesting to transfer FROM: Almarie Cleveland Pt is requesting to transfer TO: [name] Reason for requested transfer: Distance It is the responsibility of the team the patient would like to transfer to (Dr. Sacks) to reach out to the patient if for any reason this transfer is not acceptable.

## 2024-12-07 NOTE — Telephone Encounter (Signed)
 Copied from CRM [id number]. Topic: Appointments - Appointment Scheduling >> Dec 07, 2024  1:10 PM Darshell M wrote: Patient/patient representative is calling to schedule an appointment. Refer to attachments for appointment information.   Appointment made.

## 2024-12-07 NOTE — Telephone Encounter (Signed)
 Copied from CRM [id number]. Topic: Appointments - Appointment Scheduling >> Dec 07, 2024  1:32 PM Darshell M wrote: Pt is requesting to transfer FROM: Sara [name] Pt is requesting to transfer TO: [name] Reason for requested transfer: Distance It is the responsibility of the team the patient would like to transfer to (Dr. Sacks) to reach out to the patient if for any reason this transfer is not acceptable.   Appointment has been made.

## 2024-12-08 NOTE — Telephone Encounter (Signed)
 Does not need approval to [facility] unless new provider not taking new patients then only the new provider would need to be asked.

## 2024-12-30 ENCOUNTER — Ambulatory Visit
Admission: EM | Admit: 2024-12-30 | Discharge: 2024-12-30 | Disposition: A | Discharge status: Home / Self Care | Source: Home / Self Care

## 2024-12-30 ENCOUNTER — Ambulatory Visit (INDEPENDENT_AMBULATORY_CARE_PROVIDER_SITE_OTHER)

## 2024-12-30 DIAGNOSIS — R0789 Other chest pain: Secondary | ICD-10-CM

## 2024-12-30 DIAGNOSIS — S2232XA Fracture of one rib, left side, initial encounter for closed fracture: Secondary | ICD-10-CM

## 2024-12-30 NOTE — ED Triage Notes (Signed)
 Sara [name] slid on the ice last week Wednesday. She is experiencing left side pain x 1 week around bra line, she fell on pocketbook painful with movement. Treated with heat and Advil.

## 2024-12-30 NOTE — Discharge Instructions (Signed)
 Return if any problems.

## 2024-12-30 NOTE — ED Provider Notes (Signed)
 " [facility]    CSN: [id number] Arrival date & time: 12/30/24  1621      History   Chief Complaint No chief complaint on file.   HPI Sara [name] is a 74 y.o. female.   Patient reports she fell over a week ago onto her purse.  Patient states that she hit the left side of her chest.  Patient states she has had pain in the left side of her chest since the fall.  Patient states that she has pain when she takes a deep breath she has pain when she moves.  Patient denies any other area of injury she did not have any impact of her head she did not have any loss of consciousness.  Patient denies any abdominal pain  The history is provided by the patient. No language interpreter was used.    Past Medical History:  Diagnosis Date   Anemia    as child   ANXIETY    Cataract    bilateral-removed   Esophageal stricture    GERD (gastroesophageal reflux disease)    Osteoporosis    PLANTAR FASCIITIS, BILATERAL    PONV (postoperative nausea and vomiting)    SUPRAVENTRICULAR TACHYCARDIA    Urge incontinence     Patient Active Problem List   Diagnosis Date Noted   Chronic knee pain 06/04/2024   Osteoporosis 12/24/2021   Bunion 04/17/2015   Hammer toe of left foot 04/17/2015   Routine general medical examination at a health care facility 01/13/2013   GERD (gastroesophageal reflux disease) 12/04/2012   Urge incontinence 03/01/2009    Past Surgical History:  Procedure Laterality Date   Adenosine Myoview  03/14/05   Cather ablation  04/2007   EYE SURGERY Bilateral 10-2010   cataracts with lens replacments   HERNIA REPAIR  15 yrs ago   I & D EXTREMITY Left 02/26/2023   Procedure: IRRIGATION AND DEBRIDEMENT LEFT INDEX FINGER;  Surgeon: Lorretta Dess, MD;  Location: [facility] OR;  Service: Plastics;  Laterality: Left;   NECK LIFT  05/2014   plastic  surgery for a neck lift   VAGINAL HYSTERECTOMY  yrs ago    OB History   No obstetric history on file.      Home  Medications    Prior to Admission medications  Medication Sig Start Date End Date Taking? Authorizing Provider  albuterol  (VENTOLIN  HFA) 108 (90 Base) MCG/ACT inhaler Inhale 2 puffs into the lungs every 4 (four) hours as needed for wheezing or shortness of breath. 10/12/24   Piontek, Rocky, MD  estradiol (ESTRACE) 0.1 MG/GM vaginal cream Place 0.5 g vaginally 2 (two) times a week. 03/19/22   [provider]  famotidine  (PEPCID ) 40 MG tablet TAKE 1 TABLET (40 MG TOTAL) BY MOUTH DAILY. 07/12/24   Rollene Almarie LABOR, MD  Multiple Vitamin (MULTIVITAMIN ADULT PO) Take 1 tablet by mouth daily.    [provider]    Family History Family History  Problem Relation Age of Onset   Arthritis Mother    Hypertension Mother    Coronary artery disease Father    Heart disease Father    Breast cancer Maternal Grandmother    Stroke Paternal Grandmother    Coronary artery disease Paternal Grandfather    Stomach cancer Neg Hx    Colon cancer Neg Hx    Colon polyps Neg Hx    Esophageal cancer Neg Hx    Rectal cancer Neg Hx     Social History Social History[1]  Allergies   Tramadol    Review of Systems Review of Systems  All other systems reviewed and are negative.    Physical Exam Triage Vital Signs ED Triage Vitals  Encounter Vitals Group     BP 12/30/24 1645 139/75     Girls Systolic BP Percentile --      Girls Diastolic BP Percentile --      Boys Systolic BP Percentile --      Boys Diastolic BP Percentile --      Pulse Rate 12/30/24 1645 73     Resp 12/30/24 1645 16     Temp 12/30/24 1645 98.3 F (36.8 C)     Temp Source 12/30/24 1645 Oral     SpO2 12/30/24 1645 100 %     Weight 12/30/24 1644 162 lb (73.5 kg)     Height 12/30/24 1644 5' 5 (1.651 m)     Head Circumference --      Peak Flow --      Pain Score 12/30/24 1644 7     Pain Loc --      Pain Education --      Exclude from Growth Chart --    No data found.  Updated Vital Signs BP 139/75 (BP  Location: Left Arm)   Pulse 73   Temp 98.3 F (36.8 C) (Oral)   Resp 16   Ht 5' 5 (1.651 m)   Wt 73.5 kg   SpO2 100%   BMI 26.96 kg/m   Visual Acuity Right Eye Distance:   Left Eye Distance:   Bilateral Distance:    Right Eye Near:   Left Eye Near:    Bilateral Near:     Physical Exam Vitals and nursing note reviewed.  Constitutional:      Appearance: She is well-developed.  HENT:     Head: Normocephalic.  Cardiovascular:     Rate and Rhythm: Normal rate.     Comments: Tender left anterior rib, near her bra line, Pulmonary:     Effort: Pulmonary effort is normal.     Breath sounds: Normal breath sounds.     Comments: Lungs clear Abdominal:     General: There is no distension.  Musculoskeletal:        General: Normal range of motion.  Skin:    General: Skin is warm.  Neurological:     General: No focal deficit present.     Mental Status: She is alert and oriented to person, place, and time.      UC Treatments / Results  Labs (all labs ordered are listed, but only abnormal results are displayed) Labs Reviewed - No data to display  EKG   Radiology DG Ribs Unilateral W/Chest Left Result Date: 12/30/2024 CLINICAL DATA:  Clemens last week on ice, left sided pain EXAM: LEFT RIBS AND CHEST - 3+ VIEW COMPARISON:  10/12/2024 FINDINGS: Frontal view of the chest as well as frontal and oblique views of the left thoracic cage are obtained. Cardiac silhouette is unremarkable. There is a small hiatal hernia. No acute airspace disease, effusion, or pneumothorax. There is a minimally displaced left anterior eighth rib fracture near the costochondral junction. No other acute bony abnormalities. IMPRESSION: 1. Minimally displaced left anterior eighth rib fracture. 2. No acute intrathoracic process.  No effusion or pneumothorax. 3. Hiatal hernia. Electronically Signed   By: Ozell Daring M.D.   On: 12/30/2024 17:40    Procedures Procedures (including critical care  time)  Medications Ordered in UC Medications -  No data to display  Initial Impression / Assessment and Plan / UC Course  I have reviewed the triage vital signs and the nursing notes.  Pertinent labs & imaging results that were available during my care of the patient were reviewed by me and considered in my medical decision making (see chart for details).     Chest x-ray shows left eighth rib fracture.  Patient is counseled on rib fractures.  Patient states that she has been taking Tylenol .  Patient is advised to follow-up with her primary care physician for recheck in 1 week.  Patient is advised to take deep breaths.  She is advised to return if symptoms worsen or change Final Clinical Impressions(s) / UC Diagnoses   Final diagnoses:  Rib pain on left side  Closed fracture of one rib of left side, initial encounter     Discharge Instructions      Return if any problems.     ED Prescriptions   None    PDMP not reviewed this encounter. An After Visit Summary was printed and given to the patient.         [1]  Social History Tobacco Use   Smoking status: Never   Smokeless tobacco: Never  Vaping Use   Vaping status: Never Used  Substance Use Topics   Alcohol use: Yes    Alcohol/week: 1.0 standard drink of alcohol    Types: 1 Glasses of wine per week    Comment: occasionally   Drug use: No     Flint Sonny POUR, PA-C 12/31/24 [id number]  "

## 2025-01-19 ENCOUNTER — Encounter

## 2025-01-20 ENCOUNTER — Encounter

## 2025-03-11 ENCOUNTER — Ambulatory Visit

## 2025-03-14 ENCOUNTER — Ambulatory Visit
